# Patient Record
Sex: Female | Born: 1965
Health system: Southern US, Community
[De-identification: ages and names within clinical notes are randomized; demographics above are authoritative.]

## PROBLEM LIST (undated history)

## (undated) DIAGNOSIS — E7800 Pure hypercholesterolemia, unspecified: Secondary | ICD-10-CM

## (undated) DIAGNOSIS — R079 Chest pain, unspecified: Secondary | ICD-10-CM

## (undated) DIAGNOSIS — Z8739 Personal history of other diseases of the musculoskeletal system and connective tissue: Secondary | ICD-10-CM

## (undated) DIAGNOSIS — F419 Anxiety disorder, unspecified: Secondary | ICD-10-CM

## (undated) DIAGNOSIS — M542 Cervicalgia: Secondary | ICD-10-CM

## (undated) DIAGNOSIS — Z720 Tobacco use: Secondary | ICD-10-CM

## (undated) DIAGNOSIS — G8929 Other chronic pain: Secondary | ICD-10-CM

## (undated) DIAGNOSIS — F329 Major depressive disorder, single episode, unspecified: Secondary | ICD-10-CM

## (undated) DIAGNOSIS — F32A Depression, unspecified: Secondary | ICD-10-CM

## (undated) DIAGNOSIS — M5416 Radiculopathy, lumbar region: Secondary | ICD-10-CM

## (undated) DIAGNOSIS — M549 Dorsalgia, unspecified: Secondary | ICD-10-CM

## (undated) HISTORY — PX: BACK SURGERY: SHX140

## (undated) HISTORY — PX: TUBAL LIGATION: SHX77

## (undated) HISTORY — DX: Chest pain, unspecified: R07.9

---

## 1998-05-10 ENCOUNTER — Ambulatory Visit (HOSPITAL_COMMUNITY): Admission: RE | Admit: 1998-05-10 | Discharge: 1998-05-10 | Payer: Self-pay | Admitting: Gynecology

## 1999-08-05 ENCOUNTER — Other Ambulatory Visit: Admission: RE | Admit: 1999-08-05 | Discharge: 1999-08-05 | Payer: Self-pay | Admitting: Gynecology

## 2000-12-05 ENCOUNTER — Ambulatory Visit (HOSPITAL_COMMUNITY): Admission: RE | Admit: 2000-12-05 | Discharge: 2000-12-05 | Payer: Self-pay | Admitting: Neurosurgery

## 2002-06-10 ENCOUNTER — Encounter: Payer: Self-pay | Admitting: Emergency Medicine

## 2002-06-10 ENCOUNTER — Emergency Department (HOSPITAL_COMMUNITY): Admission: EM | Admit: 2002-06-10 | Discharge: 2002-06-10 | Payer: Self-pay | Admitting: Emergency Medicine

## 2002-10-05 ENCOUNTER — Encounter: Payer: Self-pay | Admitting: Family Medicine

## 2002-10-05 ENCOUNTER — Ambulatory Visit (HOSPITAL_COMMUNITY): Admission: RE | Admit: 2002-10-05 | Discharge: 2002-10-05 | Payer: Self-pay | Admitting: Family Medicine

## 2003-06-15 ENCOUNTER — Encounter: Payer: Self-pay | Admitting: Family Medicine

## 2003-06-15 ENCOUNTER — Ambulatory Visit (HOSPITAL_COMMUNITY): Admission: RE | Admit: 2003-06-15 | Discharge: 2003-06-15 | Payer: Self-pay | Admitting: Family Medicine

## 2003-06-19 ENCOUNTER — Other Ambulatory Visit: Admission: RE | Admit: 2003-06-19 | Discharge: 2003-06-19 | Payer: Self-pay | Admitting: Gynecology

## 2003-10-18 ENCOUNTER — Emergency Department (HOSPITAL_COMMUNITY): Admission: EM | Admit: 2003-10-18 | Discharge: 2003-10-18 | Payer: Self-pay | Admitting: Emergency Medicine

## 2004-02-04 ENCOUNTER — Emergency Department (HOSPITAL_COMMUNITY): Admission: EM | Admit: 2004-02-04 | Discharge: 2004-02-04 | Payer: Self-pay | Admitting: Emergency Medicine

## 2005-12-28 ENCOUNTER — Emergency Department (HOSPITAL_COMMUNITY): Admission: EM | Admit: 2005-12-28 | Discharge: 2005-12-28 | Payer: Self-pay | Admitting: Emergency Medicine

## 2006-12-28 ENCOUNTER — Ambulatory Visit (HOSPITAL_COMMUNITY): Admission: RE | Admit: 2006-12-28 | Discharge: 2006-12-28 | Payer: Self-pay | Admitting: Family Medicine

## 2007-11-11 ENCOUNTER — Ambulatory Visit (HOSPITAL_COMMUNITY): Admission: RE | Admit: 2007-11-11 | Discharge: 2007-11-11 | Payer: Self-pay | Admitting: Internal Medicine

## 2008-06-17 ENCOUNTER — Emergency Department (HOSPITAL_COMMUNITY): Admission: EM | Admit: 2008-06-17 | Discharge: 2008-06-17 | Payer: Self-pay | Admitting: Emergency Medicine

## 2008-06-25 ENCOUNTER — Emergency Department (HOSPITAL_COMMUNITY): Admission: EM | Admit: 2008-06-25 | Discharge: 2008-06-25 | Payer: Self-pay | Admitting: Emergency Medicine

## 2008-07-17 ENCOUNTER — Emergency Department (HOSPITAL_COMMUNITY): Admission: EM | Admit: 2008-07-17 | Discharge: 2008-07-17 | Payer: Self-pay | Admitting: Emergency Medicine

## 2008-07-25 ENCOUNTER — Emergency Department (HOSPITAL_COMMUNITY): Admission: EM | Admit: 2008-07-25 | Discharge: 2008-07-25 | Payer: Self-pay | Admitting: Emergency Medicine

## 2008-07-27 ENCOUNTER — Emergency Department (HOSPITAL_COMMUNITY): Admission: EM | Admit: 2008-07-27 | Discharge: 2008-07-27 | Payer: Self-pay | Admitting: Emergency Medicine

## 2008-07-31 ENCOUNTER — Ambulatory Visit (HOSPITAL_COMMUNITY): Admission: RE | Admit: 2008-07-31 | Discharge: 2008-07-31 | Payer: Self-pay | Admitting: Internal Medicine

## 2008-08-07 ENCOUNTER — Ambulatory Visit (HOSPITAL_COMMUNITY): Admission: RE | Admit: 2008-08-07 | Discharge: 2008-08-08 | Payer: Self-pay | Admitting: Neurological Surgery

## 2009-03-08 ENCOUNTER — Ambulatory Visit (HOSPITAL_COMMUNITY): Admission: RE | Admit: 2009-03-08 | Discharge: 2009-03-08 | Payer: Self-pay | Admitting: Obstetrics & Gynecology

## 2009-03-20 ENCOUNTER — Other Ambulatory Visit: Admission: RE | Admit: 2009-03-20 | Discharge: 2009-03-20 | Payer: Self-pay | Admitting: Obstetrics & Gynecology

## 2009-12-22 ENCOUNTER — Emergency Department (HOSPITAL_COMMUNITY): Admission: EM | Admit: 2009-12-22 | Discharge: 2009-12-22 | Payer: Self-pay | Admitting: Emergency Medicine

## 2010-03-24 ENCOUNTER — Emergency Department (HOSPITAL_COMMUNITY): Admission: EM | Admit: 2010-03-24 | Discharge: 2010-03-24 | Payer: Self-pay | Admitting: Emergency Medicine

## 2010-07-20 ENCOUNTER — Emergency Department (HOSPITAL_COMMUNITY): Admission: EM | Admit: 2010-07-20 | Discharge: 2010-07-20 | Payer: Self-pay | Admitting: Emergency Medicine

## 2010-11-15 LAB — URINE MICROSCOPIC-ADD ON

## 2010-11-15 LAB — URINALYSIS, ROUTINE W REFLEX MICROSCOPIC
Ketones, ur: NEGATIVE mg/dL
Nitrite: NEGATIVE
Protein, ur: NEGATIVE mg/dL
pH: 6 (ref 5.0–8.0)

## 2010-11-15 LAB — WET PREP, GENITAL: Trich, Wet Prep: NONE SEEN

## 2010-11-18 LAB — URINALYSIS, ROUTINE W REFLEX MICROSCOPIC
Protein, ur: >300 mg/dL — AB
Urobilinogen, UA: 1 mg/dL (ref 0.0–1.0)

## 2010-11-18 LAB — URINE CULTURE

## 2010-11-18 LAB — URINE MICROSCOPIC-ADD ON

## 2011-01-13 NOTE — Op Note (Signed)
NAMEMARGHERITA, [name] NO.:  [id number]   MEDICAL RECORD NO.:  [id number]          PATIENT TYPE:  OIB   LOCATION:  3538                         FACILITY:  [facility]   PHYSICIAN:  Stefani Dama, M.D.  DATE OF BIRTH:  Jan 16, 1966   DATE OF PROCEDURE:  08/07/2008  DATE OF DISCHARGE:                               OPERATIVE REPORT   PREOPERATIVE DIAGNOSIS:  Herniated nucleus pulposus L5-S1 right with  right lumbar radiculopathy.   POSTOPERATIVE DIAGNOSIS:  Herniated nucleus pulposus L5-S1 right with  right lumbar radiculopathy.   PROCEDURE:  Laminotomy L5, microdiskectomy L5-S1 with operating  microscope and microdissection technique.   SURGEON:  Stefani Dama, MD   ANESTHESIA:  General endotracheal.   INDICATIONS:  Katelyn Smith is a 45 year old individual who has had  significant back and right lower extremity pain.  She has a large  extruded fragment of disk at L5-S1 on the right side.  She has failed  efforts at conservative management and has been advised regarding the  diskectomy.   PROCEDURE:  The patient was brought to the operating room supine on the  stretcher.  After smooth induction of general endotracheal anesthesia,  she was turned prone.  Back was prepped with alcohol and DuraPrep and  draped in a sterile fashion.  Midline incision was created and carried  down to the lumbodorsal fascia which was opened on the right side.  A  needle localization of L5-S1 was obtained radiographically and after  this the lumbodorsal fascia was opened and the interlaminar space was  easily palpated at L5-S1.  Self-retaining retractor was then placed in  the wound and a laminotomy was created removing the inferior margin  lamina of L5 out to the medial wall of facet.  The L ligament was taken  up and common dural tube was exposed.  The takeoff of the S1 nerve root  was noted to be bowed dorsally and laterally and underneath this was a  significant mass.  Lateral  aspect of the nerve root was cleared and then  the nerve root could be mobilized.  The fascia that had formed from  dense adhesions was opened and a fragment of disk was extruded from this  area.  Several other fragments were then dissected free and removed and  this allowed good relaxation of the S1 nerve root.  Further dissection  yielded several other fragments of disk material and gradually the  length of the S1 nerve root could be palpated and was decompressed.  The  area of the disk space itself was then palpated and there was noted to  be a fascial closure over this area and the disk space itself could not  be entered.  Further exploration yielded no other fragments of disk and  the nerve root itself was explored both cephalad and caudad and no other  adhesions to the nerve root were identified.  With hemostasis being  achieved, the area was irrigated copiously with antibiotic irrigating  solution.  Microscope was removed from the  field and then the lumbodorsal fascia was closed with #1 Vicryl in  interrupted fashion, 2-0 Vicryl was used in the subcutaneous tissues, 3-  0 Vicryl subcuticularly and Dermabond was placed on the skin.  Blood  loss was nil for this procedure.  The patient tolerated the procedure  well was returned to recovery room in stable condition.      Stefani Dama, M.D.  Electronically Signed     HJE/MEDQ  D:  08/07/2008  T:  08/08/2008  Job:  [id number]

## 2011-06-05 LAB — CBC
MCHC: 35 g/dL (ref 30.0–36.0)
Platelets: 256 10*3/uL (ref 150–400)
RBC: 4.33 MIL/uL (ref 3.87–5.11)
WBC: 10.5 10*3/uL (ref 4.0–10.5)

## 2011-06-07 ENCOUNTER — Emergency Department (HOSPITAL_COMMUNITY): Payer: Self-pay

## 2011-06-07 ENCOUNTER — Encounter: Payer: Self-pay | Admitting: Emergency Medicine

## 2011-06-07 ENCOUNTER — Other Ambulatory Visit: Payer: Self-pay

## 2011-06-07 ENCOUNTER — Emergency Department (HOSPITAL_COMMUNITY)
Admission: EM | Admit: 2011-06-07 | Discharge: 2011-06-07 | Disposition: A | Discharge status: Home / Self Care | Payer: Self-pay | Attending: Emergency Medicine | Admitting: Emergency Medicine

## 2011-06-07 DIAGNOSIS — R079 Chest pain, unspecified: Secondary | ICD-10-CM | POA: Insufficient documentation

## 2011-06-07 DIAGNOSIS — M549 Dorsalgia, unspecified: Secondary | ICD-10-CM | POA: Insufficient documentation

## 2011-06-07 DIAGNOSIS — F17200 Nicotine dependence, unspecified, uncomplicated: Secondary | ICD-10-CM | POA: Insufficient documentation

## 2011-06-07 DIAGNOSIS — J189 Pneumonia, unspecified organism: Secondary | ICD-10-CM | POA: Insufficient documentation

## 2011-06-07 HISTORY — DX: Pure hypercholesterolemia, unspecified: E78.00

## 2011-06-07 HISTORY — DX: Depression, unspecified: F32.A

## 2011-06-07 HISTORY — DX: Major depressive disorder, single episode, unspecified: F32.9

## 2011-06-07 HISTORY — DX: Anxiety disorder, unspecified: F41.9

## 2011-06-07 LAB — CBC
HCT: 39.1 % (ref 36.0–46.0)
MCV: 97 fL (ref 78.0–100.0)
Platelets: 253 10*3/uL (ref 150–400)
RBC: 4.03 MIL/uL (ref 3.87–5.11)
WBC: 9.5 10*3/uL (ref 4.0–10.5)

## 2011-06-07 LAB — CARDIAC PANEL(CRET KIN+CKTOT+MB+TROPI)
CK, MB: 3.4 ng/mL (ref 0.3–4.0)
Relative Index: INVALID (ref 0.0–2.5)
Troponin I: <0.3 ng/mL (ref <0.30)

## 2011-06-07 LAB — COMPREHENSIVE METABOLIC PANEL
AST: 11 U/L (ref 0–37)
Albumin: 3.9 g/dL (ref 3.5–5.2)
Chloride: 102 mEq/L (ref 96–112)
Creatinine, Ser: 0.55 mg/dL (ref 0.50–1.10)
Total Bilirubin: 0.4 mg/dL (ref 0.3–1.2)
Total Protein: 6.6 g/dL (ref 6.0–8.3)

## 2011-06-07 LAB — D-DIMER, QUANTITATIVE: D-Dimer, Quant: 0.27 ug/mL-FEU (ref 0.00–0.48)

## 2011-06-07 MED ORDER — GI COCKTAIL ~~LOC~~
30.0000 mL | Freq: Once | ORAL | Status: AC
  Administered 2011-06-07: 30 mL via ORAL
  Filled 2011-06-07: qty 30

## 2011-06-07 MED ORDER — MORPHINE SULFATE 4 MG/ML IJ SOLN
4.0000 mg | Freq: Once | INTRAMUSCULAR | Status: AC
Start: 2011-06-07 — End: 2011-06-07
  Administered 2011-06-07: 4 mg via INTRAVENOUS
  Filled 2011-06-07: qty 1

## 2011-06-07 MED ORDER — MOXIFLOXACIN HCL 400 MG PO TABS: 400.0000 mg | ORAL_TABLET | Freq: Every day | ORAL | Status: DC

## 2011-06-07 MED ORDER — MORPHINE SULFATE 4 MG/ML IJ SOLN
4.0000 mg | Freq: Once | INTRAMUSCULAR | Status: AC
  Administered 2011-06-07: 4 mg via INTRAVENOUS
  Filled 2011-06-07: qty 1

## 2011-06-07 MED ORDER — MOXIFLOXACIN HCL 400 MG PO TABS: 400.0000 mg | ORAL_TABLET | Freq: Every day | ORAL | Status: AC

## 2011-06-07 NOTE — ED Notes (Signed)
Patient c/o chest pain. Per patient started with back pain x1 hour ago, pain moved into mid chest and radiates into left arm. Patient does report shortness of breath.

## 2011-06-07 NOTE — ED Provider Notes (Signed)
History     CSN: [id number] Arrival date & time: 06/07/2011  3:17 PM  Chief Complaint  Patient presents with  . Chest Pain    (Consider location/radiation/quality/duration/timing/severity/associated sxs/prior treatment) HPI The patient presents 2 hours after the onset of chest pain. The onset was insidious, and she initially felt sharp discomfort radiating from her sternum to her back. Since that time she's had pressure in this area.  She also complains of radiating discomfort to her left shoulder. The symptoms eased somewhat spontaneously, but she still has mild pain in the after mentioned areas. She describes some pain with deep inspiration, no pain with exertion, no lightheadedness, no vomiting, though she is nauseous. The patient is a smoker, and denies any recent travel or OTC use. Past Medical History  Diagnosis Date  . High cholesterol   . Anxiety   . Depression     Past Surgical History  Procedure Date  . Back surgery   . Tubal ligation     Family History  Problem Relation Age of Onset  . Heart failure Mother   . Cancer Father   . Cancer Sister     History  Substance Use Topics  . Smoking status: Current Everyday Smoker -- 0.5 packs/day for 30 years    Types: Cigarettes  . Smokeless tobacco: Never Used  . Alcohol Use: No    OB History    Grav Para Term Preterm Abortions TAB SAB Ect Mult Living   3 3 3       3       Review of Systems Gen: Per HPI HEENT: No HA CV:hpi Resp: No dyspnea Abd: Per HPI, otherwise negative Musk: Per HPI, otherwise negative Neuro: No dysesthesia, or focal changes GU: Per HPI, otherwise negative Skin: Neg Psych: Neg  Allergies  Review of patient's allergies indicates no known allergies.  Home Medications   Current Outpatient Rx  Name Route Sig Dispense Refill  . ALPRAZOLAM 1 MG PO TABS Oral Take 1 mg by mouth 2 (two) times daily.      Marland Kitchen CLONAZEPAM 1 MG PO TABS Oral Take 1 mg by mouth at bedtime.      Marland Kitchen ROPINIROLE HCL  0.25 MG PO TABS Oral Take 0.25 mg by mouth at bedtime.        BP 123/68  Pulse 55  Temp(Src) 98 F (36.7 C) (Oral)  Resp 17  Ht 5\' 3"  (1.6 m)  Wt 145 lb (65.772 kg)  BMI 25.69 kg/m2  SpO2 95%  Physical Exam  Constitutional: She is oriented to person, place, and time. She appears well-developed and well-nourished.  HENT:  Head: Normocephalic and atraumatic.  Eyes: EOM are normal.  Cardiovascular: Normal rate and regular rhythm.   Pulmonary/Chest: Effort normal and breath sounds normal.  Abdominal: She exhibits no distension.  Musculoskeletal: She exhibits no edema and no tenderness.  Neurological: She is alert and oriented to person, place, and time.  Skin: Skin is warm and dry.    ED Course  Procedures (including critical care time)   Labs Reviewed  CBC  CARDIAC PANEL(CRET KIN+CKTOT+MB+TROPI)  COMPREHENSIVE METABOLIC PANEL  MAGNESIUM  D-DIMER, QUANTITATIVE   No results found.   No diagnosis found.   Date: 06/07/2011  Rate: 65   Rhythm: normal sinus rhythm  QRS Axis: normal  Intervals: normal  ST/T Wave abnormalities: normal  Conduction Disutrbances:none  Narrative Interpretation:   Old EKG Reviewed: none available  CXR: RLL opacification c/w PNA    MDM  45yo F p/w  CP and back pain. Concern for ACS, though this would have been an atypical presentation.  ED eval demonstrates RLL PNA.  Patient d/c home w Moxifloxacin x10d        Gerhard Munch, MD 06/07/11 2102

## 2011-06-07 NOTE — ED Notes (Signed)
Pt a/ox4. Resp even and unlabored. NAD at this time. D/C instructions and Rx x1 reviewed with pt. Pt verbalized understanding. Pt ambulated with steady gate to lobby with husband to transport home.

## 2012-08-27 ENCOUNTER — Encounter (HOSPITAL_COMMUNITY): Payer: Self-pay

## 2012-08-27 ENCOUNTER — Emergency Department (HOSPITAL_COMMUNITY)
Admission: EM | Admit: 2012-08-27 | Discharge: 2012-08-27 | Disposition: A | Discharge status: Home / Self Care | Payer: Self-pay | Attending: Emergency Medicine | Admitting: Emergency Medicine

## 2012-08-27 DIAGNOSIS — Z8659 Personal history of other mental and behavioral disorders: Secondary | ICD-10-CM | POA: Insufficient documentation

## 2012-08-27 DIAGNOSIS — Z760 Encounter for issue of repeat prescription: Secondary | ICD-10-CM | POA: Insufficient documentation

## 2012-08-27 DIAGNOSIS — H9209 Otalgia, unspecified ear: Secondary | ICD-10-CM | POA: Insufficient documentation

## 2012-08-27 DIAGNOSIS — E7800 Pure hypercholesterolemia, unspecified: Secondary | ICD-10-CM | POA: Insufficient documentation

## 2012-08-27 DIAGNOSIS — F17200 Nicotine dependence, unspecified, uncomplicated: Secondary | ICD-10-CM | POA: Insufficient documentation

## 2012-08-27 DIAGNOSIS — G542 Cervical root disorders, not elsewhere classified: Secondary | ICD-10-CM | POA: Insufficient documentation

## 2012-08-27 DIAGNOSIS — G8929 Other chronic pain: Secondary | ICD-10-CM | POA: Insufficient documentation

## 2012-08-27 DIAGNOSIS — H9201 Otalgia, right ear: Secondary | ICD-10-CM

## 2012-08-27 DIAGNOSIS — M542 Cervicalgia: Secondary | ICD-10-CM | POA: Insufficient documentation

## 2012-08-27 HISTORY — DX: Other chronic pain: G89.29

## 2012-08-27 HISTORY — DX: Cervicalgia: M54.2

## 2012-08-27 MED ORDER — OXYCODONE-ACETAMINOPHEN 5-325 MG PO TABS: 1.0000 | ORAL_TABLET | ORAL | Status: AC | PRN

## 2012-08-27 MED ORDER — ANTIPYRINE-BENZOCAINE 5.4-1.4 % OT SOLN
3.0000 [drp] | Freq: Once | OTIC | Status: AC
  Administered 2012-08-27: 4 [drp] via OTIC
  Filled 2012-08-27: qty 10

## 2012-08-27 NOTE — ED Notes (Signed)
Right earache that started last night Also out of her meds for chronic neck pain. (usually takes prednisone and percocet)

## 2012-08-28 NOTE — ED Provider Notes (Signed)
History     CSN: [id number]  Arrival date & time 08/27/12  4098   First MD Initiated Contact with Patient 08/27/12 [phone]      Chief Complaint  Patient presents with  . Otalgia  . Medication Refill    (Consider location/radiation/quality/duration/timing/severity/associated sxs/prior treatment) HPI Comments: Tesia L Graffius presents with right earache which started last night, has been constant, aching and without radiation. She denies fevers, chills, decreased hearing acuity and dizziness.  Additionally there has been no drainage from her ear and she denies nasal congestion , drainage and sore throat pain.  She does have a history of chronic neck pain with known cervical disk disease which has flared up over the past several days.  She denies any new injury and has had no  Weakness but reports tingling in her left forearm. The tingling is not a new finding.  She is followed by Dr. Danielle Dess for this condition.  The history is provided by the patient.    Past Medical History  Diagnosis Date  . High cholesterol   . Anxiety   . Depression   . Chronic neck pain     Past Surgical History  Procedure Date  . Back surgery   . Tubal ligation     Family History  Problem Relation Age of Onset  . Heart failure Mother   . Cancer Father   . Cancer Sister     History  Substance Use Topics  . Smoking status: Current Every Day Smoker -- 0.5 packs/day for 30 years    Types: Cigarettes  . Smokeless tobacco: Never Used  . Alcohol Use: No    OB History    Grav Para Term Preterm Abortions TAB SAB Ect Mult Living   3 3 3       3       Review of Systems  Constitutional: Negative for fever.  HENT: Positive for ear pain and neck pain. Negative for hearing loss, congestion, sore throat, neck stiffness, sinus pressure and tinnitus.   Eyes: Negative.   Respiratory: Negative for chest tightness and shortness of breath.   Cardiovascular: Negative for chest pain.  Gastrointestinal: Negative  for nausea and abdominal pain.  Genitourinary: Negative.   Musculoskeletal: Negative for joint swelling and arthralgias.  Skin: Negative.  Negative for rash and wound.  Neurological: Negative for dizziness, weakness, light-headedness, numbness and headaches.  Hematological: Negative.   Psychiatric/Behavioral: Negative.     Allergies  Review of patient's allergies indicates no known allergies.  Home Medications   Current Outpatient Rx  Name  Route  Sig  Dispense  Refill  . IBUPROFEN 200 MG PO TABS   Oral   Take 800 mg by mouth every 4 (four) hours as needed.         . OXYCODONE-ACETAMINOPHEN 5-325 MG PO TABS   Oral   Take 1 tablet by mouth every 4 (four) hours as needed for pain.   20 tablet   0     BP 146/80  Pulse 76  Temp 97.9 F (36.6 C) (Oral)  Resp 20  SpO2 100%  Physical Exam  Nursing note and vitals reviewed. Constitutional: She appears well-developed and well-nourished.  HENT:  Head: Normocephalic and atraumatic.  Right Ear: Tympanic membrane and ear canal normal. No mastoid tenderness.  Left Ear: Tympanic membrane and ear canal normal. No mastoid tenderness.  Mouth/Throat: Oropharynx is clear and moist and mucous membranes are normal.  Eyes: Conjunctivae normal are normal.  Neck: Normal range of motion.  Muscular tenderness present. No spinous process tenderness present. No rigidity. No edema and no erythema present.  Cardiovascular: Normal rate, regular rhythm, normal heart sounds and intact distal pulses.   Pulmonary/Chest: Effort normal and breath sounds normal. She has no wheezes.  Abdominal: Soft. Bowel sounds are normal. There is no tenderness.  Musculoskeletal: Normal range of motion.  Neurological: She is alert.  Skin: Skin is warm and dry.  Psychiatric: She has a normal mood and affect.    ED Course  Procedures (including critical care time)  Labs Reviewed - No data to display No results found.   1. Earache on right   2. Medication  refill   3. Cervical neuropathy       MDM  No neuro deficit on exam or by history to suggest emergent or surgical presentation.  Also discussed worsened sx that should prompt immediate re-evaluation including distal weakness, bowel/bladder retention/incontinence.  Prescribed oxycodone.  Encouraged recheck with Dr Danielle Dess prn  Ear exam unremarkable.  Pt was given auralgan in right ear with improvement in pain.  Normal exam,  F/u with pcp if not improved.              Burgess Amor, [place] 08/30/12 [phone]

## 2012-08-30 NOTE — ED Provider Notes (Signed)
Medical screening examination/treatment/procedure(s) were performed by non-physician practitioner and as supervising physician I was immediately available for consultation/collaboration.  Aaleigha Bozza, MD 08/30/12 1358 

## 2012-11-14 ENCOUNTER — Other Ambulatory Visit: Payer: Self-pay | Admitting: Obstetrics & Gynecology

## 2012-11-14 DIAGNOSIS — Z139 Encounter for screening, unspecified: Secondary | ICD-10-CM

## 2012-11-15 ENCOUNTER — Ambulatory Visit (HOSPITAL_COMMUNITY)
Admission: RE | Admit: 2012-11-15 | Discharge: 2012-11-15 | Disposition: A | Discharge status: Home / Self Care | Payer: BC Managed Care – PPO | Source: Ambulatory Visit | Attending: Obstetrics & Gynecology | Admitting: Obstetrics & Gynecology

## 2012-11-15 DIAGNOSIS — Z139 Encounter for screening, unspecified: Secondary | ICD-10-CM

## 2012-11-15 DIAGNOSIS — Z1231 Encounter for screening mammogram for malignant neoplasm of breast: Secondary | ICD-10-CM | POA: Insufficient documentation

## 2012-12-14 ENCOUNTER — Encounter: Payer: Self-pay | Admitting: *Deleted

## 2012-12-15 ENCOUNTER — Telehealth: Payer: Self-pay | Admitting: *Deleted

## 2012-12-15 ENCOUNTER — Encounter: Payer: Self-pay | Admitting: Obstetrics & Gynecology

## 2012-12-15 ENCOUNTER — Ambulatory Visit (INDEPENDENT_AMBULATORY_CARE_PROVIDER_SITE_OTHER): Payer: BC Managed Care – PPO | Admitting: Obstetrics & Gynecology

## 2012-12-15 VITALS — BP 110/80 | Wt 145.0 lb

## 2012-12-15 DIAGNOSIS — N3941 Urge incontinence: Secondary | ICD-10-CM | POA: Insufficient documentation

## 2012-12-15 DIAGNOSIS — Z1389 Encounter for screening for other disorder: Secondary | ICD-10-CM

## 2012-12-15 DIAGNOSIS — M545 Other chronic pain: Secondary | ICD-10-CM | POA: Insufficient documentation

## 2012-12-15 DIAGNOSIS — G8929 Other chronic pain: Secondary | ICD-10-CM | POA: Insufficient documentation

## 2012-12-15 LAB — POCT URINALYSIS DIPSTICK
Glucose, UA: NEGATIVE
Ketones, UA: NEGATIVE

## 2012-12-15 MED ORDER — CYCLOBENZAPRINE HCL 10 MG PO TABS: 10.0000 mg | ORAL_TABLET | Freq: Three times a day (TID) | ORAL | Status: DC | PRN

## 2012-12-15 MED ORDER — MIRABEGRON ER 25 MG PO TB24: 25.0000 mg | ORAL_TABLET | Freq: Every day | ORAL | Status: DC

## 2012-12-15 NOTE — Telephone Encounter (Signed)
Pt states Myrbetriq is $233.00.  Can you prescribe something else?

## 2012-12-15 NOTE — Progress Notes (Signed)
Patient ID: Katelyn Smith, female   DOB: July 12, 1966, 47 y.o.   MRN: [id number] Follow up: Patient doing very well on Myrbetriq  "100% better" Keep on 25 mg perday [place] up in 6 months  Also with low back pain  Exam +triggers in area of posterior supreior ischial spine bilaterally Rx  Flexeril prn

## 2012-12-15 NOTE — Patient Instructions (Addendum)
Urinary Incontinence Your doctor wants you to have this information about urinary incontinence. This is the inability to keep urine in your body until you decide to release it. CAUSES  Prostate gland enlargement is a common cause of urinary incontinence. But there are many different causes for losing urinary control. They include:  Medicines.  Infections.  Prostate problems.  Surgery.  Neurological diseases.  Emotional factors. DIAGNOSIS  Evaluating the cause of incontinence is important in choosing the best treatment. This may require:  An ultrasound exam.  Kidney and bladder X-rays.  Cystoscopy. This is an exam of the bladder using a narrow scope. TREATMENT  For incontinent patients, normal daily hygiene and using changing pads or adult diapers regularly will prevent offensive odors and skin damage from the moisture. Changing your medicines may help control incontinence. Your caregiver may prescribe some medicines to help you regain control. Avoid caffeine. It can over-stimulate the bladder. Use the bathroom regularly. Try about every 2 to 3 hours even if you do not feel the need. Take time to empty your bladder completely. After urinating, wait a minute. Then try to urinate again. External devices used to catch urine or an indwelling urine catheter (Foley catheter) may be needed as well. Some prostate gland problems require surgery to correct. Call your caregiver for more information. Document Released: 09/24/2004 Document Revised: 11/09/2011 Document Reviewed: 09/19/2008 Exit[employer] Patient Information 2013 [employer].  

## 2012-12-19 ENCOUNTER — Other Ambulatory Visit: Payer: Self-pay | Admitting: *Deleted

## 2012-12-19 ENCOUNTER — Encounter: Payer: Self-pay | Admitting: *Deleted

## 2012-12-19 NOTE — Telephone Encounter (Signed)
Number of samples 4  Lot number [id number]    Exp date 01/2014 myrbetriq samples given because could not afford

## 2012-12-20 MED ORDER — MIRABEGRON ER 25 MG PO TB24: 25.0000 mg | ORAL_TABLET | Freq: Every day | ORAL | Status: DC

## 2012-12-20 MED ORDER — SOLIFENACIN SUCCINATE 10 MG PO TABS: 10.0000 mg | ORAL_TABLET | Freq: Every day | ORAL | Status: DC

## 2012-12-20 NOTE — Telephone Encounter (Signed)
Pt informed Vesicare 10 mg e-scribed.

## 2013-04-10 ENCOUNTER — Telehealth: Payer: Self-pay | Admitting: Obstetrics & Gynecology

## 2013-04-11 MED ORDER — OXYBUTYNIN CHLORIDE ER 10 MG PO TB24: 10.0000 mg | ORAL_TABLET | Freq: Every day | ORAL | Status: DC

## 2013-04-11 NOTE — Addendum Note (Signed)
Addended by: Lazaro Arms on: 04/11/2013 02:47 PM   Modules accepted: Orders

## 2013-04-11 NOTE — Telephone Encounter (Signed)
Spoke with pt. She never got Myrbetriq filled because it was so expensive, over $200.00 for a 30 day supply. The Vesicare is $58.00 and that is to expensive also. Is there something else that's not so expensive? Uses [facility]. Thanks!!!

## 2013-04-12 NOTE — Telephone Encounter (Signed)
Spoke with pt. Advised to try Ditropan XL per Dr. Despina Hidden. That is his last option, and it has been sent to the pharmacy. JSY

## 2013-04-17 ENCOUNTER — Telehealth: Payer: Self-pay | Admitting: Obstetrics & Gynecology

## 2013-04-19 NOTE — Telephone Encounter (Signed)
Pt states pharmacy never received order for Ditropan XL 10 mg 24 hr tab # 30 ok X 11 refills per Dr. Despina Hidden. I called med into [place]. Pt aware. JSY

## 2013-04-24 ENCOUNTER — Emergency Department (HOSPITAL_COMMUNITY)
Admission: EM | Admit: 2013-04-24 | Discharge: 2013-04-24 | Disposition: A | Discharge status: Home / Self Care | Payer: BC Managed Care – PPO | Attending: Emergency Medicine | Admitting: Emergency Medicine

## 2013-04-24 ENCOUNTER — Encounter (HOSPITAL_COMMUNITY): Payer: Self-pay | Admitting: *Deleted

## 2013-04-24 DIAGNOSIS — Z8659 Personal history of other mental and behavioral disorders: Secondary | ICD-10-CM | POA: Insufficient documentation

## 2013-04-24 DIAGNOSIS — K0889 Other specified disorders of teeth and supporting structures: Secondary | ICD-10-CM

## 2013-04-24 DIAGNOSIS — Z8639 Personal history of other endocrine, nutritional and metabolic disease: Secondary | ICD-10-CM | POA: Insufficient documentation

## 2013-04-24 DIAGNOSIS — Z862 Personal history of diseases of the blood and blood-forming organs and certain disorders involving the immune mechanism: Secondary | ICD-10-CM | POA: Insufficient documentation

## 2013-04-24 DIAGNOSIS — K089 Disorder of teeth and supporting structures, unspecified: Secondary | ICD-10-CM | POA: Insufficient documentation

## 2013-04-24 DIAGNOSIS — K029 Dental caries, unspecified: Secondary | ICD-10-CM | POA: Insufficient documentation

## 2013-04-24 DIAGNOSIS — M542 Cervicalgia: Secondary | ICD-10-CM | POA: Insufficient documentation

## 2013-04-24 DIAGNOSIS — Z79899 Other long term (current) drug therapy: Secondary | ICD-10-CM | POA: Insufficient documentation

## 2013-04-24 DIAGNOSIS — F17200 Nicotine dependence, unspecified, uncomplicated: Secondary | ICD-10-CM | POA: Insufficient documentation

## 2013-04-24 DIAGNOSIS — G8929 Other chronic pain: Secondary | ICD-10-CM | POA: Insufficient documentation

## 2013-04-24 DIAGNOSIS — F411 Generalized anxiety disorder: Secondary | ICD-10-CM | POA: Insufficient documentation

## 2013-04-24 MED ORDER — ONDANSETRON HCL 4 MG PO TABS
4.0000 mg | ORAL_TABLET | Freq: Once | ORAL | Status: AC
  Administered 2013-04-24: 4 mg via ORAL
  Filled 2013-04-24: qty 1

## 2013-04-24 MED ORDER — TRAMADOL HCL 50 MG PO TABS
50.0000 mg | ORAL_TABLET | Freq: Once | ORAL | Status: AC
  Administered 2013-04-24: 50 mg via ORAL
  Filled 2013-04-24: qty 1

## 2013-04-24 MED ORDER — AMOXICILLIN 500 MG PO CAPS: 500.0000 mg | ORAL_CAPSULE | Freq: Three times a day (TID) | ORAL | Status: DC

## 2013-04-24 MED ORDER — KETOROLAC TROMETHAMINE 10 MG PO TABS
10.0000 mg | ORAL_TABLET | Freq: Once | ORAL | Status: AC
  Administered 2013-04-24: 10 mg via ORAL
  Filled 2013-04-24: qty 1

## 2013-04-24 MED ORDER — MELOXICAM 7.5 MG PO TABS: ORAL_TABLET | ORAL | Status: DC

## 2013-04-24 MED ORDER — AMOXICILLIN 250 MG PO CAPS
500.0000 mg | ORAL_CAPSULE | Freq: Once | ORAL | Status: AC
  Administered 2013-04-24: 500 mg via ORAL
  Filled 2013-04-24: qty 2

## 2013-04-24 MED ORDER — TRAMADOL HCL 50 MG PO TABS: 50.0000 mg | ORAL_TABLET | Freq: Four times a day (QID) | ORAL | Status: DC | PRN

## 2013-04-24 NOTE — ED Provider Notes (Signed)
CSN: [id number]     Arrival date & time 04/24/13  2236 History   First MD Initiated Contact with Patient 04/24/13 2245     Chief Complaint  Patient presents with  . Dental Pain   (Consider location/radiation/quality/duration/timing/severity/associated sxs/prior Treatment) Patient is a 47 y.o. female presenting with tooth pain. The history is provided by the patient.  Dental Pain Severity:  Moderate Onset quality:  Gradual Duration:  2 weeks Timing:  Intermittent Progression:  Worsening Chronicity:  Chronic Context: dental caries   Context: not abscess   Relieved by:  Nothing Ineffective treatments:  Acetaminophen Associated symptoms: neck pain   Associated symptoms: no difficulty swallowing and no fever   Risk factors: smoking     Past Medical History  Diagnosis Date  . High cholesterol   . Anxiety   . Depression   . Chronic neck pain    Past Surgical History  Procedure Laterality Date  . Back surgery    . Tubal ligation     Family History  Problem Relation Age of Onset  . Heart failure Mother   . Cancer Father   . Cancer Sister   . Stroke Other   . Heart disease Other    History  Substance Use Topics  . Smoking status: Current Every Day Smoker -- 0.50 packs/day for 30 years    Types: Cigarettes  . Smokeless tobacco: Never Used  . Alcohol Use: No   OB History   Grav Para Term Preterm Abortions TAB SAB Ect Mult Living   3 3 3       3      Review of Systems  Constitutional: Negative for fever and activity change.       All ROS Neg except as noted in HPI  HENT: Positive for neck pain. Negative for nosebleeds.   Eyes: Negative for photophobia and discharge.  Respiratory: Negative for cough, shortness of breath and wheezing.   Cardiovascular: Negative for chest pain and palpitations.  Gastrointestinal: Negative for abdominal pain and blood in stool.  Genitourinary: Negative for dysuria, frequency and hematuria.  Musculoskeletal: Negative for back pain and  arthralgias.  Skin: Negative.   Neurological: Negative for dizziness, seizures and speech difficulty.  Psychiatric/Behavioral: Negative for hallucinations and confusion. The patient is nervous/anxious.     Allergies  Review of patient's allergies indicates no known allergies.  Home Medications   Current Outpatient Rx  Name  Route  Sig  Dispense  Refill  . acetaminophen (TYLENOL) 500 MG tablet   Oral   Take 500-1,000 mg by mouth daily as needed for pain.         Marland Kitchen oxybutynin (DITROPAN XL) 10 MG 24 hr tablet   Oral   Take 1 tablet (10 mg total) by mouth daily.   30 tablet   11   . Probiotic Product (PROBIOTIC DAILY PO)   Oral   Take 1 tablet by mouth every evening.         . zolpidem (AMBIEN) 10 MG tablet   Oral   Take 10 mg by mouth at bedtime.          BP 152/78  Pulse 68  Temp(Src) 98.4 F (36.9 C) (Oral)  Resp 20  Ht 5\' 3"  (1.6 m)  Wt 145 lb (65.772 kg)  BMI 25.69 kg/m2  SpO2 97% Physical Exam  Nursing note and vitals reviewed. Constitutional: She is oriented to person, place, and time. She appears well-developed and well-nourished.  Non-toxic appearance.  HENT:  Head: Normocephalic.  Right Ear: Tympanic membrane and external ear normal.  Left Ear: Tympanic membrane and external ear normal.  Multiple dental caries, right more than left. Mild to mod swelling of upper gum. Airway patent. No swelling under the tongue.  Eyes: EOM and lids are normal. Pupils are equal, round, and reactive to light.  Neck: Normal range of motion. Neck supple. Carotid bruit is not present.  Cardiovascular: Normal rate, regular rhythm, normal heart sounds, intact distal pulses and normal pulses.   Pulmonary/Chest: Breath sounds normal. No respiratory distress.  Abdominal: Soft. Bowel sounds are normal. There is no tenderness. There is no guarding.  Musculoskeletal: Normal range of motion.  Lymphadenopathy:       Head (right side): No submandibular adenopathy present.        Head (left side): No submandibular adenopathy present.    She has no cervical adenopathy.  Neurological: She is alert and oriented to person, place, and time. She has normal strength. No cranial nerve deficit or sensory deficit.  Skin: Skin is warm and dry.  Psychiatric: She has a normal mood and affect. Her speech is normal.    ED Course  Procedures (including critical care time) Labs Review Labs Reviewed - No data to display Imaging Review No results found.  MDM  No diagnosis found. **I have reviewed nursing notes, vital signs, and all appropriate lab and imaging results for this patient.*  Pt advised to see a dentist as soon as possible. Rx for amoxil, tramadol, and mobic given to the patient. Dental resource packet given to the patient.  Kathie Dike, PA-C 04/24/13 2335

## 2013-04-24 NOTE — ED Provider Notes (Signed)
Medical screening examination/treatment/procedure(s) were performed by non-physician practitioner and as supervising physician I was immediately available for consultation/collaboration.  Geoffery Lyons, MD 04/24/13 [phone]

## 2013-04-24 NOTE — ED Notes (Signed)
rt jaw swelling and pain, thinks she may have a dental problem

## 2013-04-24 NOTE — ED Notes (Signed)
Pt alert & oriented x4, stable gait. Patient given discharge instructions, paperwork & prescription(s). Patient  instructed to stop at the registration desk to finish any additional paperwork. Patient verbalized understanding. Pt left department w/ no further questions. 

## 2013-04-24 NOTE — ED Notes (Signed)
PA at bedside.

## 2014-06-26 ENCOUNTER — Encounter (HOSPITAL_COMMUNITY): Payer: Self-pay | Admitting: Emergency Medicine

## 2014-06-26 ENCOUNTER — Emergency Department (HOSPITAL_COMMUNITY)
Admission: EM | Admit: 2014-06-26 | Discharge: 2014-06-27 | Disposition: A | Discharge status: Home / Self Care | Payer: BC Managed Care – PPO | Attending: Emergency Medicine | Admitting: Emergency Medicine

## 2014-06-26 DIAGNOSIS — G8929 Other chronic pain: Secondary | ICD-10-CM | POA: Insufficient documentation

## 2014-06-26 DIAGNOSIS — Z79899 Other long term (current) drug therapy: Secondary | ICD-10-CM | POA: Diagnosis not present

## 2014-06-26 DIAGNOSIS — Z791 Long term (current) use of non-steroidal anti-inflammatories (NSAID): Secondary | ICD-10-CM | POA: Insufficient documentation

## 2014-06-26 DIAGNOSIS — R091 Pleurisy: Secondary | ICD-10-CM | POA: Diagnosis present

## 2014-06-26 DIAGNOSIS — E780 Pure hypercholesterolemia: Secondary | ICD-10-CM | POA: Insufficient documentation

## 2014-06-26 DIAGNOSIS — F329 Major depressive disorder, single episode, unspecified: Secondary | ICD-10-CM | POA: Insufficient documentation

## 2014-06-26 DIAGNOSIS — F419 Anxiety disorder, unspecified: Secondary | ICD-10-CM | POA: Insufficient documentation

## 2014-06-26 DIAGNOSIS — R05 Cough: Secondary | ICD-10-CM | POA: Diagnosis not present

## 2014-06-26 DIAGNOSIS — R0781 Pleurodynia: Secondary | ICD-10-CM

## 2014-06-26 DIAGNOSIS — R0789 Other chest pain: Secondary | ICD-10-CM | POA: Diagnosis not present

## 2014-06-26 DIAGNOSIS — R062 Wheezing: Secondary | ICD-10-CM | POA: Diagnosis not present

## 2014-06-26 DIAGNOSIS — R079 Chest pain, unspecified: Secondary | ICD-10-CM

## 2014-06-26 DIAGNOSIS — Z792 Long term (current) use of antibiotics: Secondary | ICD-10-CM | POA: Diagnosis not present

## 2014-06-26 DIAGNOSIS — J189 Pneumonia, unspecified organism: Secondary | ICD-10-CM

## 2014-06-26 NOTE — ED Notes (Signed)
Having chest pain that started yesterday and got worse. States that it hurts to take a deep breath and that she had to sleep in the chair because it hurt to lay down.

## 2014-06-27 ENCOUNTER — Emergency Department (HOSPITAL_COMMUNITY): Payer: BC Managed Care – PPO

## 2014-06-27 LAB — D-DIMER, QUANTITATIVE (NOT AT ARMC): D DIMER QUANT: 0.73 ug{FEU}/mL — AB (ref 0.00–0.48)

## 2014-06-27 LAB — CBC WITH DIFFERENTIAL/PLATELET
BASOS ABS: 0 10*3/uL (ref 0.0–0.1)
BASOS PCT: 0 % (ref 0–1)
EOS ABS: 0.1 10*3/uL (ref 0.0–0.7)
Eosinophils Relative: 2 % (ref 0–5)
HCT: 33.5 % — ABNORMAL LOW (ref 36.0–46.0)
Hemoglobin: 11.6 g/dL — ABNORMAL LOW (ref 12.0–15.0)
Lymphocytes Relative: 39 % (ref 12–46)
Lymphs Abs: 2.8 10*3/uL (ref 0.7–4.0)
MCH: 33.4 pg (ref 26.0–34.0)
MCHC: 34.6 g/dL (ref 30.0–36.0)
MCV: 96.5 fL (ref 78.0–100.0)
MONOS PCT: 6 % (ref 3–12)
Monocytes Absolute: 0.4 10*3/uL (ref 0.1–1.0)
NEUTROS ABS: 3.9 10*3/uL (ref 1.7–7.7)
NEUTROS PCT: 53 % (ref 43–77)
PLATELETS: 237 10*3/uL (ref 150–400)
RBC: 3.47 MIL/uL — ABNORMAL LOW (ref 3.87–5.11)
RDW: 13.3 % (ref 11.5–15.5)
WBC: 7.3 10*3/uL (ref 4.0–10.5)

## 2014-06-27 LAB — BASIC METABOLIC PANEL
ANION GAP: 11 (ref 5–15)
BUN: 10 mg/dL (ref 6–23)
CHLORIDE: 103 meq/L (ref 96–112)
CO2: 26 mEq/L (ref 19–32)
Calcium: 9.4 mg/dL (ref 8.4–10.5)
Creatinine, Ser: 0.59 mg/dL (ref 0.50–1.10)
Glucose, Bld: 106 mg/dL — ABNORMAL HIGH (ref 70–99)
POTASSIUM: 3.6 meq/L — AB (ref 3.7–5.3)
Sodium: 140 mEq/L (ref 137–147)

## 2014-06-27 LAB — TROPONIN I: Troponin I: <0.3 ng/mL (ref <0.30)

## 2014-06-27 MED ORDER — HYDROCODONE-ACETAMINOPHEN 5-325 MG PO TABS
1.0000 | ORAL_TABLET | Freq: Once | ORAL | Status: DC
  Filled 2014-06-27: qty 1

## 2014-06-27 MED ORDER — KETOROLAC TROMETHAMINE 30 MG/ML IJ SOLN
30.0000 mg | Freq: Once | INTRAMUSCULAR | Status: AC
  Administered 2014-06-27: 30 mg via INTRAVENOUS
  Filled 2014-06-27: qty 1

## 2014-06-27 MED ORDER — ACETAMINOPHEN 325 MG PO TABS
650.0000 mg | ORAL_TABLET | Freq: Once | ORAL | Status: AC
  Administered 2014-06-27: 650 mg via ORAL
  Filled 2014-06-27: qty 2

## 2014-06-27 MED ORDER — LEVOFLOXACIN 500 MG PO TABS: 500.0000 mg | ORAL_TABLET | Freq: Every day | ORAL | Status: DC

## 2014-06-27 MED ORDER — ALBUTEROL SULFATE HFA 108 (90 BASE) MCG/ACT IN AERS: 2.0000 | INHALATION_SPRAY | RESPIRATORY_TRACT | Status: DC | PRN

## 2014-06-27 MED ORDER — IOHEXOL 350 MG/ML SOLN
100.0000 mL | Freq: Once | INTRAVENOUS | Status: AC | PRN
  Administered 2014-06-27: 100 mL via INTRAVENOUS

## 2014-06-27 MED ORDER — HYDROCODONE-ACETAMINOPHEN 5-325 MG PO TABS: 1.0000 | ORAL_TABLET | Freq: Four times a day (QID) | ORAL | Status: DC | PRN

## 2014-06-27 NOTE — ED Provider Notes (Signed)
CSN: [id number]     Arrival date & time 06/26/14  2336 History   First MD Initiated Contact with Patient 06/27/14 0001     Chief Complaint  Patient presents with  . Pleurisy     (Consider location/radiation/quality/duration/timing/severity/associated sxs/prior Treatment) HPI  This is a 48 year old female with a history of hyperlipidemia and current smoking who presents with chest pain. Patient reports onset of chest pain yesterday. She states that it is constant. She describes it as achy and sharp. The intensity waxes and wanes is worse with breathing and coughing. Patient reports a nonproductive cough. Currently her pain is 8 out of 10. Patient denies any recent travel, history of blood clots, leg swelling, or recent hospitalization/surgery. She denies any fevers.  Past Medical History  Diagnosis Date  . High cholesterol   . Anxiety   . Depression   . Chronic neck pain    Past Surgical History  Procedure Laterality Date  . Back surgery    . Tubal ligation     Family History  Problem Relation Age of Onset  . Heart failure Mother   . Cancer Father   . Cancer Sister   . Stroke Other   . Heart disease Other    History  Substance Use Topics  . Smoking status: Current Every Day Smoker -- 0.50 packs/day for 30 years    Types: Cigarettes  . Smokeless tobacco: Never Used  . Alcohol Use: No   OB History   Grav Para Term Preterm Abortions TAB SAB Ect Mult Living   3 3 3       3      Review of Systems  Constitutional: Negative for fever.  Respiratory: Positive for cough and chest tightness. Negative for shortness of breath.   Cardiovascular: Positive for chest pain. Negative for leg swelling.  Gastrointestinal: Negative for nausea, vomiting and abdominal pain.  Genitourinary: Negative for dysuria.  Musculoskeletal: Negative for back pain.  Skin: Negative for wound.  Neurological: Negative for headaches.  Psychiatric/Behavioral: Negative for confusion.  All other systems  reviewed and are negative.     Allergies  Review of patient's allergies indicates no known allergies.  Home Medications   Prior to Admission medications   Medication Sig Start Date End Date Taking? Authorizing Provider  acetaminophen (TYLENOL) 500 MG tablet Take 500-1,000 mg by mouth daily as needed for pain.   Yes Historical Provider, MD  mirtazapine (REMERON) 30 MG tablet Take 30 mg by mouth at bedtime.   Yes Historical Provider, MD  pravastatin (PRAVACHOL) 10 MG tablet Take 10 mg by mouth daily.   Yes Historical Provider, MD  zolpidem (AMBIEN) 10 MG tablet Take 10 mg by mouth at bedtime.   Yes Historical Provider, MD  albuterol (PROVENTIL HFA;VENTOLIN HFA) 108 (90 BASE) MCG/ACT inhaler Inhale 2 puffs into the lungs every 4 (four) hours as needed for wheezing or shortness of breath. 06/27/14   Shon Baton, MD  amoxicillin (AMOXIL) 500 MG capsule Take 1 capsule (500 mg total) by mouth 3 (three) times daily. 04/24/13   Kathie Dike, PA-C  HYDROcodone-acetaminophen (NORCO/VICODIN) 5-325 MG per tablet Take 1 tablet by mouth every 6 (six) hours as needed for moderate pain or severe pain. 06/27/14   Shon Baton, MD  levofloxacin (LEVAQUIN) 500 MG tablet Take 1 tablet (500 mg total) by mouth daily. 06/27/14   Shon Baton, MD  meloxicam (MOBIC) 7.5 MG tablet 1 po bid with food 04/24/13   Kathie Dike, PA-C  oxybutynin (DITROPAN XL) 10 MG 24 hr tablet Take 1 tablet (10 mg total) by mouth daily. 04/11/13   Lazaro ArmsLuther H Eure, MD  Probiotic Product (PROBIOTIC DAILY PO) Take 1 tablet by mouth every evening.    Historical Provider, MD  traMADol (ULTRAM) 50 MG tablet Take 1 tablet (50 mg total) by mouth every 6 (six) hours as needed for pain. 04/24/13   Kathie DikeHobson M Bryant, PA-C   BP 132/78  Pulse 65  Temp(Src) 99.1 F (37.3 C) (Oral)  Resp 14  Ht 5' 3.5" (1.613 m)  Wt 140 lb (63.504 kg)  BMI 24.41 kg/m2  SpO2 94% Physical Exam  Nursing note and vitals reviewed. Constitutional: She  is oriented to person, place, and time. She appears well-developed and well-nourished.  HENT:  Head: Normocephalic and atraumatic.  Eyes: Pupils are equal, round, and reactive to light.  Cardiovascular: Normal rate, regular rhythm and normal heart sounds.   No murmur heard. Pulmonary/Chest: Effort normal. No respiratory distress. She has wheezes. She exhibits no tenderness.  Scant wheeze  Abdominal: Soft. Bowel sounds are normal. There is no tenderness. There is no rebound.  Neurological: She is alert and oriented to person, place, and time.  Skin: Skin is warm and dry.  Psychiatric: She has a normal mood and affect.    ED Course  Procedures (including critical care time) Labs Review Labs Reviewed  CBC WITH DIFFERENTIAL - Abnormal; Notable for the following:    RBC 3.47 (*)    Hemoglobin 11.6 (*)    HCT 33.5 (*)    All other components within normal limits  BASIC METABOLIC PANEL - Abnormal; Notable for the following:    Potassium 3.6 (*)    Glucose, Bld 106 (*)    All other components within normal limits  D-DIMER, QUANTITATIVE - Abnormal; Notable for the following:    D-Dimer, Quant 0.73 (*)    All other components within normal limits  TROPONIN I    Imaging Review Dg Chest 2 View  06/27/2014   CLINICAL DATA:  Chest pain, dry cough and shortness of breath.  EXAM: CHEST  2 VIEW  COMPARISON:  06/07/2011  FINDINGS: There are streaky lower lung opacities and bronchial wall thickening. No edema, effusion, or pneumothorax. Normal heart size and mediastinal contours.  IMPRESSION: Bilateral atelectasis. If infectious symptoms this pattern can be seen with mycoplasma or virus.   Electronically Signed   By: Tiburcio PeaJonathan  Watts M.D.   On: 06/27/2014 01:21   Ct Angio Chest W/cm &/or Wo Cm  06/27/2014   CLINICAL DATA:  Sharp LEFT-sided chest pain and shortness of breath for 5 days. Dry cough. Pain with inspiration.  EXAM: CT ANGIOGRAPHY CHEST WITH CONTRAST  TECHNIQUE: Multidetector CT imaging  of the chest was performed using the standard protocol during bolus administration of intravenous contrast. Multiplanar CT image reconstructions and MIPs were obtained to evaluate the vascular anatomy.  CONTRAST:  100mL OMNIPAQUE IOHEXOL 350 MG/ML SOLN  COMPARISON:  Chest radiograph June 27, 2014  FINDINGS: Adequate contrast opacification of the pulmonary artery's. Main pulmonary artery is not enlarged. No pulmonary arterial filling defects to the level of the subsegmental branches.  Heart and pericardium are unremarkable, no right heart strain. Thoracic aorta is normal course and caliber, unremarkable. No lymphadenopathy by CT size criteria. Tracheobronchial tree is patent, no pneumothorax. Patchy hypoenhancing consolidation RIGHT lower lobe with patchy areas of lung base atelectasis. Diffuse heterogeneous lung attenuation with bronchial wall thickening. Mild centrilobular emphysema.  Included view of the abdomen  is unremarkable. Visualized soft tissues and included osseous structures appear normal.  Review of the MIP images confirms the above findings.  IMPRESSION: No acute pulmonary embolism.  RIGHT lower lobe bronchopneumonia. Bilateral lower lobe atelectasis. Heterogeneous lung attenuation suggests small airway disease. Mild centrilobular emphysema.   Electronically Signed   By: Awilda Metroourtnay  Bloomer   On: 06/27/2014 02:55     EKG Interpretation   Date/Time:  Wednesday June 27 2014 00:02:06 EDT Ventricular Rate:  66 PR Interval:  145 QRS Duration: 138 QT Interval:  437 QTC Calculation: 458 R Axis:   106 Text Interpretation:  Sinus rhythm RBBB and LPFB RBBB and LPFB are new  when compared to prior Confirmed by HORTON  MD, COURTNEY ([id number]) on  06/27/2014 2:49:57 AM      MDM   Final diagnoses:  Chest pain  Pleuritic chest pain  Community acquired pneumonia   Patient presents with chest pain. It has been constant since yesterday. It is pleuritic in nature. It is not reproducible on exam.  Heart score is 3. EKG shows no evidence of acute ischemia but does show a new right bundle branch block with left posterior fascicular block which is changed from EKG in 2012. Troponin is negative. The pleuritic nature of pain, d-dimer was sent and is positive. CTA obtained. Chest x-ray concerning for possible viral process. CTA negative for blood clot but does show evidence of a right lower lobe pneumonia. Patient reports cough but no fevers. Given pleuritic nature pain and slight wheezing on exam, suspect this may be the cause of the patient's chest pain. Will treat with Levaquin and an inhaler.  At this time I do not feel her chest pain is related to ACS and troponin is reassuring; however, given EKG changes will have patient follow up with cardiology for stress testing.   After history, exam, and medical workup I feel the patient has been appropriately medically screened and is safe for discharge home. Pertinent diagnoses were discussed with the patient. Patient was given return precautions.   Shon Batonourtney F Horton, MD 06/27/14 [phone]

## 2014-06-27 NOTE — Discharge Instructions (Signed)
You were seen today for chest pain. Found to have pneumonia on her CT scan. The nature of your pain may be secondary to long inflammation. You will be treated. Your screening EKG has changed since her last EKG 3 years ago. At this time I do not failure chest pain is related to your heart; however, given change in EKG and risk factors, would have you follow-up with cardiology for stress testing.  Chest Pain (Nonspecific) It is often hard to give a specific diagnosis for the cause of chest pain. There is always a chance that your pain could be related to something serious, such as a heart attack or a blood clot in the lungs. You need to follow up with your health care provider for further evaluation. CAUSES   Heartburn.  Pneumonia or bronchitis.  Anxiety or stress.  Inflammation around your heart (pericarditis) or lung (pleuritis or pleurisy).  A blood clot in the lung.  A collapsed lung (pneumothorax). It can develop suddenly on its own (spontaneous pneumothorax) or from trauma to the chest.  Shingles infection (herpes zoster virus). The chest wall is composed of bones, muscles, and cartilage. Any of these can be the source of the pain.  The bones can be bruised by injury.  The muscles or cartilage can be strained by coughing or overwork.  The cartilage can be affected by inflammation and become sore (costochondritis). DIAGNOSIS  Lab tests or other studies may be needed to find the cause of your pain. Your health care provider may have you take a test called an ambulatory electrocardiogram (ECG). An ECG records your heartbeat patterns over a 24-hour period. You may also have other tests, such as:  Transthoracic echocardiogram (TTE). During echocardiography, sound waves are used to evaluate how blood flows through your heart.  Transesophageal echocardiogram (TEE).  Cardiac monitoring. This allows your health care provider to monitor your heart rate and rhythm in real time.  Holter  monitor. This is a portable device that records your heartbeat and can help diagnose heart arrhythmias. It allows your health care provider to track your heart activity for several days, if needed.  Stress tests by exercise or by giving medicine that makes the heart beat faster. TREATMENT   Treatment depends on what may be causing your chest pain. Treatment may include:  Acid blockers for heartburn.  Anti-inflammatory medicine.  Pain medicine for inflammatory conditions.  Antibiotics if an infection is present.  You may be advised to change lifestyle habits. This includes stopping smoking and avoiding alcohol, caffeine, and chocolate.  You may be advised to keep your head raised (elevated) when sleeping. This reduces the chance of acid going backward from your stomach into your esophagus. Most of the time, nonspecific chest pain will improve within 2-3 days with rest and mild pain medicine.  HOME CARE INSTRUCTIONS   If antibiotics were prescribed, take them as directed. Finish them even if you start to feel better.  For the next few days, avoid physical activities that bring on chest pain. Continue physical activities as directed.  Do not use any tobacco products, including cigarettes, chewing tobacco, or electronic cigarettes.  Avoid drinking alcohol.  Only take medicine as directed by your health care provider.  Follow your health care provider's suggestions for further testing if your chest pain does not go away.  Keep any follow-up appointments you made. If you do not go to an appointment, you could develop lasting (chronic) problems with pain. If there is any problem keeping an  appointment, call to reschedule. SEEK MEDICAL CARE IF:   Your chest pain does not go away, even after treatment.  You have a rash with blisters on your chest.  You have a fever. SEEK IMMEDIATE MEDICAL CARE IF:   You have increased chest pain or pain that spreads to your arm, neck, jaw, back, or  abdomen.  You have shortness of breath.  You have an increasing cough, or you cough up blood.  You have severe back or abdominal pain.  You feel nauseous or vomit.  You have severe weakness.  You faint.  You have chills. This is an emergency. Do not wait to see if the pain will go away. Get medical help at once. Call your local emergency services (911 in [place] Do not drive yourself to the hospital. MAKE SURE YOU:   Understand these instructions.  Will watch your condition.  Will get help right away if you are not doing well or get worse. Document Released: 05/27/2005 Document Revised: 08/22/2013 Document Reviewed: 03/22/2008 [facility] Patient Information 2015 [place], [place]. This information is not intended to replace advice given to you by your health care provider. Make sure you discuss any questions you have with your health care provider.   Pneumonia Pneumonia is an infection of the lungs.  CAUSES Pneumonia may be caused by bacteria or a virus. Usually, these infections are caused by breathing infectious particles into the lungs (respiratory tract). SIGNS AND SYMPTOMS   Cough.  Fever.  Chest pain.  Increased rate of breathing.  Wheezing.  Mucus production. DIAGNOSIS  If you have the common symptoms of pneumonia, your health care provider will typically confirm the diagnosis with a chest X-ray. The X-ray will show an abnormality in the lung (pulmonary infiltrate) if you have pneumonia. Other tests of your blood, urine, or sputum may be done to find the specific cause of your pneumonia. Your health care provider may also do tests (blood gases or pulse oximetry) to see how well your lungs are working. TREATMENT  Some forms of pneumonia may be spread to other people when you cough or sneeze. You may be asked to wear a mask before and during your exam. Pneumonia that is caused by bacteria is treated with antibiotic medicine. Pneumonia that is caused by the influenza  virus may be treated with an antiviral medicine. Most other viral infections must run their course. These infections will not respond to antibiotics.  HOME CARE INSTRUCTIONS   Cough suppressants may be used if you are losing too much rest. However, coughing protects you by clearing your lungs. You should avoid using cough suppressants if you can.  Your health care provider may have prescribed medicine if he or she thinks your pneumonia is caused by bacteria or influenza. Finish your medicine even if you start to feel better.  Your health care provider may also prescribe an expectorant. This loosens the mucus to be coughed up.  Take medicines only as directed by your health care provider.  Do not smoke. Smoking is a common cause of bronchitis and can contribute to pneumonia. If you are a smoker and continue to smoke, your cough may last several weeks after your pneumonia has cleared.  A cold steam vaporizer or humidifier in your room or home may help loosen mucus.  Coughing is often worse at night. Sleeping in a semi-upright position in a recliner or using a couple pillows under your head will help with this.  Get rest as you feel it is needed.  Your body will usually let you know when you need to rest. PREVENTION A pneumococcal shot (vaccine) is available to prevent a common bacterial cause of pneumonia. This is usually suggested for:  People over 49 years old.  Patients on chemotherapy.  People with chronic lung problems, such as bronchitis or emphysema.  People with immune system problems. If you are over 65 or have a high risk condition, you may receive the pneumococcal vaccine if you have not received it before. In some countries, a routine influenza vaccine is also recommended. This vaccine can help prevent some cases of pneumonia.You may be offered the influenza vaccine as part of your care. If you smoke, it is time to quit. You may receive instructions on how to stop smoking. Your  health care provider can provide medicines and counseling to help you quit. SEEK MEDICAL CARE IF: You have a fever. SEEK IMMEDIATE MEDICAL CARE IF:   Your illness becomes worse. This is especially true if you are elderly or weakened from any other disease.  You cannot control your cough with suppressants and are losing sleep.  You begin coughing up blood.  You develop pain which is getting worse or is uncontrolled with medicines.  Any of the symptoms which initially brought you in for treatment are getting worse rather than better.  You develop shortness of breath or chest pain. MAKE SURE YOU:   Understand these instructions.  Will watch your condition.  Will get help right away if you are not doing well or get worse. Document Released: 08/17/2005 Document Revised: 01/01/2014 Document Reviewed: 11/06/2010 [facility] Patient Information 2015 [place]. This information is not intended to replace advice given to you by your health care provider. Make sure you discuss any questions you have with your health care provider.

## 2014-07-02 ENCOUNTER — Encounter (HOSPITAL_COMMUNITY): Payer: Self-pay | Admitting: Emergency Medicine

## 2014-08-06 DIAGNOSIS — R252 Cramp and spasm: Secondary | ICD-10-CM | POA: Insufficient documentation

## 2014-11-06 ENCOUNTER — Ambulatory Visit (INDEPENDENT_AMBULATORY_CARE_PROVIDER_SITE_OTHER): Payer: 59 | Admitting: Urology

## 2014-11-06 ENCOUNTER — Other Ambulatory Visit: Payer: Self-pay | Admitting: Urology

## 2014-11-06 DIAGNOSIS — R300 Dysuria: Secondary | ICD-10-CM

## 2014-11-06 DIAGNOSIS — N390 Urinary tract infection, site not specified: Secondary | ICD-10-CM | POA: Diagnosis not present

## 2014-11-06 DIAGNOSIS — T83511D Infection and inflammatory reaction due to indwelling urethral catheter, subsequent encounter: Principal | ICD-10-CM

## 2014-11-09 ENCOUNTER — Ambulatory Visit (HOSPITAL_COMMUNITY)
Admission: RE | Admit: 2014-11-09 | Discharge: 2014-11-09 | Disposition: A | Discharge status: Home / Self Care | Payer: 59 | Source: Ambulatory Visit | Attending: Urology | Admitting: Urology

## 2014-11-09 DIAGNOSIS — T83511D Infection and inflammatory reaction due to indwelling urethral catheter, subsequent encounter: Secondary | ICD-10-CM

## 2014-11-09 DIAGNOSIS — N390 Urinary tract infection, site not specified: Secondary | ICD-10-CM

## 2014-11-09 DIAGNOSIS — T8351XD Infection and inflammatory reaction due to indwelling urinary catheter, subsequent encounter: Secondary | ICD-10-CM | POA: Diagnosis present

## 2015-01-22 ENCOUNTER — Ambulatory Visit: Payer: 59 | Admitting: Urology

## 2015-02-11 ENCOUNTER — Other Ambulatory Visit (HOSPITAL_COMMUNITY): Payer: Self-pay | Admitting: Internal Medicine

## 2015-02-11 DIAGNOSIS — Z1231 Encounter for screening mammogram for malignant neoplasm of breast: Secondary | ICD-10-CM

## 2015-02-18 ENCOUNTER — Ambulatory Visit (HOSPITAL_COMMUNITY)
Admission: RE | Admit: 2015-02-18 | Discharge: 2015-02-18 | Disposition: A | Discharge status: Home / Self Care | Payer: 59 | Source: Ambulatory Visit | Attending: Internal Medicine | Admitting: Internal Medicine

## 2015-02-18 DIAGNOSIS — Z1231 Encounter for screening mammogram for malignant neoplasm of breast: Secondary | ICD-10-CM | POA: Diagnosis present

## 2015-09-12 ENCOUNTER — Encounter (INDEPENDENT_AMBULATORY_CARE_PROVIDER_SITE_OTHER): Payer: Self-pay | Admitting: *Deleted

## 2015-12-04 DIAGNOSIS — J020 Streptococcal pharyngitis: Secondary | ICD-10-CM | POA: Diagnosis not present

## 2015-12-04 DIAGNOSIS — Z1389 Encounter for screening for other disorder: Secondary | ICD-10-CM | POA: Diagnosis not present

## 2015-12-04 DIAGNOSIS — Z6821 Body mass index (BMI) 21.0-21.9, adult: Secondary | ICD-10-CM | POA: Diagnosis not present

## 2015-12-04 DIAGNOSIS — J029 Acute pharyngitis, unspecified: Secondary | ICD-10-CM | POA: Diagnosis not present

## 2016-01-20 DIAGNOSIS — F419 Anxiety disorder, unspecified: Secondary | ICD-10-CM | POA: Diagnosis not present

## 2016-01-20 DIAGNOSIS — Z1389 Encounter for screening for other disorder: Secondary | ICD-10-CM | POA: Diagnosis not present

## 2016-01-20 DIAGNOSIS — G894 Chronic pain syndrome: Secondary | ICD-10-CM | POA: Diagnosis not present

## 2016-01-20 DIAGNOSIS — Z6821 Body mass index (BMI) 21.0-21.9, adult: Secondary | ICD-10-CM | POA: Diagnosis not present

## 2016-08-09 ENCOUNTER — Encounter (HOSPITAL_COMMUNITY): Payer: Self-pay

## 2016-08-09 ENCOUNTER — Observation Stay (HOSPITAL_COMMUNITY)
Admission: EM | Admit: 2016-08-09 | Discharge: 2016-08-10 | Disposition: A | Discharge status: Home / Self Care | Payer: BLUE CROSS/BLUE SHIELD | Attending: Internal Medicine | Admitting: Internal Medicine

## 2016-08-09 ENCOUNTER — Emergency Department (HOSPITAL_COMMUNITY): Payer: BLUE CROSS/BLUE SHIELD

## 2016-08-09 DIAGNOSIS — Z79899 Other long term (current) drug therapy: Secondary | ICD-10-CM | POA: Insufficient documentation

## 2016-08-09 DIAGNOSIS — Z8249 Family history of ischemic heart disease and other diseases of the circulatory system: Secondary | ICD-10-CM | POA: Diagnosis not present

## 2016-08-09 DIAGNOSIS — R079 Chest pain, unspecified: Principal | ICD-10-CM | POA: Diagnosis present

## 2016-08-09 DIAGNOSIS — I4510 Unspecified right bundle-branch block: Secondary | ICD-10-CM | POA: Diagnosis not present

## 2016-08-09 DIAGNOSIS — E876 Hypokalemia: Secondary | ICD-10-CM | POA: Diagnosis not present

## 2016-08-09 DIAGNOSIS — F329 Major depressive disorder, single episode, unspecified: Secondary | ICD-10-CM | POA: Insufficient documentation

## 2016-08-09 DIAGNOSIS — E7800 Pure hypercholesterolemia, unspecified: Secondary | ICD-10-CM | POA: Diagnosis not present

## 2016-08-09 DIAGNOSIS — F17210 Nicotine dependence, cigarettes, uncomplicated: Secondary | ICD-10-CM | POA: Diagnosis not present

## 2016-08-09 DIAGNOSIS — F419 Anxiety disorder, unspecified: Secondary | ICD-10-CM | POA: Diagnosis not present

## 2016-08-09 DIAGNOSIS — G894 Chronic pain syndrome: Secondary | ICD-10-CM | POA: Diagnosis present

## 2016-08-09 DIAGNOSIS — E785 Hyperlipidemia, unspecified: Secondary | ICD-10-CM | POA: Diagnosis present

## 2016-08-09 DIAGNOSIS — Z720 Tobacco use: Secondary | ICD-10-CM | POA: Diagnosis present

## 2016-08-09 DIAGNOSIS — R110 Nausea: Secondary | ICD-10-CM | POA: Diagnosis not present

## 2016-08-09 DIAGNOSIS — R0789 Other chest pain: Secondary | ICD-10-CM | POA: Diagnosis not present

## 2016-08-09 HISTORY — DX: Personal history of other diseases of the musculoskeletal system and connective tissue: Z87.39

## 2016-08-09 HISTORY — DX: Tobacco use: Z72.0

## 2016-08-09 LAB — CBC WITH DIFFERENTIAL/PLATELET
BASOS ABS: 0 10*3/uL (ref 0.0–0.1)
Basophils Relative: 0 %
EOS ABS: 0.1 10*3/uL (ref 0.0–0.7)
EOS PCT: 1 %
HCT: 39.9 % (ref 36.0–46.0)
Hemoglobin: 13.5 g/dL (ref 12.0–15.0)
Lymphocytes Relative: 35 %
Lymphs Abs: 3 10*3/uL (ref 0.7–4.0)
MCH: 32.2 pg (ref 26.0–34.0)
MCHC: 33.8 g/dL (ref 30.0–36.0)
MCV: 95.2 fL (ref 78.0–100.0)
MONO ABS: 0.4 10*3/uL (ref 0.1–1.0)
Monocytes Relative: 5 %
Neutro Abs: 5.1 10*3/uL (ref 1.7–7.7)
Neutrophils Relative %: 59 %
PLATELETS: 231 10*3/uL (ref 150–400)
RBC: 4.19 MIL/uL (ref 3.87–5.11)
RDW: 13.3 % (ref 11.5–15.5)
WBC: 8.7 10*3/uL (ref 4.0–10.5)

## 2016-08-09 LAB — BASIC METABOLIC PANEL
Anion gap: 9 (ref 5–15)
BUN: 6 mg/dL (ref 6–20)
CALCIUM: 9 mg/dL (ref 8.9–10.3)
CO2: 23 mmol/L (ref 22–32)
Chloride: 107 mmol/L (ref 101–111)
Creatinine, Ser: 0.73 mg/dL (ref 0.44–1.00)
GFR calc Af Amer: >60 mL/min (ref >60)
GLUCOSE: 93 mg/dL (ref 65–99)
POTASSIUM: 3.3 mmol/L — AB (ref 3.5–5.1)
SODIUM: 139 mmol/L (ref 135–145)

## 2016-08-09 LAB — I-STAT TROPONIN, ED: TROPONIN I, POC: 0 ng/mL (ref 0.00–0.08)

## 2016-08-09 MED ORDER — SODIUM CHLORIDE 0.9 % IV SOLN: 10.0000 mL/h | INTRAVENOUS | Status: DC

## 2016-08-09 MED ORDER — NITROGLYCERIN 0.4 MG SL SUBL
0.4000 mg | SUBLINGUAL_TABLET | SUBLINGUAL | Status: DC | PRN
  Administered 2016-08-10: 0.4 mg via SUBLINGUAL
  Filled 2016-08-09: qty 1

## 2016-08-09 NOTE — ED Triage Notes (Signed)
Per EMS: Pt complaining of generalized weakness and nausea. Pt complaining of chest pain, EMS gave 1 nitro. Pt states took 325 ASA prior to EMS arrival. Pt states no hx of same. Pt states 0/10 pain at this time.

## 2016-08-09 NOTE — ED Notes (Signed)
Patient transported to X-ray 

## 2016-08-09 NOTE — ED Provider Notes (Signed)
[facility]-EMERGENCY DEPT Provider Note   CSN: [id number] Arrival date & time: 08/09/16  2249     History   Chief Complaint Chief Complaint  Patient presents with  . Chest Pain  . Dizziness    HPI Katelyn Smith is a 50 y.o. female.  HPI   Pt is a 50 yo female with PMH of HLD, GERD, anxiety and depression who presents to the ED via EMS with complaint of CP, onset 10:30pm. Pt reports as she was walking into the bedroom to try to put her grandkids to sleep she had sudden onset nausea. She reports as she began to sit and lay down in bed she began to have lightheadedness/dizziness which she describes as "I feel like my head is swimming". Endorses associated numbness to her lips and tip of her tongue. She also reports having onset of constant, aching, right sided chest pain. Denies radiation. Pt denies having similar episodes of CP in the past; reports this feels different than her CP related to reflux. Reports CP was 7/10. She reports taking 325mg  ASA at home prior to EMS arrival. Denies taking any other medications. Pt reports after administration of nitro given by EMS, her CP resolved. She notes since arrival to the ED her CP has slowly started to return. Reports currently have mild discomfort she rates 3/10. Denies fever, chills, HA, neck pain, cough, SOB, wheezing, palpitations, diaphoresis, abdominal pain, vomiting, diarrhea, weakness, leg swelling. Denies personal hx of CAD, endorses positive family hx of cardiac disease (mother). Endorses smoking 1/2 PPD.   Past Medical History:  Diagnosis Date  . Anxiety   . Chronic neck pain   . Depression   . High cholesterol     Patient Active Problem List   Diagnosis Date Noted  . Chest pain 08/10/2016  . Urge incontinence 12/15/2012  . Chronic low back pain 12/15/2012    Past Surgical History:  Procedure Laterality Date  . BACK SURGERY    . TUBAL LIGATION      OB History    Gravida Para Term Preterm AB Living   3 3 3     3    SAB TAB  Ectopic Multiple Live Births                   Home Medications    Prior to Admission medications   Medication Sig Start Date End Date Taking? Authorizing Provider  ALPRAZolam Prudy Feeler(XANAX) 1 MG tablet Take 1 mg by mouth 3 (three) times daily.   Yes Historical Provider, MD  mirtazapine (REMERON) 30 MG tablet Take 30 mg by mouth at bedtime.   Yes Historical Provider, MD  Oxycodone HCl 20 MG TABS Take 20 mg by mouth every 4 (four) hours as needed (for pain).   Yes Historical Provider, MD  Probiotic Product (PROBIOTIC DAILY PO) Take 1 tablet by mouth every evening.   Yes Historical Provider, MD  rOPINIRole (REQUIP) 1 MG tablet Take 1 mg by mouth at bedtime.   Yes Historical Provider, MD  albuterol (PROVENTIL HFA;VENTOLIN HFA) 108 (90 BASE) MCG/ACT inhaler Inhale 2 puffs into the lungs every 4 (four) hours as needed for wheezing or shortness of breath. Patient not taking: Reported on 08/10/2016 06/27/14   Shon Batonourtney F Horton, MD  amoxicillin (AMOXIL) 500 MG capsule Take 1 capsule (500 mg total) by mouth 3 (three) times daily. Patient not taking: Reported on 08/10/2016 04/24/13   Ivery QualeHobson Bryant, PA-C  HYDROcodone-acetaminophen (NORCO/VICODIN) 5-325 MG per tablet Take 1 tablet by mouth every  6 (six) hours as needed for moderate pain or severe pain. Patient not taking: Reported on 08/10/2016 06/27/14   Shon Baton, MD  levofloxacin (LEVAQUIN) 500 MG tablet Take 1 tablet (500 mg total) by mouth daily. Patient not taking: Reported on 08/10/2016 06/27/14   Shon Baton, MD  meloxicam (MOBIC) 7.5 MG tablet 1 po bid with food Patient not taking: Reported on 08/10/2016 04/24/13   Ivery Quale, PA-C  oxybutynin (DITROPAN XL) 10 MG 24 hr tablet Take 1 tablet (10 mg total) by mouth daily. Patient not taking: Reported on 08/10/2016 04/11/13   Lazaro Arms, MD  traMADol (ULTRAM) 50 MG tablet Take 1 tablet (50 mg total) by mouth every 6 (six) hours as needed for pain. Patient not taking: Reported on  08/10/2016 04/24/13   Ivery Quale, PA-C    Family History Family History  Problem Relation Age of Onset  . Heart failure Mother   . Cancer Father   . Cancer Sister   . Stroke Other   . Heart disease Other     Social History Social History  Substance Use Topics  . Smoking status: Current Every Day Smoker    Packs/day: 0.50    Years: 30.00    Types: Cigarettes  . Smokeless tobacco: Never Used  . Alcohol use No     Allergies   Mirabegron   Review of Systems Review of Systems  Cardiovascular: Positive for chest pain.  Gastrointestinal: Positive for nausea.  Neurological: Positive for dizziness, light-headedness and numbness.  All other systems reviewed and are negative.    Physical Exam Updated Vital Signs BP 129/82   Pulse 69   Temp 97.8 F (36.6 C) (Oral)   Resp 11   SpO2 97%   Physical Exam  Constitutional: She is oriented to person, place, and time. She appears well-developed and well-nourished. No distress.  HENT:  Head: Normocephalic and atraumatic.  Eyes: Conjunctivae and EOM are normal. Right eye exhibits no discharge. Left eye exhibits no discharge. No scleral icterus.  Neck: Normal range of motion. Neck supple.  Cardiovascular: Normal rate, regular rhythm, normal heart sounds and intact distal pulses.   Pulmonary/Chest: Effort normal and breath sounds normal. No respiratory distress. She has no wheezes. She has no rales. She exhibits no tenderness.  Abdominal: Soft. Bowel sounds are normal. She exhibits no distension and no mass. There is no tenderness. There is no rebound and no guarding. No hernia.  Musculoskeletal: She exhibits no edema.  Neurological: She is alert and oriented to person, place, and time.  Skin: Skin is warm and dry. She is not diaphoretic.  Nursing note and vitals reviewed.    ED Treatments / Results  Labs (all labs ordered are listed, but only abnormal results are displayed) Labs Reviewed  BASIC METABOLIC PANEL -  Abnormal; Notable for the following:       Result Value   Potassium 3.3 (*)    All other components within normal limits  CBC WITH DIFFERENTIAL/PLATELET  D-DIMER, QUANTITATIVE (NOT AT [facility])  I-STAT TROPOININ, ED  I-STAT TROPOININ, ED    EKG  EKG Interpretation  Date/Time:  Sunday August 09 2016 23:00:18 EST Ventricular Rate:  76 PR Interval:  136 QRS Duration: 130 QT Interval:  436 QTC Calculation: 490 R Axis:   81 Text Interpretation:  Normal sinus rhythm Right bundle branch block Abnormal ECG When compared with ECG of 06/27/2014, No significant change was found Confirmed by [facility]  MD, DAVID ([id number]) on 08/09/2016 11:12:48 PM  Radiology Dg Chest 2 View  Result Date: 08/10/2016 CLINICAL DATA:  50 year old female with left-sided chest pain, and generalized weakness and dizziness. EXAM: CHEST  2 VIEW COMPARISON:  Chest CT dated 06/27/2014 FINDINGS: The lungs are clear. There is no pleural effusion or pneumothorax. The cardiac silhouette is within normal limits. There is osteopenia with mild degenerative changes of the spine. No acute osseous pathology. IMPRESSION: No active cardiopulmonary disease. Electronically Signed   By: Elgie CollardArash  Radparvar M.D.   On: 08/10/2016 00:53    Procedures Procedures (including critical care time)  Medications Ordered in ED Medications  0.9 %  sodium chloride infusion (not administered)  nitroGLYCERIN (NITROSTAT) SL tablet 0.4 mg (0.4 mg Sublingual Given 08/10/16 0004)  HYDROcodone-acetaminophen (NORCO/VICODIN) 5-325 MG per tablet 1 tablet (not administered)  ondansetron (ZOFRAN) injection 4 mg (4 mg Intravenous Given 08/10/16 0035)     Initial Impression / Assessment and Plan / ED Course  I have reviewed the triage vital signs and the nursing notes.  Pertinent labs & imaging results that were available during my care of the patient were reviewed by me and considered in my medical decision making (see chart for details).  Clinical  Course     Patient presented with episode of chest pain, shortness of breath, lightheadedness and nausea that occurred while she was putting her grandkids to sleep earlier this evening. Reports chest pain was relieved after dose of nitroglycerin administered by EMS prior to arrival. Reports family history of cardiac disease, endorses smoking daily and hx of HLD. Denies personal hx of cardiac disease. VSS. Exam unremarkable. Patient reports her chest pain has gradually returned since arrival to the ED. Patient given sublingual nitroglycerin with improvement of chest pain.  EKG showed sinus rhythm with right bundle branch block, no significant changes from prior. Troponin negative. D-dimer negative. Chest x-ray negative. On reevaluation patient denies having any chest pain. HEART score 5. Plan to admit pt for chest pain rule out. Consulted hospitalist, Dr. Maryfrances Bunnellanford reports he will come evaluate the pt in the ED. Discussed results and plan for admission with patient and family.  Final Clinical Impressions(s) / ED Diagnoses   Final diagnoses:  Chest pain, unspecified type    New Prescriptions New Prescriptions   No medications on file     Barrett Henleicole Elizabeth Nadeau, PA-C 08/10/16 [id number]    Dione Boozeavid Glick, MD 08/10/16 [id number]

## 2016-08-10 ENCOUNTER — Other Ambulatory Visit: Payer: Self-pay | Admitting: Physician Assistant

## 2016-08-10 ENCOUNTER — Encounter (HOSPITAL_COMMUNITY): Payer: Self-pay | Admitting: Family Medicine

## 2016-08-10 DIAGNOSIS — E785 Hyperlipidemia, unspecified: Secondary | ICD-10-CM | POA: Diagnosis not present

## 2016-08-10 DIAGNOSIS — R072 Precordial pain: Secondary | ICD-10-CM

## 2016-08-10 DIAGNOSIS — Z720 Tobacco use: Secondary | ICD-10-CM | POA: Diagnosis present

## 2016-08-10 DIAGNOSIS — G894 Chronic pain syndrome: Secondary | ICD-10-CM

## 2016-08-10 DIAGNOSIS — E876 Hypokalemia: Secondary | ICD-10-CM

## 2016-08-10 DIAGNOSIS — R079 Chest pain, unspecified: Secondary | ICD-10-CM

## 2016-08-10 LAB — I-STAT TROPONIN, ED: Troponin i, poc: 0 ng/mL (ref 0.00–0.08)

## 2016-08-10 LAB — LIPID PANEL
CHOLESTEROL: 186 mg/dL (ref 0–200)
HDL: 32 mg/dL — ABNORMAL LOW (ref >40)
LDL CALC: 138 mg/dL — AB (ref 0–99)
Total CHOL/HDL Ratio: 5.8 RATIO
Triglycerides: 80 mg/dL (ref <150)
VLDL: 16 mg/dL (ref 0–40)

## 2016-08-10 LAB — D-DIMER, QUANTITATIVE: D-Dimer, Quant: 0.41 ug/mL-FEU (ref 0.00–0.50)

## 2016-08-10 LAB — TROPONIN I: Troponin I: <0.03 ng/mL (ref <0.03)

## 2016-08-10 MED ORDER — POTASSIUM CHLORIDE CRYS ER 20 MEQ PO TBCR
40.0000 meq | EXTENDED_RELEASE_TABLET | Freq: Once | ORAL | Status: AC
  Administered 2016-08-10: 40 meq via ORAL
  Filled 2016-08-10: qty 2

## 2016-08-10 MED ORDER — GI COCKTAIL ~~LOC~~: 30.0000 mL | Freq: Four times a day (QID) | ORAL | Status: DC | PRN

## 2016-08-10 MED ORDER — ROPINIROLE HCL 1 MG PO TABS: 1.0000 mg | ORAL_TABLET | Freq: Every day | ORAL | Status: DC

## 2016-08-10 MED ORDER — HYDROCODONE-ACETAMINOPHEN 5-325 MG PO TABS
1.0000 | ORAL_TABLET | Freq: Once | ORAL | Status: DC
  Filled 2016-08-10: qty 1

## 2016-08-10 MED ORDER — MIRTAZAPINE 15 MG PO TABS: 30.0000 mg | ORAL_TABLET | Freq: Every day | ORAL | Status: DC

## 2016-08-10 MED ORDER — ONDANSETRON HCL 4 MG/2ML IJ SOLN: 4.0000 mg | Freq: Four times a day (QID) | INTRAMUSCULAR | Status: DC | PRN

## 2016-08-10 MED ORDER — OXYCODONE HCL 5 MG PO TABS
20.0000 mg | ORAL_TABLET | ORAL | Status: DC | PRN
  Administered 2016-08-10: 20 mg via ORAL
  Filled 2016-08-10: qty 4

## 2016-08-10 MED ORDER — ASPIRIN EC 81 MG PO TBEC
81.0000 mg | DELAYED_RELEASE_TABLET | Freq: Every day | ORAL | Status: DC
  Administered 2016-08-10: 81 mg via ORAL
  Filled 2016-08-10: qty 1

## 2016-08-10 MED ORDER — ASPIRIN 81 MG PO TBEC: 81.0000 mg | DELAYED_RELEASE_TABLET | Freq: Every day | ORAL | Status: DC

## 2016-08-10 MED ORDER — ACETAMINOPHEN 325 MG PO TABS: 650.0000 mg | ORAL_TABLET | ORAL | Status: DC | PRN

## 2016-08-10 MED ORDER — ONDANSETRON HCL 4 MG/2ML IJ SOLN
4.0000 mg | Freq: Once | INTRAMUSCULAR | Status: AC
Start: 2016-08-10 — End: 2016-08-10
  Administered 2016-08-10: 4 mg via INTRAVENOUS
  Filled 2016-08-10: qty 2

## 2016-08-10 MED ORDER — ENOXAPARIN SODIUM 40 MG/0.4ML ~~LOC~~ SOLN
40.0000 mg | SUBCUTANEOUS | Status: DC
  Administered 2016-08-10: 40 mg via SUBCUTANEOUS
  Filled 2016-08-10: qty 0.4

## 2016-08-10 NOTE — Progress Notes (Signed)
Patient ambulated without cardiac complaint. Stress test scheduled tomorrow at [facility], appt information and patient instructions placed in patient's discharge instructions in Epic. I told her this test usually takes several hours to complete with the imaging component. Pt told me she feels like she can attempt the treadmill. If needed we can change to [place]. OP f/u also arranged with [place]. Paged Dr. Benjamine MolaVann to make her aware.  Layney Gillson PA-C

## 2016-08-10 NOTE — H&P (Signed)
History and Physical  Patient Name: Katelyn Smith     ZOX:[id number]    DOB: 02-12-1966    DOA: 08/09/2016 PCP: Colette RibasGOLDING, JOHN CABOT, MD   Patient coming from: Home     Chief Complaint: Chest pain  HPI: Katelyn Collaammy L Bracy is a 50 y.o. female with a past medical history significant for hyperlipidemia, smoking and chronic pain on oxycodone who presents with chest pain.  The patient was in her usual state of health without recent chest pain until tonight around 10:30 PM she was standing by her grandson's bed when she had sudden onset of queasiness/nausea. It was intense, so she went and laid down on her bed, noticed her lips felt tingly or numb and her tongue felt tingly and numb and she felt dizzy, and then had a sensation of dull chest pressure on the left side of her chest, "like indigestion".  This persisted, so she had her husband call 9-1-1 who administered aspirin 325 mg and nitroglycerin, which eased her pain.  ED course: -Afebrile, heart rate 76, respirations pulse ox normal, blood pressure 144/77 -Initial ECG showed old RBBB and troponin was negative. -Na 139, K 3.3, Cr 0.73, WBC 8.7, Hgb 13.5 -D-dimer negative -CXR clear -TRH was asked to admit for observation, serial troponins and risk stratification.  She is an active smoker, smoked half pack per cigarettes per day for 30 years. She is postmenopausal. Her mother had a heart attack at age 51. She has hyperlipidemia, no longer takes a statin. She has never had a heart attack, stroke, or vascular disease. She was discharged from the ER for chest pain 2 years ago, had cardiology follow-up, who ordered ABIs and a stress echo.  She thinks both of those were normal but she doesn't remember.      Review of Systems:  Review of Systems  Constitutional: Negative for chills and fever.  Respiratory: Positive for shortness of breath.   Cardiovascular: Positive for chest pain.  Gastrointestinal: Positive for nausea.  Neurological: Positive  for dizziness, tingling and sensory change.  Psychiatric/Behavioral: The patient is nervous/anxious.   All other systems reviewed and are negative.    Past Medical History:  Diagnosis Date  . Anxiety   . Chronic neck pain   . Depression   . High cholesterol     Past Surgical History:  Procedure Laterality Date  . BACK SURGERY    . TUBAL LIGATION      Social History: Patient lives with her husband and family.  Patient walks unassisted.  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  She smokes.    Allergies  Allergen Reactions  . Mirabegron Hypertension    Family history: family history includes Cancer in her father and sister; Heart disease in her other; Heart disease (age of onset: 4453) in her mother; Heart failure in her mother; Stroke in her other.  Prior to Admission medications   Medication Sig Start Date End Date Taking? Authorizing Provider  ALPRAZolam Prudy Feeler(XANAX) 1 MG tablet Take 1 mg by mouth 3 (three) times daily.   Yes Historical Provider, MD  mirtazapine (REMERON) 30 MG tablet Take 30 mg by mouth at bedtime.   Yes Historical Provider, MD  Oxycodone HCl 20 MG TABS Take 20 mg by mouth every 4 (four) hours as needed (for pain).   Yes Historical Provider, MD  Probiotic Product (PROBIOTIC DAILY PO) Take 1 tablet by mouth every evening.   Yes Historical Provider, MD  rOPINIRole (REQUIP) 1 MG tablet Take 1 mg by mouth  at bedtime.   Yes Historical Provider, MD       Physical Exam: BP 129/82   Pulse 69   Temp 97.8 F (36.6 C) (Oral)   Resp 11   SpO2 97%  General appearance: Well-developed, adult female, alert and in no acute distress.   Eyes: Anicteric, conjunctiva pink, lids and lashes normal.     ENT: No nasal deformity, discharge, or epistaxis.  OP moist without lesions.   Skin: Warm and dry.   Cardiac: RRR, nl S1-S2, no murmurs appreciated.  Capillary refill is brisk.  JVP normal.  No LE edema.  Radial and DP pulses 2+ and symmetric.  No carotid bruits. Respiratory: Normal respiratory rate  and rhythm.  CTAB without rales or wheezes. GI: Abdomen soft without rigidity.  No TTP. No ascites, distension.   MSK: No deformities or effusions.   Pain not reproduced with palpation of precordium.  No pain with arm movement. Neuro: Sensorium intact and responding to questions, attention normal.  Speech is fluent.  Moves all extremities equally and with normal coordination.    Psych: Behavior appropriate.  Affect normal.  No evidence of aural or visual hallucinations or delusions.       Labs on Admission:  The metabolic panel shows normal electrolytes and renal function. The complete blood count shows no leukocytosis, anemia, thrombocytopenia. The initial troponin is negative. D-dimer negative.  Radiological Exams on Admission: Personally reviewed CXR shows no focal opacity: Dg Chest 2 View  Result Date: 08/10/2016 CLINICAL DATA:  50 year old female with left-sided chest pain, and generalized weakness and dizziness. EXAM: CHEST  2 VIEW COMPARISON:  Chest CT dated 06/27/2014 FINDINGS: The lungs are clear. There is no pleural effusion or pneumothorax. The cardiac silhouette is within normal limits. There is osteopenia with mild degenerative changes of the spine. No acute osseous pathology. IMPRESSION: No active cardiopulmonary disease. Electronically Signed   By: Elgie CollardArash  Radparvar M.D.   On: 08/10/2016 00:53    EKG: Independently reviewed. Rate 76, QTc 490, RBBB, old.  Stress echo 2015, results unavailable.      Assessment/Plan  1. Chest pain: This is new.  The patient has HEART score of 4. Angina is atypical in that it was not exertional, but was left sided, dull and relieved with nitro.  Other potential causes of chest pain (PE, dissection, pancreatitis, pneumonia/effusion, pericarditis) are doubted or have been ruled out.  We have been asked to admit the patient for observation and etiology consultation with Cardiology tomorrow.  -Serial troponins are  ordered -Telemetry -Consult to cardiology, appreciate recommendations -Smoking cessation was recommended, specific modalities discussed, patient contemplative   2. Chronic pain:  -Continue home oxycodone  3. Anxiety:  -Continue mirtazapine  -Hold alprazolam        DVT prophylaxis: Lovenox Diet: NPO after 4am for anticipated stress testing Code Status: FULL  Family Communication: Husband at bedside  Disposition Plan: Anticipate overnight observation for arrhythmia on telemetry, serial troponins and subsequent risk stratification by Cardiology.  If testing negative, home after. Consults called: Cardiology, via Inbasket Admission status: Telemetry, OBS   Medical decision making: Patient seen at 2:45 AM on 08/10/2016.  The patient was discussed with Melburn HakeNicole Nadeau, PA-C. What exists of the patient's chart was reviewed in depth.  Clinical condition: stable.      Alberteen SamChristopher P Xandra Laramee [facility] Pager [phone]

## 2016-08-10 NOTE — Discharge Instructions (Signed)
° °  You have a Stress Test scheduled at [facility]. Your doctor has ordered this test to get a better idea of how your heart works.  Please arrive 15 minutes early for paperwork.   Location: [address] [place], [place] [address]  Instructions:  No food/drink after midnight the night before.   No caffeine/decaf products 24 hours before, including medicines such as Excedrin or Goody Powders. Call if there are any questions.   Wear comfortable clothes and shoes.   It is OK to take your morning meds with a sip of water EXCEPT for those types of medicines listed below or otherwise instructed.  Special Medication Instructions:  Beta blockers such as metoprolol (Lopressor/Toprol XL), atenolol (Tenormin), carvedilol (Coreg), nebivolol (Bystolic), propranolol (Inderal) should not be taken for 24 hours before the test.  Calcium channel blockers such as diltiazem (Cardizem) or verapmil (Calan) should not be taken for 24 hours before the test.  Remove nitroglycerin patches and do not take nitrate preparations such as Imdur/isosorbide the day of your test.  No Persantine/Theophylline or Aggrenox medicines should be used within 24 hours of the test.   What To Expect: The whole test will take several hours. When you arrive in the lab, the technician will inject a small amount of radioactive tracer into your arm through an IV while you are resting quietly. This helps us to form pictures of your heart. You will likely only feel a sting from the IV. After a waiting period, resting pictures will be obtained under a big camera. These are the "before" pictures.  Next, you will be prepped for the stress portion of the test. This may include either walking on a treadmill or receiving a medicine that helps to dilate blood vessels in your heart to simulate the effect of exercise on your heart. If you are walking on a treadmill, you will walk at different paces to try to get  your heart rate to a goal number that is based on your age. If your doctor has chosen the pharmacologic test, then you will receive a medicine through your IV that may cause temporary nausea, flushing, shortness of breath and sometimes chest discomfort or vomiting. This is typically short-lived and usually resolves quickly. Your blood pressure and heart rate will be monitored, and we will be watching your EKG on a computer screen for any changes. During this portion of the test, the radiologist will inject another small amount of radioactive tracer into your IV. After a waiting period, you will undergo a second set of pictures. These are the "after" pictures.  The doctor reading the test will compare the before-and-after images to look for evidence of heart blockages or heart weakness. In certain instances, this test is done over 2 days but usually only takes 1 day to complete.

## 2016-08-10 NOTE — Discharge Summary (Signed)
Physician Discharge Summary  Katelyn Smith ZOX:[id number] DOB: [birth year] DOA: 08/09/2016  PCP: Colette RibasGOLDING, JOHN CABOT, MD  Admit date: 08/09/2016 Discharge date: 08/10/2016   Recommendations for Outpatient Follow-Up:   1. Outpatient stress test in AM   Discharge Diagnosis:   Principal Problem:   Chest pain Active Problems:   Hyperlipidemia   Chronic pain syndrome   Tobacco abuse   Hypokalemia   Discharge disposition:  Home.   Discharge Condition: Improved.  Diet recommendation: Low sodium, heart healthy.  Wound care: None.   History of Present Illness:   Katelyn Smith is a 50 y.o. female with a past medical history significant for hyperlipidemia, smoking and chronic pain on oxycodone who presents with chest pain.  The patient was in her usual state of health without recent chest pain until tonight around 10:30 PM she was standing by her grandson's bed when she had sudden onset of queasiness/nausea. It was intense, so she went and laid down on her bed, noticed her lips felt tingly or numb and her tongue felt tingly and numb and she felt dizzy, and then had a sensation of dull chest pressure on the left side of her chest, "like indigestion".  This persisted, so she had her husband call 9-1-1 who administered aspirin 325 mg and nitroglycerin, which eased her pain.    Hospital Course by Problem:   Patient ambulated without cardiac complaint. [facility], appt information and patient instructions placed in patient's discharge instructions in Epic    Medical Consultants:    cards   Discharge Exam:   Vitals:   08/10/16 0345 08/10/16 0418  BP: 112/70 (!) 106/56  Pulse: 76 70  Resp: 15 18  Temp:  97.7 F (36.5 C)   Vitals:   08/10/16 0315 08/10/16 0330 08/10/16 0345 08/10/16 0418  BP: 123/77 120/73 112/70 (!) 106/56  Pulse: 72 66 76 70  Resp: 15 16 15 18   Temp:    97.7 F (36.5 C)  TempSrc:    Oral    SpO2: 94% 94% 96% 98%  Weight:    54.6 kg (120 lb 6.4 oz)  Height:    5\' 3"  (1.6 m)      The results of significant diagnostics from this hospitalization (including imaging, microbiology, ancillary and laboratory) are listed below for reference.     Procedures and Diagnostic Studies:   Dg Chest 2 View  Result Date: 08/10/2016 CLINICAL DATA:  50 year old female with left-sided chest pain, and generalized weakness and dizziness. EXAM: CHEST  2 VIEW COMPARISON:  Chest CT dated 06/27/2014 FINDINGS: The lungs are clear. There is no pleural effusion or pneumothorax. The cardiac silhouette is within normal limits. There is osteopenia with mild degenerative changes of the spine. No acute osseous pathology. IMPRESSION: No active cardiopulmonary disease. Electronically Signed   By: Elgie CollardArash  Radparvar M.D.   On: 08/10/2016 00:53     Labs:   Basic Metabolic Panel:  Recent Labs Lab 08/09/16 2319  NA 139  K 3.3*  CL 107  CO2 23  GLUCOSE 93  BUN 6  CREATININE 0.73  CALCIUM 9.0   GFR Estimated Creatinine Clearance: 68.8 mL/min (by C-G formula based on SCr of 0.73 mg/dL). Liver Function Tests: No results for input(s): AST, ALT, ALKPHOS, BILITOT, PROT, ALBUMIN in the last 168 hours. No results for input(s): LIPASE, AMYLASE in the last 168 hours. No results for input(s): AMMONIA in the last 168 hours. Coagulation profile No results for input(s):  INR, PROTIME in the last 168 hours.  CBC:  Recent Labs Lab 08/09/16 2319  WBC 8.7  NEUTROABS 5.1  HGB 13.5  HCT 39.9  MCV 95.2  PLT 231   Cardiac Enzymes:  Recent Labs Lab 08/10/16 0807  TROPONINI <0.03   BNP: Invalid input(s): POCBNP CBG: No results for input(s): GLUCAP in the last 168 hours. D-Dimer  Recent Labs  08/09/16 2319  DDIMER 0.41   Hgb A1c No results for input(s): HGBA1C in the last 72 hours. Lipid Profile  Recent Labs  08/10/16 0807  CHOL 186  HDL 32*  LDLCALC 138*  TRIG 80  CHOLHDL 5.8    Thyroid function studies No results for input(s): TSH, T4TOTAL, T3FREE, THYROIDAB in the last 72 hours.  Invalid input(s): FREET3 Anemia work up No results for input(s): VITAMINB12, FOLATE, FERRITIN, TIBC, IRON, RETICCTPCT in the last 72 hours. Microbiology No results found for this or any previous visit (from the past 240 hour(s)).   Discharge Instructions:   Discharge Instructions    Diet - low sodium heart healthy    Complete by:  As directed    Discharge instructions    Complete by:  As directed    [facility]   Increase activity slowly    Complete by:  As directed        Medication List    TAKE these medications   ALPRAZolam 1 MG tablet Commonly known as:  XANAX Take 1 mg by mouth 3 (three) times daily.   aspirin 81 MG EC tablet Take 1 tablet (81 mg total) by mouth daily. Start taking on:  08/11/2016   mirtazapine 30 MG tablet Commonly known as:  REMERON Take 30 mg by mouth at bedtime.   Oxycodone HCl 20 MG Tabs Take 20 mg by mouth every 4 (four) hours as needed (for pain).   PROBIOTIC DAILY PO Take 1 tablet by mouth every evening.   rOPINIRole 1 MG tablet Commonly known as:  REQUIP Take 1 mg by mouth at bedtime.      Follow-up Information    [facility] Follow up.   Specialty:  Cardiology Why:  Your stress test is scheduled for tomorrow 08/11/16 at [facility] [address] (same building as [facility] Please arrive at 7am to check in. See attached sheet for instructions. Contact information: [address] [place] [id number] [phone]       Ellsworth LennoxBrittany M Strader, [place] Follow up.   Specialties:  Physician Assistant, Cardiology Why:  You will follow up with Randall AnBrittany Strader PA-C at [facility] [address] on 08/26/16 at 10:15am. Please arrive at 10am to check in. Grenada[place]ittany is one of the PAs that works with our group. Contact  information: [address] [place] [address] [phone]        Colette RibasGOLDING, JOHN CABOT, MD Follow up.   Specialty:  Family Medicine Contact information: [address] [place] [address] [phone]            Time coordinating discharge: 15  Signed:  Kelcy Baeten Juanetta GoslingU Elizabethanne Lusher   [facility] 08/10/2016, 11:35 AM

## 2016-08-10 NOTE — Consult Note (Signed)
Cardiology Consultation Note    Patient ID: Katelyn Smith, MRN: [id number], DOB/AGE: Dec 22, 1965 50 y.o. Admit date: 08/09/2016   Date of Consult: 08/10/2016 Primary Physician: Colette Ribas, MD Primary Cardiologist: New  Chief Complaint: chest pain Reason for Consultation: chest pain Requesting MD: Dr. Maryfrances Bunnell  HPI: Katelyn Smith is a 50 y.o. female with history of anxiety/depression,chronic pain, degenerative risk disease, ongoing tobacco abuse and possible hyperlipidemia (not on any medication) who presented to [facility] with chest pain and facial parasthesias. She was getting ready for bed last night around 10:30pm when she began to feel sensation of left sided chest discomfort, like pressure, associated with feeling suddenly sick on her stomach. She laid down for a moment and began to feel her lips and tongue tingling. She also felt dizzy. She had not eaten anything unusual or done anything different that evening. Symptoms persisted so she had her husband call 911. She was given a whole aspirin and a SL NTG which did improve her pain, but it did come back in the ED at a much lower level prompting repeat Zofran and NTG which helped. Total duration of pain was 30-45 minutes. 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 No palpitations, pre-syncope or syncope. Labs unremarkable except potassium 3.3 (pending repletion), CXR nonacute, VSS, trop neg x 2. EKG shows old RBBB. She remains pain free this AM. Mother had several MIs starting around age 78, now has a valve that is unable to be operated on due to structure. Has smoked 1/2 ppd since around age 78. 1 sister in good health. Admit H/P indicates possible stress echo after ED visit several years back but the patient presently denies h/o of prior cardiac workup.  Past Medical History:  Diagnosis Date  . Anxiety   . Chronic neck pain   .  Depression   . H/O degenerative disc disease   . High cholesterol   . Tobacco abuse       Surgical History:  Past Surgical History:  Procedure Laterality Date  . BACK SURGERY    . TUBAL LIGATION       Home Meds: Prior to Admission medications   Medication Sig Start Date End Date Taking? Authorizing Provider  ALPRAZolam Prudy Feeler) 1 MG tablet Take 1 mg by mouth 3 (three) times daily.   Yes Historical Provider, MD  mirtazapine (REMERON) 30 MG tablet Take 30 mg by mouth at bedtime.   Yes Historical Provider, MD  Oxycodone HCl 20 MG TABS Take 20 mg by mouth every 4 (four) hours as needed (for pain).   Yes Historical Provider, MD  Probiotic Product (PROBIOTIC DAILY PO) Take 1 tablet by mouth every evening.   Yes Historical Provider, MD  rOPINIRole (REQUIP) 1 MG tablet Take 1 mg by mouth at bedtime.   Yes Historical Provider, MD    Inpatient Medications:  . enoxaparin (LOVENOX) injection  40 mg Subcutaneous Q24H  . mirtazapine  30 mg Oral QHS  . potassium chloride  40 mEq Oral Once  . rOPINIRole  1 mg Oral QHS     Allergies:  Allergies  Allergen Reactions  . Mirabegron Hypertension    Social History   Social History  . Marital status: Married    Spouse name: N/A  . Number of children: N/A  . Years of education: N/A   Occupational History  . [employer]  Social History Main Topics  . Smoking status: Current Every Day Smoker    Packs/day: 0.50    Years: 30.00    Types: Cigarettes  . Smokeless tobacco: Never Used     Comment: Since age 50  . Alcohol use No  . Drug use: No  . Sexual activity: Yes    Birth control/ protection: Surgical   Other Topics Concern  . Not on file   Social History Narrative  . No narrative on file     Family History  Problem Relation Age of Onset  . Heart failure Mother   . Heart disease Mother 5553    first MI early 550s  . Cancer Father     Died at 7772  . Cancer Sister   . Stroke Other   . Heart disease Other      Review of  Systems:no LEE.  All other systems reviewed and are otherwise negative except as noted above.  Labs:  Lab Results  Component Value Date   WBC 8.7 08/09/2016   HGB 13.5 08/09/2016   HCT 39.9 08/09/2016   MCV 95.2 08/09/2016   PLT 231 08/09/2016    Recent Labs Lab 08/09/16 2319  NA 139  K 3.3*  CL 107  CO2 23  BUN 6  CREATININE 0.73  CALCIUM 9.0  GLUCOSE 93    Lab Results  Component Value Date   DDIMER 0.41 08/09/2016    Radiology/Studies:  Dg Chest 2 View  Result Date: 08/10/2016 CLINICAL DATA:  50 year old female with left-sided chest pain, and generalized weakness and dizziness. EXAM: CHEST  2 VIEW COMPARISON:  Chest CT dated 06/27/2014 FINDINGS: The lungs are clear. There is no pleural effusion or pneumothorax. The cardiac silhouette is within normal limits. There is osteopenia with mild degenerative changes of the spine. No acute osseous pathology. IMPRESSION: No active cardiopulmonary disease. Electronically Signed   By: Elgie CollardArash  Radparvar M.D.   On: 08/10/2016 00:53    Wt Readings from Last 3 Encounters:  08/10/16 120 lb 6.4 oz (54.6 kg)  06/26/14 140 lb (63.5 kg)  04/24/13 145 lb (65.8 kg)    EKG: NSR RBBB, nonspecific ST-T changes similar to 2015.  Physical Exam: Blood pressure (!) 106/56, pulse 70, temperature 97.7 F (36.5 C), temperature source Oral, resp. rate 18, height 5\' 3"  (1.6 m), weight 120 lb 6.4 oz (54.6 kg), SpO2 98 %. Body mass index is 21.33 kg/m. General: Well developed, well nourished WF, in no acute distress. Head: Normocephalic, atraumatic, sclera non-icteric, no xanthomas, nares are without discharge.  Neck: Negative for carotid bruits. JVD not elevated. Lungs: Clear bilaterally to auscultation without wheezes, rales, or rhonchi. Breathing is unlabored. Heart: RRR with S1 S2. No murmurs, rubs, or gallops appreciated. Abdomen: Soft, non-tender, non-distended with normoactive bowel sounds. No hepatomegaly. No rebound/guarding. No obvious  abdominal masses. Msk:  Strength and tone appear normal for age. Extremities: No clubbing or cyanosis. No edema.  Distal pedal pulses are 2+ and equal bilaterally. Neuro: Alert and oriented X 3. No facial asymmetry. No focal deficit. Moves all extremities spontaneously. Psych:  Responds to questions appropriately with a normal affect.     Assessment and Plan  67F with anxiety/depression, degenerative risk disease, ongoing tobacco abuse and possible hyperlipidemia (not on any medication) who [facility] and facial parasthesias.   1. Chest pain - mixed atypical/typical features. EKG unchanged from 2015 and troponin neg x 2. Exam is benign and vitals are normal. Reviewed in depth  with Dr. Myrtis SerKatz. Will ask nurse to ambulate patient this AM. If 3rd troponin remains negative and she has no further chest pain on ambulation, she may be discharged home with plan for outpatient stress test and close follow-up. Continue ASA 81mg  daily.   2. Ongoing tobacco abuse - cessation advised.  3. Reported HLD - lipid profile pending.  4. Hypokalemia - etiology unclear, borderline K in past as well. Will give 40meq po x 1 now. Consider low dose daily supp @ DC.  Signed, Laurann Montanaayna N Dunn PA-C 08/10/2016, 8:14 AM Pager: [phone] Patient seen and examined. I agree with the assessment and plan as detailed above. See also my additional thoughts below.   I reviewed the patient's symptoms carefully with her. So far there is no definite proof of ischemia. If her third troponin is normal, I am comfortable with the plan for her to be allowed to go home with follow-up outpatient stress testing.  Willa RoughJeffrey Whittley Carandang, MD, [facility] 08/10/2016 8:37 AM

## 2016-08-10 NOTE — Progress Notes (Signed)
Patient admitted after midnight, please see H&P.  Await cardiology recommendations- troponins negative  Marlin CanaryJessica Dona Walby DO

## 2016-08-11 ENCOUNTER — Ambulatory Visit (HOSPITAL_COMMUNITY)
Admission: RE | Admit: 2016-08-11 | Discharge: 2016-08-11 | Disposition: A | Discharge status: Home / Self Care | Payer: BLUE CROSS/BLUE SHIELD | Source: Ambulatory Visit | Attending: Internal Medicine | Admitting: Internal Medicine

## 2016-08-11 DIAGNOSIS — Z8249 Family history of ischemic heart disease and other diseases of the circulatory system: Secondary | ICD-10-CM | POA: Insufficient documentation

## 2016-08-11 DIAGNOSIS — R079 Chest pain, unspecified: Secondary | ICD-10-CM | POA: Diagnosis not present

## 2016-08-11 DIAGNOSIS — R110 Nausea: Secondary | ICD-10-CM | POA: Diagnosis not present

## 2016-08-11 DIAGNOSIS — R0609 Other forms of dyspnea: Secondary | ICD-10-CM | POA: Diagnosis not present

## 2016-08-11 DIAGNOSIS — R0602 Shortness of breath: Secondary | ICD-10-CM | POA: Diagnosis not present

## 2016-08-11 DIAGNOSIS — Z720 Tobacco use: Secondary | ICD-10-CM | POA: Insufficient documentation

## 2016-08-11 DIAGNOSIS — R42 Dizziness and giddiness: Secondary | ICD-10-CM | POA: Diagnosis not present

## 2016-08-11 LAB — MYOCARDIAL PERFUSION IMAGING
CHL CUP RESTING HR STRESS: 68 {beats}/min
LVDIAVOL: 79 mL (ref 46–106)
LVSYSVOL: 30 mL
Peak HR: 100 {beats}/min
SDS: 0
SRS: 0
SSS: 0
TID: 1.1

## 2016-08-11 MED ORDER — TECHNETIUM TC 99M TETROFOSMIN IV KIT
31.7000 | PACK | Freq: Once | INTRAVENOUS | Status: AC | PRN
  Administered 2016-08-11: 31.7 via INTRAVENOUS
  Filled 2016-08-11: qty 32

## 2016-08-11 MED ORDER — REGADENOSON 0.4 MG/5ML IV SOLN
0.4000 mg | Freq: Once | INTRAVENOUS | Status: AC
  Administered 2016-08-11: 0.4 mg via INTRAVENOUS

## 2016-08-11 MED ORDER — TECHNETIUM TC 99M TETROFOSMIN IV KIT
10.6000 | PACK | Freq: Once | INTRAVENOUS | Status: AC | PRN
  Administered 2016-08-11: 10.6 via INTRAVENOUS
  Filled 2016-08-11: qty 11

## 2016-08-11 MED ORDER — AMINOPHYLLINE 25 MG/ML IV SOLN
75.0000 mg | Freq: Once | INTRAVENOUS | Status: AC
  Administered 2016-08-11: 75 mg via INTRAVENOUS

## 2016-08-26 ENCOUNTER — Ambulatory Visit (INDEPENDENT_AMBULATORY_CARE_PROVIDER_SITE_OTHER): Payer: BLUE CROSS/BLUE SHIELD | Admitting: Student

## 2016-08-26 ENCOUNTER — Encounter: Payer: Self-pay | Admitting: Student

## 2016-08-26 ENCOUNTER — Encounter: Payer: Self-pay | Admitting: Cardiology

## 2016-08-26 VITALS — BP 122/76 | HR 65 | Ht 63.0 in | Wt 122.6 lb

## 2016-08-26 DIAGNOSIS — Z01818 Encounter for other preprocedural examination: Secondary | ICD-10-CM | POA: Diagnosis not present

## 2016-08-26 DIAGNOSIS — Z720 Tobacco use: Secondary | ICD-10-CM

## 2016-08-26 DIAGNOSIS — Z79899 Other long term (current) drug therapy: Secondary | ICD-10-CM

## 2016-08-26 DIAGNOSIS — I200 Unstable angina: Secondary | ICD-10-CM

## 2016-08-26 DIAGNOSIS — D689 Coagulation defect, unspecified: Secondary | ICD-10-CM | POA: Diagnosis not present

## 2016-08-26 DIAGNOSIS — E782 Mixed hyperlipidemia: Secondary | ICD-10-CM

## 2016-08-26 DIAGNOSIS — I4510 Unspecified right bundle-branch block: Secondary | ICD-10-CM

## 2016-08-26 LAB — PROTIME-INR
INR: 1
PROTHROMBIN TIME: 10.4 s (ref 9.0–11.5)

## 2016-08-26 LAB — CBC
HCT: 44.1 % (ref 35.0–45.0)
HEMOGLOBIN: 14.8 g/dL (ref 11.7–15.5)
MCH: 32.4 pg (ref 27.0–33.0)
MCHC: 33.6 g/dL (ref 32.0–36.0)
MCV: 96.5 fL (ref 80.0–100.0)
MPV: 11.3 fL (ref 7.5–12.5)
PLATELETS: 256 10*3/uL (ref 140–400)
RBC: 4.57 MIL/uL (ref 3.80–5.10)
RDW: 13.2 % (ref 11.0–15.0)
WBC: 7.8 10*3/uL (ref 3.8–10.8)

## 2016-08-26 LAB — BASIC METABOLIC PANEL
BUN: 10 mg/dL (ref 7–25)
CALCIUM: 9.3 mg/dL (ref 8.6–10.4)
CO2: 26 mmol/L (ref 20–31)
CREATININE: 0.66 mg/dL (ref 0.50–1.05)
Chloride: 106 mmol/L (ref 98–110)
GLUCOSE: 76 mg/dL (ref 65–99)
Potassium: 4.7 mmol/L (ref 3.5–5.3)
Sodium: 140 mmol/L (ref 135–146)

## 2016-08-26 LAB — APTT: aPTT: 29 s (ref 22–34)

## 2016-08-26 MED ORDER — NITROGLYCERIN 0.4 MG SL SUBL: 0.4000 mg | SUBLINGUAL_TABLET | SUBLINGUAL | 3 refills | Status: DC | PRN

## 2016-08-26 NOTE — Patient Instructions (Signed)
Your physician has requested that you have a cardiac catheterization @ [facility] - within 1-2 weeks. Cardiac catheterization is used to diagnose and/or treat various heart conditions. Doctors may recommend this procedure for a number of different reasons. The most common reason is to evaluate chest pain. Chest pain can be a symptom of coronary artery disease (CAD), and cardiac catheterization can show whether plaque is narrowing or blocking your heart's arteries. This procedure is also used to evaluate the valves, as well as measure the blood flow and oxygen levels in different parts of your heart. For further information please visit [email or web address] Please follow instruction sheet, as given.  Following your catheterization, you will not be allowed to drive for 3 days.  No lifting, pushing, or pulling greater that 10 pounds is allowed for 1 week.  You will be required to have the following tests prior to the procedure:  1. Blood work - the blood work can be done no more than 14 days prior to the procedure.  It can be done at any [facility] lab. There is a lab downstairs on the first floor of this building in [address] and one at [address].    Your physician has recommended you make the following change in your medication:  -- TAKE nitroglycerin as needed for chest pain - dissolve 1 tablet under tongue every 5 minutes as needed for a max of 3 doses

## 2016-08-26 NOTE — Progress Notes (Signed)
Cardiology Office Note    Date:  08/26/2016   ID:  Katelyn Smith, DOB 1966/06/24, MRN [id number]  PCP:  Cassell SmilesFUSCO,LAWRENCE J., MD  Cardiologist: Previously seen by Dr.Katz while Inpatient  Chief Complaint  Patient presents with  . Chest Pain    with exertion  . Shortness of Breath    some     History of Present Illness:    Katelyn Smith is a 50 y.o. female with past medical history of degenerative disc disease, tobacco abuse, and anxiety/depression who presents to the office today for hospital follow-up.   She was recently admitted from 12/10 - 08/10/2016 for evaluation of chest pain. She initially presented with chest discomfort and nausea which started prior to getting in bed. There was no association with exertion and her symptoms overall lasted for 30-45 minutes. Cyclic troponin values remained negative and her EKG showed no acute ischemic changes. She ambulated without repeat symptoms and an outpatient stress test was scheduled. This was performed on 12/12 and showed no evidence of ischemia, overall being a low-risk study.   In talking with the patient today, she reports having worsening episodes of chest discomfort for the past two weeks. Says she develops a pressure along her sternum and left-pectoral region which comes about with minimal activity. Has noticed this when vacuuming the floors or walking up one flight of stairs in her home. These symptoms are completely new ever since her presenting episode on 08/09/2016. Her pain is associated with dyspnea and lasts for 10-15 minute intervals. Resolves with rest.  She denies any prior history of HTN or Type 2 DM. Does have a history of HLD which has ben managed by diet, with recent Lipid Panel on 08/10/2016 showing a total cholesterol of 186, HDL 32, and LDL 138. Reports her mother had a "massive heart attack" at age 51. She also has over a 33 pack year tobacco history.    Past Medical History:  Diagnosis Date  . Anxiety   .  Chest pain    a. 07/2016: Stress test showing no evidence of ischemia.   . Chronic neck pain   . Depression   . H/O degenerative disc disease   . High cholesterol   . Tobacco abuse     Past Surgical History:  Procedure Laterality Date  . BACK SURGERY    . TUBAL LIGATION      Current Medications: Outpatient Medications Prior to Visit  Medication Sig Dispense Refill  . ALPRAZolam (XANAX) 1 MG tablet Take 1 mg by mouth 3 (three) times daily.    Marland Kitchen. aspirin EC 81 MG EC tablet Take 1 tablet (81 mg total) by mouth daily.    . mirtazapine (REMERON) 30 MG tablet Take 30 mg by mouth at bedtime.    . Oxycodone HCl 20 MG TABS Take 20 mg by mouth every 4 (four) hours as needed (for pain).    . Probiotic Product (PROBIOTIC DAILY PO) Take 1 tablet by mouth every evening.    Marland Kitchen. rOPINIRole (REQUIP) 1 MG tablet Take 1 mg by mouth at bedtime.     No facility-administered medications prior to visit.      Allergies:   Mirabegron   Social History   Social History  . Marital status: Married    Spouse name: N/A  . Number of children: N/A  . Years of education: N/A   Occupational History  . Cook    Social History Main Topics  . Smoking status: Current Every Day  Smoker    Packs/day: 0.50    Years: 30.00    Types: Cigarettes  . Smokeless tobacco: Never Used     Comment: Since age 50  . Alcohol use No  . Drug use: No  . Sexual activity: Yes    Birth control/ protection: Surgical   Other Topics Concern  . None   Social History Narrative  . None     Family History:  The patient's family history includes Cancer in her father and sister; Heart disease in her other; Heart disease (age of onset: 5553) in her mother; Heart failure in her mother; Stroke in her other.   Review of Systems:   Please see the history of present illness.     General:  No chills, fever, night sweats or weight changes.  Cardiovascular:  No edema, orthopnea, palpitations, paroxysmal nocturnal dyspnea. Positive for  chest pain and dyspnea on exertion. Dermatological: No rash, lesions/masses Respiratory: No cough, dyspnea Urologic: No hematuria, dysuria Abdominal:   No nausea, vomiting, diarrhea, bright red blood per rectum, melena, or hematemesis Neurologic:  No visual changes, wkns, changes in mental status. All other systems reviewed and are otherwise negative except as noted above.   Physical Exam:    VS:  BP 122/76   Pulse 65   Ht 5\' 3"  (1.6 m)   Wt 122 lb 9.6 oz (55.6 kg)   SpO2 97%   BMI 21.72 kg/m    General: Well developed, well nourished Caucasian female appearing in no acute distress. Head: Normocephalic, atraumatic, sclera non-icteric, no xanthomas, nares are without discharge.  Neck: No carotid bruits. JVD not elevated.  Lungs: Respirations regular and unlabored, without wheezes or rales.  Heart: Regular rate and rhythm. No S3 or S4.  No murmur, no rubs, or gallops appreciated. Abdomen: Soft, non-tender, non-distended with normoactive bowel sounds. No hepatomegaly. No rebound/guarding. No obvious abdominal masses. Msk:  Strength and tone appear normal for age. No joint deformities or effusions. Extremities: No clubbing or cyanosis. No edema.  Distal pedal pulses are 2+ bilaterally. Neuro: Alert and oriented X 3. Moves all extremities spontaneously. No focal deficits noted. Psych:  Responds to questions appropriately with a normal affect. Skin: No rashes or lesions noted  Wt Readings from Last 3 Encounters:  08/26/16 122 lb 9.6 oz (55.6 kg)  08/11/16 120 lb (54.4 kg)  08/10/16 120 lb 6.4 oz (54.6 kg)      Studies/Labs Reviewed:   EKG:  EKG is ordered today.  The ekg ordered today demonstrates NSR, HR 64, with known RBBB. No acute ST or T-wave changes as compared to prior tracings.   Recent Labs: 08/09/2016: BUN 6; Creatinine, Ser 0.73; Hemoglobin 13.5; Platelets 231; Potassium 3.3; Sodium 139   Lipid Panel    Component Value Date/Time   CHOL 186 08/10/2016 0807   TRIG  80 08/10/2016 0807   HDL 32 (L) 08/10/2016 0807   CHOLHDL 5.8 08/10/2016 0807   VLDL 16 08/10/2016 0807   LDLCALC 138 (H) 08/10/2016 0807    Additional studies/ records that were reviewed today include:   NST: 07/2016  Nuclear stress EF: 61%. The left ventricular ejection fraction is normal (55-65%).  There was no ST segment deviation noted during stress.  The study is normal.  This is a low risk study.  Assessment:    1. Unstable angina (HCC)   2. RBBB   3. Pre-procedural examination   4. Medication management   5. Tobacco abuse   6. Mixed hyperlipidemia  Plan:   In order of problems listed above:  1. Chest Pain Concerning for Unstable Angina - recently admitted for chest pain and ruled out. Outpatient NST was without evidence of ischemia, overall being a low-risk study.  - she has continued to experience worsening chest discomfort with exertion for the past 2 weeks, associated with dyspnea. She can now not even climb one set of stairs without developing chest discomfort, which resolves with rest. Cardiac risk factors include family history of CAD and a 30 pack-year history.  - with her recent negative stress test and new symptoms concerning for unstable angina, a cardiac catheterization is recommended for definitive evaluation. Discussed with Dr. Duke Salviaandolph (DOD) who was in agreement with this plan. Will schedule within the next 1-2 weeks. Will obtain pre-procedural labs. She was given an Rx for SL NTG. Is aware if her chest pain acutely worsens or she develops symptoms at rest, then she should proceed to the nearest ER for further evaluation.   2. RBBB - chronic. EKG today is without acute changes.   3. Tobacco Abuse - has a 30+ pack year history. Cessation advised.  4. HLD - Lipid Panel on 08/10/2016 showed a total cholesterol of 186, HDL 32, and LDL 138.  - previously on statin therapy but this was discontinued by PCP due to her cholesterol being well-controlled  with diet.  - if significant CAD noted on cath, would need to reinitiate statin therapy as LDL goal would be < 70.   Medication Adjustments/Labs and Tests Ordered: Current medicines are reviewed at length with the patient today.  Concerns regarding medicines are outlined above.  Medication changes, Labs and Tests ordered today are listed in the Patient Instructions below. Patient Instructions  Your physician has requested that you have a cardiac catheterization @ [facility] - within 1-2 weeks. Cardiac catheterization is used to diagnose and/or treat various heart conditions. Doctors may recommend this procedure for a number of different reasons. The most common reason is to evaluate chest pain. Chest pain can be a symptom of coronary artery disease (CAD), and cardiac catheterization can show whether plaque is narrowing or blocking your heart's arteries. This procedure is also used to evaluate the valves, as well as measure the blood flow and oxygen levels in different parts of your heart. For further information please visit [email or web address] Please follow instruction sheet, as given.  Following your catheterization, you will not be allowed to drive for 3 days.  No lifting, pushing, or pulling greater that 10 pounds is allowed for 1 week.  You will be required to have the following tests prior to the procedure:  1. Blood work - the blood work can be done no more than 14 days prior to the procedure.  It can be done at any [facility] lab. There is a lab downstairs on the first floor of this building in [address] and one at [address].  Your physician has recommended you make the following change in your medication:  -- TAKE nitroglycerin as needed for chest pain - dissolve 1 tablet under tongue every 5 minutes as needed for a max of 3 doses   Signed, Ellsworth LennoxBrittany M Dariann Huckaba, [place]  08/26/2016 2:59 PM    [facility] [address], [address]  [place], [place]  [address] Phone: [phone](336) [phone]; Fax: [phone](336) [phone]  [address], [address] [place], [place] [address] Phone: [phone]

## 2016-08-27 ENCOUNTER — Other Ambulatory Visit: Payer: Self-pay | Admitting: *Deleted

## 2016-08-27 DIAGNOSIS — I200 Unstable angina: Secondary | ICD-10-CM

## 2016-09-09 ENCOUNTER — Encounter (HOSPITAL_COMMUNITY)
Admission: RE | Disposition: A | Discharge status: Home / Self Care | Payer: Self-pay | Source: Ambulatory Visit | Attending: Cardiology

## 2016-09-09 ENCOUNTER — Ambulatory Visit (HOSPITAL_COMMUNITY)
Admission: RE | Admit: 2016-09-09 | Discharge: 2016-09-09 | Disposition: A | Discharge status: Home / Self Care | Payer: BLUE CROSS/BLUE SHIELD | Source: Ambulatory Visit | Attending: Cardiology | Admitting: Cardiology

## 2016-09-09 DIAGNOSIS — E782 Mixed hyperlipidemia: Secondary | ICD-10-CM | POA: Diagnosis not present

## 2016-09-09 DIAGNOSIS — Z8249 Family history of ischemic heart disease and other diseases of the circulatory system: Secondary | ICD-10-CM | POA: Insufficient documentation

## 2016-09-09 DIAGNOSIS — Z7982 Long term (current) use of aspirin: Secondary | ICD-10-CM | POA: Insufficient documentation

## 2016-09-09 DIAGNOSIS — F329 Major depressive disorder, single episode, unspecified: Secondary | ICD-10-CM | POA: Diagnosis not present

## 2016-09-09 DIAGNOSIS — M542 Cervicalgia: Secondary | ICD-10-CM | POA: Insufficient documentation

## 2016-09-09 DIAGNOSIS — I200 Unstable angina: Secondary | ICD-10-CM

## 2016-09-09 DIAGNOSIS — G8929 Other chronic pain: Secondary | ICD-10-CM | POA: Diagnosis not present

## 2016-09-09 DIAGNOSIS — I25110 Atherosclerotic heart disease of native coronary artery with unstable angina pectoris: Secondary | ICD-10-CM | POA: Diagnosis not present

## 2016-09-09 DIAGNOSIS — F17210 Nicotine dependence, cigarettes, uncomplicated: Secondary | ICD-10-CM | POA: Insufficient documentation

## 2016-09-09 DIAGNOSIS — Z823 Family history of stroke: Secondary | ICD-10-CM | POA: Insufficient documentation

## 2016-09-09 DIAGNOSIS — F419 Anxiety disorder, unspecified: Secondary | ICD-10-CM | POA: Insufficient documentation

## 2016-09-09 DIAGNOSIS — I4510 Unspecified right bundle-branch block: Secondary | ICD-10-CM | POA: Diagnosis not present

## 2016-09-09 HISTORY — PX: CARDIAC CATHETERIZATION: SHX172

## 2016-09-09 SURGERY — LEFT HEART CATH AND CORONARY ANGIOGRAPHY
Anesthesia: LOCAL

## 2016-09-09 MED ORDER — SODIUM CHLORIDE 0.9 % WEIGHT BASED INFUSION: 1.0000 mL/kg/h | INTRAVENOUS | Status: DC

## 2016-09-09 MED ORDER — SODIUM CHLORIDE 0.9 % WEIGHT BASED INFUSION
3.0000 mL/kg/h | INTRAVENOUS | Status: DC
  Administered 2016-09-09: 3 mL/kg/h via INTRAVENOUS

## 2016-09-09 MED ORDER — SODIUM CHLORIDE 0.9 % IV SOLN: 250.0000 mL | INTRAVENOUS | Status: DC | PRN

## 2016-09-09 MED ORDER — IOPAMIDOL (ISOVUE-370) INJECTION 76%
INTRAVENOUS | Status: AC
  Filled 2016-09-09: qty 100

## 2016-09-09 MED ORDER — SODIUM CHLORIDE 0.9% FLUSH: 3.0000 mL | INTRAVENOUS | Status: DC | PRN

## 2016-09-09 MED ORDER — IOPAMIDOL (ISOVUE-370) INJECTION 76%
INTRAVENOUS | Status: DC | PRN
  Administered 2016-09-09: 70 mL via INTRA_ARTERIAL

## 2016-09-09 MED ORDER — MIDAZOLAM HCL 2 MG/2ML IJ SOLN
INTRAMUSCULAR | Status: AC
  Filled 2016-09-09: qty 2

## 2016-09-09 MED ORDER — VERAPAMIL HCL 2.5 MG/ML IV SOLN
INTRAVENOUS | Status: AC
  Filled 2016-09-09: qty 2

## 2016-09-09 MED ORDER — LIDOCAINE HCL (PF) 1 % IJ SOLN
INTRAMUSCULAR | Status: AC
  Filled 2016-09-09: qty 30

## 2016-09-09 MED ORDER — HEPARIN SODIUM (PORCINE) 1000 UNIT/ML IJ SOLN
INTRAMUSCULAR | Status: AC
  Filled 2016-09-09: qty 1

## 2016-09-09 MED ORDER — HEPARIN (PORCINE) IN NACL 2-0.9 UNIT/ML-% IJ SOLN
INTRAMUSCULAR | Status: DC | PRN
  Administered 2016-09-09: 1000 mL

## 2016-09-09 MED ORDER — SODIUM CHLORIDE 0.9% FLUSH: 3.0000 mL | Freq: Two times a day (BID) | INTRAVENOUS | Status: DC

## 2016-09-09 MED ORDER — LIDOCAINE HCL (PF) 1 % IJ SOLN
INTRAMUSCULAR | Status: DC | PRN
  Administered 2016-09-09: 2 mL via INTRADERMAL

## 2016-09-09 MED ORDER — FENTANYL CITRATE (PF) 100 MCG/2ML IJ SOLN
INTRAMUSCULAR | Status: DC | PRN
  Administered 2016-09-09 (×2): 25 ug via INTRAVENOUS

## 2016-09-09 MED ORDER — HEPARIN (PORCINE) IN NACL 2-0.9 UNIT/ML-% IJ SOLN
INTRAMUSCULAR | Status: AC
  Filled 2016-09-09: qty 1000

## 2016-09-09 MED ORDER — HEPARIN SODIUM (PORCINE) 1000 UNIT/ML IJ SOLN
INTRAMUSCULAR | Status: DC | PRN
  Administered 2016-09-09: 3000 [IU] via INTRAVENOUS

## 2016-09-09 MED ORDER — FENTANYL CITRATE (PF) 100 MCG/2ML IJ SOLN
INTRAMUSCULAR | Status: AC
  Filled 2016-09-09: qty 2

## 2016-09-09 MED ORDER — MIDAZOLAM HCL 2 MG/2ML IJ SOLN
INTRAMUSCULAR | Status: DC | PRN
  Administered 2016-09-09: 1 mg via INTRAVENOUS
  Administered 2016-09-09: 2 mg via INTRAVENOUS

## 2016-09-09 MED ORDER — SODIUM CHLORIDE 0.9 % WEIGHT BASED INFUSION: 1.0000 mL/kg/h | INTRAVENOUS | Status: AC

## 2016-09-09 MED ORDER — ASPIRIN 81 MG PO CHEW
81.0000 mg | CHEWABLE_TABLET | ORAL | Status: AC
  Administered 2016-09-09: 81 mg via ORAL

## 2016-09-09 MED ORDER — ASPIRIN 81 MG PO CHEW
CHEWABLE_TABLET | ORAL | Status: AC
  Administered 2016-09-09: 81 mg via ORAL
  Filled 2016-09-09: qty 1

## 2016-09-09 SURGICAL SUPPLY — 10 items
CATH IMPULSE 5F ANG/FL3.5 (CATHETERS) ×2 IMPLANT
DEVICE RAD COMP TR BAND LRG (VASCULAR PRODUCTS) ×2 IMPLANT
GLIDESHEATH SLEND SS 6F .021 (SHEATH) ×2 IMPLANT
GUIDEWIRE INQWIRE 1.5J.035X260 (WIRE) ×1 IMPLANT
INQWIRE 1.5J .035X260CM (WIRE) ×2
KIT HEART LEFT (KITS) ×2 IMPLANT
PACK CARDIAC CATHETERIZATION (CUSTOM PROCEDURE TRAY) ×2 IMPLANT
SYR MEDRAD MARK V 150ML (SYRINGE) ×2 IMPLANT
TRANSDUCER W/STOPCOCK (MISCELLANEOUS) ×2 IMPLANT
TUBING CIL FLEX 10 FLL-RA (TUBING) ×2 IMPLANT

## 2016-09-09 NOTE — H&P (View-Only) (Signed)
Cardiology Office Note    Date:  08/26/2016   ID:  Katelyn Smith, DOB 1966/06/24, MRN [id number]  PCP:  Cassell SmilesFUSCO,LAWRENCE J., MD  Cardiologist: Previously seen by Dr.Katz while Inpatient  Chief Complaint  Patient presents with  . Chest Pain    with exertion  . Shortness of Breath    some     History of Present Illness:    Katelyn Smith is a 51 y.o. female with past medical history of degenerative disc disease, tobacco abuse, and anxiety/depression who presents to the office today for hospital follow-up.   She was recently admitted from 12/10 - 08/10/2016 for evaluation of chest pain. She initially presented with chest discomfort and nausea which started prior to getting in bed. There was no association with exertion and her symptoms overall lasted for 30-45 minutes. Cyclic troponin values remained negative and her EKG showed no acute ischemic changes. She ambulated without repeat symptoms and an outpatient stress test was scheduled. This was performed on 12/12 and showed no evidence of ischemia, overall being a low-risk study.   In talking with the patient today, she reports having worsening episodes of chest discomfort for the past two weeks. Says she develops a pressure along her sternum and left-pectoral region which comes about with minimal activity. Has noticed this when vacuuming the floors or walking up one flight of stairs in her home. These symptoms are completely new ever since her presenting episode on 08/09/2016. Her pain is associated with dyspnea and lasts for 10-15 minute intervals. Resolves with rest.  She denies any prior history of HTN or Type 2 DM. Does have a history of HLD which has ben managed by diet, with recent Lipid Panel on 08/10/2016 showing a total cholesterol of 186, HDL 32, and LDL 138. Reports her mother had a "massive heart attack" at age 52. She also has over a 33 pack year tobacco history.    Past Medical History:  Diagnosis Date  . Anxiety   .  Chest pain    a. 07/2016: Stress test showing no evidence of ischemia.   . Chronic neck pain   . Depression   . H/O degenerative disc disease   . High cholesterol   . Tobacco abuse     Past Surgical History:  Procedure Laterality Date  . BACK SURGERY    . TUBAL LIGATION      Current Medications: Outpatient Medications Prior to Visit  Medication Sig Dispense Refill  . ALPRAZolam (XANAX) 1 MG tablet Take 1 mg by mouth 3 (three) times daily.    Marland Kitchen. aspirin EC 81 MG EC tablet Take 1 tablet (81 mg total) by mouth daily.    . mirtazapine (REMERON) 30 MG tablet Take 30 mg by mouth at bedtime.    . Oxycodone HCl 20 MG TABS Take 20 mg by mouth every 4 (four) hours as needed (for pain).    . Probiotic Product (PROBIOTIC DAILY PO) Take 1 tablet by mouth every evening.    Marland Kitchen. rOPINIRole (REQUIP) 1 MG tablet Take 1 mg by mouth at bedtime.     No facility-administered medications prior to visit.      Allergies:   Mirabegron   Social History   Social History  . Marital status: Married    Spouse name: N/A  . Number of children: N/A  . Years of education: N/A   Occupational History  . Cook    Social History Main Topics  . Smoking status: Current Every Day  Smoker    Packs/day: 0.50    Years: 30.00    Types: Cigarettes  . Smokeless tobacco: Never Used     Comment: Since age 51  . Alcohol use No  . Drug use: No  . Sexual activity: Yes    Birth control/ protection: Surgical   Other Topics Concern  . None   Social History Narrative  . None     Family History:  The patient's family history includes Cancer in her father and sister; Heart disease in her other; Heart disease (age of onset: 5553) in her mother; Heart failure in her mother; Stroke in her other.   Review of Systems:   Please see the history of present illness.     General:  No chills, fever, night sweats or weight changes.  Cardiovascular:  No edema, orthopnea, palpitations, paroxysmal nocturnal dyspnea. Positive for  chest pain and dyspnea on exertion. Dermatological: No rash, lesions/masses Respiratory: No cough, dyspnea Urologic: No hematuria, dysuria Abdominal:   No nausea, vomiting, diarrhea, bright red blood per rectum, melena, or hematemesis Neurologic:  No visual changes, wkns, changes in mental status. All other systems reviewed and are otherwise negative except as noted above.   Physical Exam:    VS:  BP 122/76   Pulse 65   Ht 5\' 3"  (1.6 m)   Wt 122 lb 9.6 oz (55.6 kg)   SpO2 97%   BMI 21.72 kg/m    General: Well developed, well nourished Caucasian female appearing in no acute distress. Head: Normocephalic, atraumatic, sclera non-icteric, no xanthomas, nares are without discharge.  Neck: No carotid bruits. JVD not elevated.  Lungs: Respirations regular and unlabored, without wheezes or rales.  Heart: Regular rate and rhythm. No S3 or S4.  No murmur, no rubs, or gallops appreciated. Abdomen: Soft, non-tender, non-distended with normoactive bowel sounds. No hepatomegaly. No rebound/guarding. No obvious abdominal masses. Msk:  Strength and tone appear normal for age. No joint deformities or effusions. Extremities: No clubbing or cyanosis. No edema.  Distal pedal pulses are 2+ bilaterally. Neuro: Alert and oriented X 3. Moves all extremities spontaneously. No focal deficits noted. Psych:  Responds to questions appropriately with a normal affect. Skin: No rashes or lesions noted  Wt Readings from Last 3 Encounters:  08/26/16 122 lb 9.6 oz (55.6 kg)  08/11/16 120 lb (54.4 kg)  08/10/16 120 lb 6.4 oz (54.6 kg)      Studies/Labs Reviewed:   EKG:  EKG is ordered today.  The ekg ordered today demonstrates NSR, HR 64, with known RBBB. No acute ST or T-wave changes as compared to prior tracings.   Recent Labs: 08/09/2016: BUN 6; Creatinine, Ser 0.73; Hemoglobin 13.5; Platelets 231; Potassium 3.3; Sodium 139   Lipid Panel    Component Value Date/Time   CHOL 186 08/10/2016 0807   TRIG  80 08/10/2016 0807   HDL 32 (L) 08/10/2016 0807   CHOLHDL 5.8 08/10/2016 0807   VLDL 16 08/10/2016 0807   LDLCALC 138 (H) 08/10/2016 0807    Additional studies/ records that were reviewed today include:   NST: 07/2016  Nuclear stress EF: 61%. The left ventricular ejection fraction is normal (55-65%).  There was no ST segment deviation noted during stress.  The study is normal.  This is a low risk study.  Assessment:    1. Unstable angina (HCC)   2. RBBB   3. Pre-procedural examination   4. Medication management   5. Tobacco abuse   6. Mixed hyperlipidemia  Plan:   In order of problems listed above:  1. Chest Pain Concerning for Unstable Angina - recently admitted for chest pain and ruled out. Outpatient NST was without evidence of ischemia, overall being a low-risk study.  - she has continued to experience worsening chest discomfort with exertion for the past 2 weeks, associated with dyspnea. She can now not even climb one set of stairs without developing chest discomfort, which resolves with rest. Cardiac risk factors include family history of CAD and a 30 pack-year history.  - with her recent negative stress test and new symptoms concerning for unstable angina, a cardiac catheterization is recommended for definitive evaluation. Discussed with Dr. Duke Salviaandolph (DOD) who was in agreement with this plan. Will schedule within the next 1-2 weeks. Will obtain pre-procedural labs. She was given an Rx for SL NTG. Is aware if her chest pain acutely worsens or she develops symptoms at rest, then she should proceed to the nearest ER for further evaluation.   2. RBBB - chronic. EKG today is without acute changes.   3. Tobacco Abuse - has a 30+ pack year history. Cessation advised.  4. HLD - Lipid Panel on 08/10/2016 showed a total cholesterol of 186, HDL 32, and LDL 138.  - previously on statin therapy but this was discontinued by PCP due to her cholesterol being well-controlled  with diet.  - if significant CAD noted on cath, would need to reinitiate statin therapy as LDL goal would be < 70.   Medication Adjustments/Labs and Tests Ordered: Current medicines are reviewed at length with the patient today.  Concerns regarding medicines are outlined above.  Medication changes, Labs and Tests ordered today are listed in the Patient Instructions below. Patient Instructions  Your physician has requested that you have a cardiac catheterization @ [facility] - within 1-2 weeks. Cardiac catheterization is used to diagnose and/or treat various heart conditions. Doctors may recommend this procedure for a number of different reasons. The most common reason is to evaluate chest pain. Chest pain can be a symptom of coronary artery disease (CAD), and cardiac catheterization can show whether plaque is narrowing or blocking your heart's arteries. This procedure is also used to evaluate the valves, as well as measure the blood flow and oxygen levels in different parts of your heart. For further information please visit [email or web address] Please follow instruction sheet, as given.  Following your catheterization, you will not be allowed to drive for 3 days.  No lifting, pushing, or pulling greater that 10 pounds is allowed for 1 week.  You will be required to have the following tests prior to the procedure:  1. Blood work - the blood work can be done no more than 14 days prior to the procedure.  It can be done at any [facility] lab. There is a lab downstairs on the first floor of this building in [address] and one at [address].  Your physician has recommended you make the following change in your medication:  -- TAKE nitroglycerin as needed for chest pain - dissolve 1 tablet under tongue every 5 minutes as needed for a max of 3 doses   Signed, Ellsworth LennoxBrittany M Allyssa Abruzzese, [place]  08/26/2016 2:59 PM    [facility] [address]  [place], [place]  [address] Phone: [phone](336) [phone]; Fax: [phone]) [phone]  [address] [place], [place] [address] Phone: [phone]

## 2016-09-09 NOTE — Interval H&P Note (Signed)
History and Physical Interval Note:  09/09/2016 9:52 AM  Katelyn Smith  has presented today for surgery, with the diagnosis of cp  The various methods of treatment have been discussed with the patient and family. After consideration of risks, benefits and other options for treatment, the patient has consented to  Procedure(s): Left Heart Cath and Coronary Angiography (N/A) as a surgical intervention .  The patient's history has been reviewed, patient examined, no change in status, stable for surgery.  I have reviewed the patient's chart and labs.  Questions were answered to the patient's satisfaction.   Cath Lab Visit (complete for each Cath Lab visit)  Clinical Evaluation Leading to the Procedure:   ACS: Yes.    Non-ACS:    Anginal Classification: CCS IV  Anti-ischemic medical therapy: Minimal Therapy (1 class of medications)  Non-Invasive Test Results: No non-invasive testing performed  Prior CABG: No previous CABG        Theron Aristaeter [facility]JordanMD,FACC 09/09/2016 9:52 AM

## 2016-09-09 NOTE — Discharge Instructions (Signed)

## 2016-09-10 ENCOUNTER — Encounter (HOSPITAL_COMMUNITY): Payer: Self-pay | Admitting: Cardiology

## 2016-09-14 MED FILL — Verapamil HCl IV Soln 2.5 MG/ML: INTRAVENOUS | Qty: 5 | Status: AC

## 2016-09-17 ENCOUNTER — Emergency Department (HOSPITAL_COMMUNITY)
Admission: EM | Admit: 2016-09-17 | Discharge: 2016-09-17 | Disposition: A | Discharge status: Home / Self Care | Payer: BLUE CROSS/BLUE SHIELD | Attending: Emergency Medicine | Admitting: Emergency Medicine

## 2016-09-17 ENCOUNTER — Encounter (HOSPITAL_COMMUNITY): Payer: Self-pay | Admitting: Emergency Medicine

## 2016-09-17 DIAGNOSIS — R1032 Left lower quadrant pain: Secondary | ICD-10-CM | POA: Insufficient documentation

## 2016-09-17 DIAGNOSIS — R1031 Right lower quadrant pain: Secondary | ICD-10-CM | POA: Insufficient documentation

## 2016-09-17 DIAGNOSIS — Z79899 Other long term (current) drug therapy: Secondary | ICD-10-CM | POA: Insufficient documentation

## 2016-09-17 DIAGNOSIS — R197 Diarrhea, unspecified: Secondary | ICD-10-CM | POA: Diagnosis not present

## 2016-09-17 DIAGNOSIS — Z7982 Long term (current) use of aspirin: Secondary | ICD-10-CM | POA: Insufficient documentation

## 2016-09-17 DIAGNOSIS — R112 Nausea with vomiting, unspecified: Secondary | ICD-10-CM | POA: Diagnosis not present

## 2016-09-17 LAB — COMPREHENSIVE METABOLIC PANEL
ALT: 11 U/L — ABNORMAL LOW (ref 14–54)
ANION GAP: 11 (ref 5–15)
AST: 13 U/L — ABNORMAL LOW (ref 15–41)
Albumin: 4.3 g/dL (ref 3.5–5.0)
Alkaline Phosphatase: 67 U/L (ref 38–126)
BUN: 20 mg/dL (ref 6–20)
CO2: 25 mmol/L (ref 22–32)
Calcium: 9.5 mg/dL (ref 8.9–10.3)
Chloride: 105 mmol/L (ref 101–111)
Creatinine, Ser: 0.73 mg/dL (ref 0.44–1.00)
Glucose, Bld: 105 mg/dL — ABNORMAL HIGH (ref 65–99)
Potassium: 3.2 mmol/L — ABNORMAL LOW (ref 3.5–5.1)
SODIUM: 141 mmol/L (ref 135–145)
TOTAL PROTEIN: 7.7 g/dL (ref 6.5–8.1)
Total Bilirubin: 0.6 mg/dL (ref 0.3–1.2)

## 2016-09-17 LAB — URINALYSIS, ROUTINE W REFLEX MICROSCOPIC
BILIRUBIN URINE: NEGATIVE
Bacteria, UA: NONE SEEN
Glucose, UA: NEGATIVE mg/dL
KETONES UR: 80 mg/dL — AB
LEUKOCYTES UA: NEGATIVE
NITRITE: NEGATIVE
PROTEIN: 30 mg/dL — AB
Specific Gravity, Urine: 1.029 (ref 1.005–1.030)
pH: 5 (ref 5.0–8.0)

## 2016-09-17 LAB — CBC
HCT: 48.2 % — ABNORMAL HIGH (ref 36.0–46.0)
HEMOGLOBIN: 17.4 g/dL — AB (ref 12.0–15.0)
MCH: 33.9 pg (ref 26.0–34.0)
MCHC: 36.1 g/dL — ABNORMAL HIGH (ref 30.0–36.0)
MCV: 94 fL (ref 78.0–100.0)
Platelets: 312 10*3/uL (ref 150–400)
RBC: 5.13 MIL/uL — AB (ref 3.87–5.11)
RDW: 12.8 % (ref 11.5–15.5)
WBC: 12.8 10*3/uL — AB (ref 4.0–10.5)

## 2016-09-17 LAB — LIPASE, BLOOD: Lipase: 27 U/L (ref 11–51)

## 2016-09-17 MED ORDER — ONDANSETRON HCL 8 MG PO TABS: 8.0000 mg | ORAL_TABLET | Freq: Three times a day (TID) | ORAL | 0 refills | Status: DC | PRN

## 2016-09-17 MED ORDER — ONDANSETRON 4 MG PO TBDP
ORAL_TABLET | ORAL | Status: AC
  Filled 2016-09-17: qty 1

## 2016-09-17 MED ORDER — ONDANSETRON HCL 4 MG/2ML IJ SOLN
4.0000 mg | Freq: Once | INTRAMUSCULAR | Status: AC
  Administered 2016-09-17: 4 mg via INTRAVENOUS
  Filled 2016-09-17: qty 2

## 2016-09-17 MED ORDER — OXYCODONE-ACETAMINOPHEN 5-325 MG PO TABS
2.0000 | ORAL_TABLET | ORAL | 0 refills | Status: DC | PRN
Start: 2016-09-17 — End: 2016-09-24

## 2016-09-17 MED ORDER — SODIUM CHLORIDE 0.9 % IV BOLUS (SEPSIS)
1000.0000 mL | Freq: Once | INTRAVENOUS | Status: AC
  Administered 2016-09-17: 1000 mL via INTRAVENOUS

## 2016-09-17 MED ORDER — ONDANSETRON 4 MG PO TBDP
4.0000 mg | ORAL_TABLET | Freq: Once | ORAL | Status: AC | PRN
  Administered 2016-09-17: 4 mg via ORAL

## 2016-09-17 MED ORDER — ONDANSETRON 4 MG PREPACK (~~LOC~~): 1.0000 | ORAL_TABLET | Freq: Three times a day (TID) | ORAL | 0 refills | Status: DC | PRN

## 2016-09-17 MED ORDER — FENTANYL CITRATE (PF) 100 MCG/2ML IJ SOLN
100.0000 ug | INTRAMUSCULAR | Status: DC | PRN
  Administered 2016-09-17 (×2): 100 ug via INTRAVENOUS
  Filled 2016-09-17 (×2): qty 2

## 2016-09-17 NOTE — ED Notes (Signed)
Patient was given peanut butter crackers and ginger ale for consumption.

## 2016-09-17 NOTE — Discharge Instructions (Signed)
Gradually advance your diet over the next 2 or 3 days.

## 2016-09-17 NOTE — ED Triage Notes (Signed)
Pt reports abd pain X3 days. Has had 2 episodes of V/ and 12 episodes of D/ today. Intermittent chills and fever. No home medications. States generalized weakness.

## 2016-09-17 NOTE — ED Provider Notes (Signed)
[facility]-EMER[facility]ENCY DEPT Provider Note   CSN: [id number] Arrival date & time: 09/17/16  1347     History   Chief Complaint Chief Complaint  Patient presents with  . Abdominal Pain    HPI Katelyn Smith is a 51 y.o. female.  She resents for evaluation of nausea, vomiting, diarrhea, present for 3 days. She is unable to tolerate her medications, including chronic narcotics, because of the vomiting. She feels like she has had some fever and chills. She denies blood in emesis or stool. She denies weakness, dizziness, cough, chest pain or back pain. There are no other known modifying factors.     HPI  Past Medical History:  Diagnosis Date  . Anxiety   . Chest pain    a. 07/2016: Stress test showing no evidence of ischemia.   . Chronic neck pain   . Depression   . H/O degenerative disc disease   . High cholesterol   . Tobacco abuse     Patient Active Problem List   Diagnosis Date Noted  . Unstable angina (HCC)   . Chest pain 08/10/2016  . Hyperlipidemia 08/10/2016  . Chronic pain syndrome 08/10/2016  . Tobacco abuse 08/10/2016  . Hypokalemia 08/10/2016  . Urge incontinence 12/15/2012  . Chronic low back pain 12/15/2012    Past Surgical History:  Procedure Laterality Date  . BACK SURGERY    . CARDIAC CATHETERIZATION N/A 09/09/2016   Procedure: Left Heart Cath and Coronary Angiography;  Surgeon: Peter M Swaziland, MD;  Location: [facility] INVASIVE CV LAB;  Service: Cardiovascular;  Laterality: N/A;  . CARDIAC CATHETERIZATION  09/09/2016  . TUBAL LIGATION      OB History    Gravida Para Term Preterm AB Living   3 3 3     3    SAB TAB Ectopic Multiple Live Births                   Home Medications    Prior to Admission medications   Medication Sig Start Date End Date Taking? Authorizing Provider  ALPRAZolam Prudy Feeler) 1 MG tablet Take 1 mg by mouth daily as needed for anxiety or sleep.    Yes Historical Provider, MD  aspirin EC 81 MG EC tablet Take 1 tablet (81 mg total) by  mouth daily. 08/11/16  Yes Joseph Art, DO  mirtazapine (REMERON) 30 MG tablet Take 30 mg by mouth at bedtime.   Yes Historical Provider, MD  nitroGLYCERIN (NITROSTAT) 0.4 MG SL tablet Place 1 tablet (0.4 mg total) under the tongue every 5 (five) minutes as needed for chest pain. MAX 3 doses 08/26/16 11/24/16 Yes Ellsworth Lennox, PA  Oxycodone HCl 20 MG TABS Take 20 mg by mouth every 4 (four) hours as needed (for pain).   Yes Historical Provider, MD  Probiotic Product (PROBIOTIC DAILY PO) Take 1 tablet by mouth every evening.   Yes Historical Provider, MD  rOPINIRole (REQUIP) 1 MG tablet Take 1 mg by mouth at bedtime.   Yes Historical Provider, MD  ondansetron (ZOFRAN) 4 mg TABS tablet Take 4 tablets by mouth every 8 (eight) hours as needed. 09/17/16   Mancel Bale, MD  ondansetron (ZOFRAN) 8 MG tablet Take 1 tablet (8 mg total) by mouth every 8 (eight) hours as needed for nausea or vomiting. 09/17/16   Mancel Bale, MD  oxyCODONE-acetaminophen (PERCOCET/ROXICET) 5-325 MG tablet Take 2 tablets by mouth every 4 (four) hours as needed for severe pain. 09/17/16   Mancel Bale, MD  Family History Family History  Problem Relation Age of Onset  . Heart failure Mother   . Heart disease Mother 2453    first MI early 2950s  . Cancer Father     Died at 6072  . Cancer Sister   . Stroke Other   . Heart disease Other     Social History Social History  Substance Use Topics  . Smoking status: Current Every Day Smoker    Packs/day: 0.50    Years: 30.00    Types: Cigarettes  . Smokeless tobacco: Never Used     Comment: Since age 51  . Alcohol use No     Allergies   Mirabegron   Review of Systems Review of Systems  All other systems reviewed and are negative.    Physical Exam Updated Vital Signs BP 138/67 (BP Location: Left Arm)   Pulse (!) 59   Temp 98.4 F (36.9 C) (Oral)   Resp 18   Ht 5\' 3"  (1.6 m)   Wt 120 lb (54.4 kg)   SpO2 99%   BMI 21.26 kg/m   Physical Exam    Constitutional: She is oriented to person, place, and time. She appears well-developed and well-nourished. She has a sickly appearance. She appears distressed (Uncomfortable).  HENT:  Head: Normocephalic and atraumatic.  Eyes: Conjunctivae and EOM are normal. Pupils are equal, round, and reactive to light.  Neck: Normal range of motion and phonation normal. Neck supple.  Cardiovascular: Normal rate and regular rhythm.   Pulmonary/Chest: Effort normal and breath sounds normal. She exhibits no tenderness.  Abdominal: Soft. She exhibits no distension and no mass. There is tenderness (Bilateral lower quadrants, mild). There is no rebound and no guarding. No hernia.  Musculoskeletal: Normal range of motion.  Neurological: She is alert and oriented to person, place, and time. She exhibits normal muscle tone.  Skin: Skin is warm and dry.  Psychiatric: She has a normal mood and affect. Her behavior is normal. Judgment and thought content normal.  Nursing note and vitals reviewed.    ED Treatments / Results  Labs (all labs ordered are listed, but only abnormal results are displayed) Labs Reviewed  COMPREHENSIVE METABOLIC PANEL - Abnormal; Notable for the following:       Result Value   Potassium 3.2 (*)    Glucose, Bld 105 (*)    AST 13 (*)    ALT 11 (*)    All other components within normal limits  CBC - Abnormal; Notable for the following:    WBC 12.8 (*)    RBC 5.13 (*)    Hemoglobin 17.4 (*)    HCT 48.2 (*)    MCHC 36.1 (*)    All other components within normal limits  URINALYSIS, ROUTINE W REFLEX MICROSCOPIC - Abnormal; Notable for the following:    Hgb urine dipstick SMALL (*)    Ketones, ur 80 (*)    Protein, ur 30 (*)    All other components within normal limits  LIPASE, BLOOD    EKG  EKG Interpretation None       Radiology No results found.  Procedures Procedures (including critical care time)  Medications Ordered in ED Medications  fentaNYL (SUBLIMAZE)  injection 100 mcg (100 mcg Intravenous Given 09/17/16 1831)  ondansetron (ZOFRAN-ODT) disintegrating tablet 4 mg (4 mg Oral Given 09/17/16 1432)  sodium chloride 0.9 % bolus 1,000 mL (0 mLs Intravenous Stopped 09/17/16 1825)  ondansetron (ZOFRAN) injection 4 mg (4 mg Intravenous Given 09/17/16 1718)  Initial Impression / Assessment and Plan / ED Course  I have reviewed the triage vital signs and the nursing notes.  Pertinent labs & imaging results that were available during my care of the patient were reviewed by me and considered in my medical decision making (see chart for details).  Clinical Course as of Sep 17 2210  Thu Sep 17, 2016  2014 She states that she feels better. She does not have any antiemetics at home. She states that she also has only one dose of pain medicine, left, because she used them up as she was vomiting over the last few days. She can get her refill, in one week.  [EW]    Clinical Course User Index [EW] Mancel Bale, MD    Medications  fentaNYL (SUBLIMAZE) injection 100 mcg (100 mcg Intravenous Given 09/17/16 1831)  ondansetron (ZOFRAN-ODT) disintegrating tablet 4 mg (4 mg Oral Given 09/17/16 1432)  sodium chloride 0.9 % bolus 1,000 mL (0 mLs Intravenous Stopped 09/17/16 1825)  ondansetron (ZOFRAN) injection 4 mg (4 mg Intravenous Given 09/17/16 1718)    Patient Vitals for the past 24 hrs:  BP Temp Temp src Pulse Resp SpO2 Height Weight  09/17/16 1824 138/67 - Oral (!) 59 18 99 % - -  09/17/16 1427 135/89 98.4 F (36.9 C) Oral 87 18 99 % 5\' 3"  (1.6 m) 120 lb (54.4 kg)    At D/C Reevaluation with update and discussion. After initial assessment and treatment, an updated evaluation reveals She remains comfortable and is now tolerating oral fluids. Findings discussed with the patient and all questions answered. Raissa Dam L    Final Clinical Impressions(s) / ED Diagnoses   Final diagnoses:  Nausea vomiting and diarrhea    Nonspecific, nausea, vomiting,  diarrhea, improved after treatment. Patient stated that she was out of her chronic pain medication, and could not get a refill for another week. She will be given 6 to go.   Nursing Notes Reviewed/ Care Coordinated Applicable Imaging Reviewed Interpretation of Laboratory Data incorporated into ED treatment  The patient appears reasonably screened and/or stabilized for discharge and I doubt any other medical condition or other [facility] requiring further screening, evaluation, or treatment in the ED at this time prior to discharge.  Plan: Home Medications- continue; Home Treatments- rest; return here if the recommended treatment, does not improve the symptoms; Recommended follow up- PCP prn   New Prescriptions Discharge Medication List as of 09/17/2016  8:19 PM    START taking these medications   Details  !! ondansetron (ZOFRAN) 4 mg TABS tablet Take 4 tablets by mouth every 8 (eight) hours as needed., Starting Thu 09/17/2016, Print    !! ondansetron (ZOFRAN) 8 MG tablet Take 1 tablet (8 mg total) by mouth every 8 (eight) hours as needed for nausea or vomiting., Starting Thu 09/17/2016, Print    oxyCODONE-acetaminophen (PERCOCET/ROXICET) 5-325 MG tablet Take 2 tablets by mouth every 4 (four) hours as needed for severe pain., Starting Thu 09/17/2016, Print     !! - Potential duplicate medications found. Please discuss with provider.       Mancel Bale, MD 09/17/16 2213

## 2016-09-17 NOTE — ED Notes (Signed)
Patient was given a prepackage of zofran and percocet and given instructions of use.

## 2016-09-21 MED FILL — Oxycodone w/ Acetaminophen Tab 5-325 MG: ORAL | Qty: 6 | Status: AC

## 2016-09-21 MED FILL — Ondansetron HCl Tab 4 MG: ORAL | Qty: 4 | Status: AC

## 2016-09-23 NOTE — Progress Notes (Signed)
Cardiology Office Note    Date:  09/24/2016   ID:  Katelyn, Smith 1966/03/06, MRN [id number]  PCP:  Cassell Smiles., MD  Cardiologist: Previously Dr. Myrtis Ser --> Now Dr. Duke Salvia, but lives in [place], [place]   Chief Complaint  Patient presents with  . Follow-up    post cath     History of Present Illness:    Katelyn Smith is a 51 y.o. female with past medical history of degenerative disc disease, tobacco abuse, and anxiety/depression who presents to the office today for follow-up of her recent cardiac catheterization.   Was seen by myself on 12/27 for hospital follow-up of chest pain. A NST had been performed while admitted and low-risk. At the time of her office visit, she reported having worsening episodes of chest discomfort with minimal activity, associated with dyspnea, and resolving with rest. With her continued symptoms, a cardiac catheterization was recommended for definitive evaluation.   This was performed on 09/09/2016 and showed 40% Ost RCA stenosis, 70% Lateral 3rd Mrg, and 80% Acute Mrg stenosis. The Mrg branch was small, therefore medical therapy and risk factor modification were recommended.   In talking with the patient today, she reports continuing to have mild episodes of chest discomfort several times per week. She has only had to use sublingual nitroglycerin once, but reports this completely resolved her symptoms.   She suffered from the flu following her cath, with fevers, chills, body aches, nausea, vomiting, and diarrhea. This lasted for over a week and she is now gradually improving. Has been less active than normal secondary to her symptoms.  She denies any complications from her right radial catheterization site. Still smoking 0.5 ppd.    Past Medical History:  Diagnosis Date  . Anxiety   . Chest pain    a. 07/2016: Stress test showing no evidence of ischemia. b. cath 08/2016: 40% Ost RCA stenosis, 70% Lateral 3rd Mrg, and 80% Acute Mrg (too small  for intervention, therefore medical therapy recommended).   . Chronic neck pain   . Depression   . H/O degenerative disc disease   . High cholesterol   . Tobacco abuse     Past Surgical History:  Procedure Laterality Date  . BACK SURGERY    . CARDIAC CATHETERIZATION N/A 09/09/2016   Procedure: Left Heart Cath and Coronary Angiography;  Surgeon: Peter M Swaziland, MD;  Location: [facility] INVASIVE CV LAB;  Service: Cardiovascular;  Laterality: N/A;  . CARDIAC CATHETERIZATION  09/09/2016  . TUBAL LIGATION      Current Medications: Outpatient Medications Prior to Visit  Medication Sig Dispense Refill  . ALPRAZolam (XANAX) 1 MG tablet Take 1 mg by mouth daily as needed for anxiety or sleep.     Marland Kitchen aspirin EC 81 MG EC tablet Take 1 tablet (81 mg total) by mouth daily.    . mirtazapine (REMERON) 30 MG tablet Take 30 mg by mouth at bedtime.    . nitroGLYCERIN (NITROSTAT) 0.4 MG SL tablet Place 1 tablet (0.4 mg total) under the tongue every 5 (five) minutes as needed for chest pain. MAX 3 doses 25 tablet 3  . ondansetron (ZOFRAN) 4 mg TABS tablet Take 4 tablets by mouth every 8 (eight) hours as needed. 4 tablet 0  . Oxycodone HCl 20 MG TABS Take 20 mg by mouth every 4 (four) hours as needed (for pain).    . Probiotic Product (PROBIOTIC DAILY PO) Take 1 tablet by mouth every evening.    Marland Kitchen rOPINIRole (  REQUIP) 1 MG tablet Take 1 mg by mouth at bedtime.    . ondansetron (ZOFRAN) 8 MG tablet Take 1 tablet (8 mg total) by mouth every 8 (eight) hours as needed for nausea or vomiting. (Patient not taking: Reported on 09/24/2016) 20 tablet 0  . oxyCODONE-acetaminophen (PERCOCET/ROXICET) 5-325 MG tablet Take 2 tablets by mouth every 4 (four) hours as needed for severe pain. (Patient not taking: Reported on 09/24/2016) 6 tablet 0   No facility-administered medications prior to visit.      Allergies:   Mirabegron   Social History   Social History  . Marital status: Married    Spouse name: N/A  . Number of  children: N/A  . Years of education: N/A   Occupational History  . Cook    Social History Main Topics  . Smoking status: Current Every Day Smoker    Packs/day: 0.50    Years: 30.00    Types: Cigarettes  . Smokeless tobacco: Never Used     Comment: Since age 8  . Alcohol use No  . Drug use: No  . Sexual activity: Yes    Birth control/ protection: Surgical   Other Topics Concern  . None   Social History Narrative  . None     Family History:  The patient's family history includes Cancer in her father and sister; Heart disease in her other; Heart disease (age of onset: 61) in her mother; Heart failure in her mother; Stroke in her other.   Review of Systems:   Please see the history of present illness.     General:  No chills, fever, night sweats or weight changes.  Cardiovascular:  No dyspnea on exertion, edema, orthopnea, palpitations, paroxysmal nocturnal dyspnea. Positive for chest pain.  Dermatological: No rash, lesions/masses Respiratory: No cough, dyspnea Urologic: No hematuria, dysuria Abdominal:   No nausea, vomiting, diarrhea, bright red blood per rectum, melena, or hematemesis Neurologic:  No visual changes, wkns, changes in mental status. All other systems reviewed and are otherwise negative except as noted above.   Physical Exam:    VS:  BP 117/76 (BP Location: Right Arm, Patient Position: Sitting, Cuff Size: Normal)   Pulse 86   Ht 5\' 3"  (1.6 m)   Wt 119 lb (54 kg)   SpO2 95%   BMI 21.08 kg/m    General: Well developed, well nourished Caucasian female appearing in no acute distress. Head: Normocephalic, atraumatic, sclera non-icteric, no xanthomas, nares are without discharge.  Neck: No carotid bruits. JVD not elevated.  Lungs: Respirations regular and unlabored, without wheezes or rales.  Heart: Regular rate and rhythm. No S3 or S4.  No murmur, no rubs, or gallops appreciated. Abdomen: Soft, non-tender, non-distended with normoactive bowel sounds. No  hepatomegaly. No rebound/guarding. No obvious abdominal masses. Msk:  Strength and tone appear normal for age. No joint deformities or effusions. Extremities: No clubbing or cyanosis. No edema.  Distal pedal pulses are 2+ bilaterally. Right radial cath site without ecchymosis or evidence of a hematoma.  Neuro: Alert and oriented X 3. Moves all extremities spontaneously. No focal deficits noted. Psych:  Responds to questions appropriately with a normal affect. Skin: No rashes or lesions noted  Wt Readings from Last 3 Encounters:  09/24/16 119 lb (54 kg)  09/17/16 120 lb (54.4 kg)  09/09/16 122 lb (55.3 kg)    Studies/Labs Reviewed:   EKG:  EKG is not ordered today.    Recent Labs: 09/17/2016: ALT 11; BUN 20; Creatinine, Ser 0.73;  Hemoglobin 17.4; Platelets 312; Potassium 3.2; Sodium 141   Lipid Panel    Component Value Date/Time   CHOL 186 08/10/2016 0807   TRIG 80 08/10/2016 0807   HDL 32 (L) 08/10/2016 0807   CHOLHDL 5.8 08/10/2016 0807   VLDL 16 08/10/2016 0807   LDLCALC 138 (H) 08/10/2016 0807    Additional studies/ records that were reviewed today include:   Cardiac Catheterization:09/09/2016  The left ventricular systolic function is normal.  LV end diastolic pressure is normal.  The left ventricular ejection fraction is 55-65% by visual estimate.  Ost RCA to Mid RCA lesion, 40 %stenosed.  Acute Mrg lesion, 80 %stenosed.  Lat 3rd Mrg lesion, 70 %stenosed.   1. No significant obstructive CAD. There is an 80% stenosis in a small RV marginal branch. There is systolic compression of a small distal branch of the OM. 2. Normal LV function 3. Normal LVEDP  Plan: recommend medical therapy and risk factor modification.  Assessment:    1. Coronary artery disease involving native heart with angina pectoris, unspecified vessel or lesion type (HCC)   2. Mixed hyperlipidemia   3. Tobacco use   4. Medication management     Plan:   In order of problems listed  above:  1. CAD with stable angina - seen in the office in 07/2016 reporting worsening episodes of chest discomfort with exertion despite a recent normal NST. A cardiac catheterization was ordered for definitive evaluation which showed 40% Ost RCA stenosis, 70% Lateral 3rd Mrg, and 80% Acute Mrg stenosis with the Mrg branch being too small for PCI, therefore medical therapy and risk factor modification were recommended.  - she reports continuing to have mild episodes of chest discomfort several times per week with exertion, relieved with SL NTG. - will start low-dose Imdur 30mg  daily to assist with her symptoms secondary to small vessel disease. Initiate statin therapy as below. Smoking cessation advised.   2. Mixed Hyperlipidemia - Lipid Panel in 07/2016 showed total cholesterol 186, HDL 32, and LDL 138. LFT's within normal limits at that time. - with known CAD, goal LDL < 70. Will start Atorvastatin 20mg  daily. She Will obtain a repeat Lipid Panel and LFT's in 6-8 weeks at her PCP's office (lives in [place], [place]).   3. Tobacco Use - reviewed the connection between tobacco use and CAD with the patient. - cessation advised.    Medication Adjustments/Labs and Tests Ordered: Current medicines are reviewed at length with the patient today.  Concerns regarding medicines are outlined above.  Medication changes, Labs and Tests ordered today are listed in the Patient Instructions below.  Patient Instructions  Medication Instructions:  Start imdur 30mg  daily Start lipitor 20mg  at night   If you need a refill on your cardiac medications before your next appointment, please call your pharmacy.  Labwork: LIPID AND CMET IN 6 WEEKS AT PRIMARY CARE    Signed, Ellsworth LennoxBrittany M Hyla Coard, [place]  09/24/2016 2:25 PM    [facility] [address], [address] [place], [place]  [address] Phone: [phone]; Fax: [phone]  [address], [address] [place], [place] [address] Phone:  [phone]

## 2016-09-24 ENCOUNTER — Encounter: Payer: Self-pay | Admitting: Student

## 2016-09-24 ENCOUNTER — Ambulatory Visit (INDEPENDENT_AMBULATORY_CARE_PROVIDER_SITE_OTHER): Payer: BLUE CROSS/BLUE SHIELD | Admitting: Student

## 2016-09-24 VITALS — BP 117/76 | HR 86 | Ht 63.0 in | Wt 119.0 lb

## 2016-09-24 DIAGNOSIS — Z720 Tobacco use: Secondary | ICD-10-CM | POA: Diagnosis not present

## 2016-09-24 DIAGNOSIS — Z79899 Other long term (current) drug therapy: Secondary | ICD-10-CM | POA: Diagnosis not present

## 2016-09-24 DIAGNOSIS — I2510 Atherosclerotic heart disease of native coronary artery without angina pectoris: Secondary | ICD-10-CM | POA: Insufficient documentation

## 2016-09-24 DIAGNOSIS — E782 Mixed hyperlipidemia: Secondary | ICD-10-CM

## 2016-09-24 DIAGNOSIS — I25119 Atherosclerotic heart disease of native coronary artery with unspecified angina pectoris: Secondary | ICD-10-CM | POA: Diagnosis not present

## 2016-09-24 MED ORDER — ISOSORBIDE MONONITRATE ER 30 MG PO TB24: 30.0000 mg | ORAL_TABLET | Freq: Every day | ORAL | 5 refills | Status: DC

## 2016-09-24 MED ORDER — ATORVASTATIN CALCIUM 20 MG PO TABS: 20.0000 mg | ORAL_TABLET | Freq: Every day | ORAL | 5 refills | Status: DC

## 2016-09-24 NOTE — Patient Instructions (Signed)
Medication Instructions:  Start imdur 30mg  daily Start lipitor 20mg  at night   If you need a refill on your cardiac medications before your next appointment, please call your pharmacy.  Labwork: LIPID AND CMET IN 6 WEEKS AT PRIMARY CARE  Follow-Up: Your physician wants you to follow-up in: 12 MONTHS [facility] You will receive a reminder letter in the mail two months in advance. If you don't receive a letter, please call our office (IN November 2018) to schedule the follow-up appointment. :     Thank you for choosing [facility]!!    Veretta Sabourin, LPN [facility]AD[facility] PA-C

## 2016-10-01 DIAGNOSIS — E782 Mixed hyperlipidemia: Secondary | ICD-10-CM | POA: Diagnosis not present

## 2016-10-01 DIAGNOSIS — Z6820 Body mass index (BMI) 20.0-20.9, adult: Secondary | ICD-10-CM | POA: Diagnosis not present

## 2016-10-01 DIAGNOSIS — Z1389 Encounter for screening for other disorder: Secondary | ICD-10-CM | POA: Diagnosis not present

## 2016-10-01 DIAGNOSIS — M47812 Spondylosis without myelopathy or radiculopathy, cervical region: Secondary | ICD-10-CM | POA: Diagnosis not present

## 2016-10-01 DIAGNOSIS — G894 Chronic pain syndrome: Secondary | ICD-10-CM | POA: Diagnosis not present

## 2016-11-12 DIAGNOSIS — E782 Mixed hyperlipidemia: Secondary | ICD-10-CM | POA: Diagnosis not present

## 2016-11-12 DIAGNOSIS — Z681 Body mass index (BMI) 19 or less, adult: Secondary | ICD-10-CM | POA: Diagnosis not present

## 2016-11-12 DIAGNOSIS — B373 Candidiasis of vulva and vagina: Secondary | ICD-10-CM | POA: Diagnosis not present

## 2016-11-12 DIAGNOSIS — R300 Dysuria: Secondary | ICD-10-CM | POA: Diagnosis not present

## 2016-11-12 DIAGNOSIS — I2510 Atherosclerotic heart disease of native coronary artery without angina pectoris: Secondary | ICD-10-CM | POA: Diagnosis not present

## 2016-11-12 DIAGNOSIS — Z1389 Encounter for screening for other disorder: Secondary | ICD-10-CM | POA: Diagnosis not present

## 2016-12-01 DIAGNOSIS — F1120 Opioid dependence, uncomplicated: Secondary | ICD-10-CM | POA: Diagnosis not present

## 2016-12-01 DIAGNOSIS — G894 Chronic pain syndrome: Secondary | ICD-10-CM | POA: Diagnosis not present

## 2016-12-01 DIAGNOSIS — Z681 Body mass index (BMI) 19 or less, adult: Secondary | ICD-10-CM | POA: Diagnosis not present

## 2016-12-01 DIAGNOSIS — Z1389 Encounter for screening for other disorder: Secondary | ICD-10-CM | POA: Diagnosis not present

## 2016-12-01 DIAGNOSIS — K047 Periapical abscess without sinus: Secondary | ICD-10-CM | POA: Diagnosis not present

## 2017-02-04 DIAGNOSIS — Z1389 Encounter for screening for other disorder: Secondary | ICD-10-CM | POA: Diagnosis not present

## 2017-02-04 DIAGNOSIS — Z681 Body mass index (BMI) 19 or less, adult: Secondary | ICD-10-CM | POA: Diagnosis not present

## 2017-02-04 DIAGNOSIS — F419 Anxiety disorder, unspecified: Secondary | ICD-10-CM | POA: Diagnosis not present

## 2017-02-04 DIAGNOSIS — M5417 Radiculopathy, lumbosacral region: Secondary | ICD-10-CM | POA: Diagnosis not present

## 2017-02-04 DIAGNOSIS — G894 Chronic pain syndrome: Secondary | ICD-10-CM | POA: Diagnosis not present

## 2017-02-04 DIAGNOSIS — E748 Other specified disorders of carbohydrate metabolism: Secondary | ICD-10-CM | POA: Diagnosis not present

## 2017-03-19 DIAGNOSIS — Z1389 Encounter for screening for other disorder: Secondary | ICD-10-CM | POA: Diagnosis not present

## 2017-03-19 DIAGNOSIS — G894 Chronic pain syndrome: Secondary | ICD-10-CM | POA: Diagnosis not present

## 2017-03-19 DIAGNOSIS — Z681 Body mass index (BMI) 19 or less, adult: Secondary | ICD-10-CM | POA: Diagnosis not present

## 2017-03-19 DIAGNOSIS — E782 Mixed hyperlipidemia: Secondary | ICD-10-CM | POA: Diagnosis not present

## 2017-04-12 ENCOUNTER — Other Ambulatory Visit: Payer: Self-pay | Admitting: Student

## 2017-04-13 ENCOUNTER — Other Ambulatory Visit: Payer: Self-pay | Admitting: Student

## 2017-05-18 DIAGNOSIS — Z681 Body mass index (BMI) 19 or less, adult: Secondary | ICD-10-CM | POA: Diagnosis not present

## 2017-05-18 DIAGNOSIS — G894 Chronic pain syndrome: Secondary | ICD-10-CM | POA: Diagnosis not present

## 2017-05-18 DIAGNOSIS — F419 Anxiety disorder, unspecified: Secondary | ICD-10-CM | POA: Diagnosis not present

## 2017-05-18 DIAGNOSIS — E669 Obesity, unspecified: Secondary | ICD-10-CM | POA: Diagnosis not present

## 2017-05-18 DIAGNOSIS — Z1389 Encounter for screening for other disorder: Secondary | ICD-10-CM | POA: Diagnosis not present

## 2017-06-15 ENCOUNTER — Emergency Department (HOSPITAL_COMMUNITY)
Admission: EM | Admit: 2017-06-15 | Discharge: 2017-06-15 | Disposition: A | Discharge status: Home Health | Payer: BLUE CROSS/BLUE SHIELD | Attending: Emergency Medicine | Admitting: Emergency Medicine

## 2017-06-15 ENCOUNTER — Emergency Department (HOSPITAL_COMMUNITY): Payer: BLUE CROSS/BLUE SHIELD

## 2017-06-15 ENCOUNTER — Encounter (HOSPITAL_COMMUNITY): Payer: Self-pay | Admitting: Emergency Medicine

## 2017-06-15 DIAGNOSIS — I2510 Atherosclerotic heart disease of native coronary artery without angina pectoris: Secondary | ICD-10-CM | POA: Insufficient documentation

## 2017-06-15 DIAGNOSIS — M25569 Pain in unspecified knee: Secondary | ICD-10-CM

## 2017-06-15 DIAGNOSIS — S8992XA Unspecified injury of left lower leg, initial encounter: Secondary | ICD-10-CM | POA: Diagnosis not present

## 2017-06-15 DIAGNOSIS — Z79899 Other long term (current) drug therapy: Secondary | ICD-10-CM | POA: Insufficient documentation

## 2017-06-15 DIAGNOSIS — M25562 Pain in left knee: Secondary | ICD-10-CM | POA: Insufficient documentation

## 2017-06-15 DIAGNOSIS — F17210 Nicotine dependence, cigarettes, uncomplicated: Secondary | ICD-10-CM | POA: Diagnosis not present

## 2017-06-15 MED ORDER — IBUPROFEN 400 MG PO TABS: 400.0000 mg | ORAL_TABLET | Freq: Three times a day (TID) | ORAL | 0 refills | Status: AC

## 2017-06-15 MED ORDER — IBUPROFEN 400 MG PO TABS: 400.0000 mg | ORAL_TABLET | Freq: Four times a day (QID) | ORAL | 0 refills | Status: DC | PRN

## 2017-06-15 NOTE — ED Triage Notes (Signed)
Pt states that she was picking her dog up last night and came down on  the [place]spring and felt something pop.  Pt states that she took and oxycodone 30 mg and is still in pain.  Pt states that it is hurting from her "butt cheek down to her toes".

## 2017-06-15 NOTE — ED Provider Notes (Signed)
[facility] EMERGENCY DEPARTMENT Provider Note   CSN: [id number] Arrival date & time: 06/15/17  4098     History   Chief Complaint Chief Complaint  Patient presents with  . Knee Pain    left    HPI Katelyn Smith is a 51 y.o. female.  Patient had her knee flexedand her patellar tendon was resting on the corner of the box spring and she leaned over a bed to pick up her dog and when she did this she felt some type of pop and in-stent excruciating pain to the area that was touching the box spring. This made her fall to the floor and cry. She did walking since then and the pain is slightly improved since the initial pain was still severe in nature. She tried taking her home oxycodone 30 mg without relief.    Knee Pain   This is a new problem. The current episode started 6 to 12 hours ago. The problem occurs constantly. The problem has not changed since onset.The pain is present in the left knee. The quality of the pain is described as aching. The pain is mild. Pertinent negatives include no numbness. She has tried nothing for the symptoms.    Past Medical History:  Diagnosis Date  . Anxiety   . Chest pain    a. 07/2016: Stress test showing no evidence of ischemia. b. cath 08/2016: 40% Ost RCA stenosis, 70% Lateral 3rd Mrg, and 80% Acute Mrg (too small for intervention, therefore medical therapy recommended).   . Chronic neck pain   . Depression   . H/O degenerative disc disease   . High cholesterol   . Tobacco abuse     Patient Active Problem List   Diagnosis Date Noted  . CAD (coronary artery disease) 09/24/2016  . Unstable angina (HCC)   . Chest pain 08/10/2016  . Hyperlipidemia 08/10/2016  . Chronic pain syndrome 08/10/2016  . Tobacco abuse 08/10/2016  . Hypokalemia 08/10/2016  . Urge incontinence 12/15/2012  . Chronic low back pain 12/15/2012    Past Surgical History:  Procedure Laterality Date  . BACK SURGERY    . CARDIAC CATHETERIZATION N/A 09/09/2016   Procedure: Left Heart Cath and Coronary Angiography;  Surgeon: Peter M Swaziland, MD;  Location: [facility] INVASIVE CV LAB;  Service: Cardiovascular;  Laterality: N/A;  . CARDIAC CATHETERIZATION  09/09/2016  . TUBAL LIGATION      OB History    Gravida Para Term Preterm AB Living   SAB TAB Ectopic Multiple Live Births                   Home Medications    Prior to Admission medications   Medication Sig Start Date End Date Taking? Authorizing Provider  ALPRAZolam Prudy Feeler) 1 MG tablet Take 1 mg by mouth daily as needed for anxiety or sleep.     [provider]  aspirin EC 81 MG EC tablet Take 1 tablet (81 mg total) by mouth daily. 08/11/16   Joseph Art, DO  atorvastatin (LIPITOR) 20 MG tablet TAKE 1 TABLET BY MOUTH EVERY DAY 04/13/17   [place] Ouch, [place] M, PA-C  ibuprofen (ADVIL,MOTRIN) 400 MG tablet Take 1 tablet (400 mg total) by mouth 3 (three) times daily. 06/15/17 06/22/17  Adilynne Fitzwater, Barbara Cower, MD  isosorbide mononitrate (IMDUR) 30 MG 24 hr tablet TAKE 1 TABLET BY MOUTH DAILY 04/13/17   Iran Ouch, [place] M, PA-C  mirtazapine (REMERON) 30 MG tablet  Take 30 mg by mouth at bedtime.    [provider]  nitroGLYCERIN (NITROSTAT) 0.4 MG SL tablet PLACE AND DISSOLVE 1 TABLET UNDER THE TONGUE EVERY 5 MINUTES AS NEEDED FOR CHEST PAIN. MAY TAKE UP TO 3 DOSES. IF NO RELIEF CALL 911 04/13/17   Strader, [place] M, PA-C  nitroGLYCERIN (NITROSTAT) 0.4 MG SL tablet PLACE AND DISSOLVE 1 TABLET UNDER THE TONGUE EVERY 5 MINUTES AS NEEDED FOR CHEST PAIN. MAY TAKE UP TO 3 DOSES. IF NO RELIEF CALL 911 04/13/17   Iran Ouch, Lennart Pall, PA-C  ondansetron (ZOFRAN) 4 mg TABS tablet Take 4 tablets by mouth every 8 (eight) hours as needed. 09/17/16   Mancel Bale, MD  Oxycodone HCl 20 MG TABS Take 20 mg by mouth every 4 (four) hours as needed (for pain).    [provider]  Probiotic Product (PROBIOTIC DAILY PO) Take 1 tablet by mouth every evening.    [provider]  rOPINIRole  (REQUIP) 1 MG tablet Take 1 mg by mouth at bedtime.    [provider]    Family History Family History  Problem Relation Age of Onset  . Heart failure Mother   . Heart disease Mother 3       first MI early 4s  . Cancer Father        Died at 23  . Cancer Sister   . Stroke Other   . Heart disease Other     Social History Social History  Substance Use Topics  . Smoking status: Current Every Day Smoker    Packs/day: 0.50    Years: 30.00    Types: Cigarettes  . Smokeless tobacco: Never Used     Comment: Since age 4  . Alcohol use No     Allergies   Mirabegron   Review of Systems Review of Systems  Neurological: Negative for numbness.  All other systems reviewed and are negative.    Physical Exam Updated Vital Signs BP 137/86 (BP Location: Right Arm)   Pulse 75   Temp 98.2 F (36.8 C) (Oral)   Resp 18   Ht  (1.6 m)   Wt 56.7 kg (125 lb)   SpO2 97%   BMI 22.14 kg/m   Physical Exam  Constitutional: She appears well-developed and well-nourished.  HENT:  Head: Normocephalic and atraumatic.  Eyes: Conjunctivae and EOM are normal.  Neck: Normal range of motion.  Cardiovascular: Normal rate and regular rhythm.   Pulmonary/Chest: No stridor. No respiratory distress.  Abdominal: Bowel sounds are normal. She exhibits no distension.  Musculoskeletal: Normal range of motion. She exhibits edema. She exhibits no tenderness or deformity.  Mild left knee effusion. ttp over patellar tendon but normal straight leg raise. No obvious ecchymosis.  Neurological: She is alert.  Skin: Skin is warm and dry.  Nursing note and vitals reviewed.    ED Treatments / Results  Labs (all labs ordered are listed, but only abnormal results are displayed) Labs Reviewed - No data to display  EKG  EKG Interpretation None       Radiology Dg Knee 4 Views W/patella Left  Result Date: 06/15/2017 CLINICAL DATA:  Injury.  Pain. EXAM: LEFT KNEE - COMPLETE 4+ VIEW  COMPARISON:  No recent prior . FINDINGS: Diffuse osteopenia. No acute bony abnormality identified. No evidence of fracture or dislocation. No evidence of effusion. Peripheral vascular calcification. IMPRESSION: 1.  No acute bony or joint abnormality.  Diffuse osteopenia. 2. Peripheral vascular disease . Electronically Signed  ByMaisie Fus  Register   On: 06/15/2017 08:58    Procedures Procedures (including critical care time)  Medications Ordered in ED Medications - No data to display   Initial Impression / Assessment and Plan / ED Course  I have reviewed the triage vital signs and the nursing notes.  Pertinent labs & imaging results that were available during my care of the patient were reviewed by me and considered in my medical decision making (see chart for details).  Low suspicion for spontaneous reduction of a posterior knee dislocation. No evidence of persistent patellar dislocation. No evidence of severe patellar tendon injury. Possibly just contusion? Hemarthrosis? Avulsion fracture? Will xr. Patient adamant about not wanting pain meds currently.   XR ok.  Will suggest RICE with crutches PRN for pain. PCP follow up in a week if not improving.   Final Clinical Impressions(s) / ED Diagnoses   Final diagnoses:  Knee pain    New Prescriptions Current Discharge Medication List    START taking these medications   Details  ibuprofen (ADVIL,MOTRIN) 400 MG tablet Take 1 tablet (400 mg total) by mouth 3 (three) times daily. Qty: 30 tablet, Refills: 0         Hrishikesh Hoeg, Barbara Cower, MD 06/15/17 [phone]

## 2017-07-13 DIAGNOSIS — F419 Anxiety disorder, unspecified: Secondary | ICD-10-CM | POA: Diagnosis not present

## 2017-07-13 DIAGNOSIS — F1120 Opioid dependence, uncomplicated: Secondary | ICD-10-CM | POA: Diagnosis not present

## 2017-07-13 DIAGNOSIS — Z23 Encounter for immunization: Secondary | ICD-10-CM | POA: Diagnosis not present

## 2017-07-13 DIAGNOSIS — G894 Chronic pain syndrome: Secondary | ICD-10-CM | POA: Diagnosis not present

## 2017-07-13 DIAGNOSIS — Z1389 Encounter for screening for other disorder: Secondary | ICD-10-CM | POA: Diagnosis not present

## 2017-07-13 DIAGNOSIS — Z681 Body mass index (BMI) 19 or less, adult: Secondary | ICD-10-CM | POA: Diagnosis not present

## 2017-08-06 DIAGNOSIS — Z1231 Encounter for screening mammogram for malignant neoplasm of breast: Secondary | ICD-10-CM | POA: Diagnosis not present

## 2017-08-25 ENCOUNTER — Other Ambulatory Visit: Payer: Self-pay | Admitting: Student

## 2017-09-14 DIAGNOSIS — F1120 Opioid dependence, uncomplicated: Secondary | ICD-10-CM | POA: Diagnosis not present

## 2017-09-14 DIAGNOSIS — Z681 Body mass index (BMI) 19 or less, adult: Secondary | ICD-10-CM | POA: Diagnosis not present

## 2017-09-14 DIAGNOSIS — G894 Chronic pain syndrome: Secondary | ICD-10-CM | POA: Diagnosis not present

## 2017-09-14 DIAGNOSIS — F330 Major depressive disorder, recurrent, mild: Secondary | ICD-10-CM | POA: Diagnosis not present

## 2017-09-14 DIAGNOSIS — J329 Chronic sinusitis, unspecified: Secondary | ICD-10-CM | POA: Diagnosis not present

## 2017-09-14 DIAGNOSIS — I10 Essential (primary) hypertension: Secondary | ICD-10-CM | POA: Diagnosis not present

## 2017-09-14 DIAGNOSIS — F329 Major depressive disorder, single episode, unspecified: Secondary | ICD-10-CM | POA: Diagnosis not present

## 2017-09-14 DIAGNOSIS — Z1389 Encounter for screening for other disorder: Secondary | ICD-10-CM | POA: Diagnosis not present

## 2017-09-18 ENCOUNTER — Other Ambulatory Visit: Payer: Self-pay | Admitting: Student

## 2017-09-21 DIAGNOSIS — Z1211 Encounter for screening for malignant neoplasm of colon: Secondary | ICD-10-CM | POA: Diagnosis not present

## 2017-09-22 DIAGNOSIS — R3129 Other microscopic hematuria: Secondary | ICD-10-CM | POA: Diagnosis not present

## 2017-09-22 DIAGNOSIS — Z681 Body mass index (BMI) 19 or less, adult: Secondary | ICD-10-CM | POA: Diagnosis not present

## 2017-09-22 DIAGNOSIS — N3010 Interstitial cystitis (chronic) without hematuria: Secondary | ICD-10-CM | POA: Diagnosis not present

## 2017-09-22 DIAGNOSIS — G894 Chronic pain syndrome: Secondary | ICD-10-CM | POA: Diagnosis not present

## 2017-09-22 DIAGNOSIS — R300 Dysuria: Secondary | ICD-10-CM | POA: Diagnosis not present

## 2017-09-22 DIAGNOSIS — Z1389 Encounter for screening for other disorder: Secondary | ICD-10-CM | POA: Diagnosis not present

## 2017-11-21 ENCOUNTER — Other Ambulatory Visit: Payer: Self-pay | Admitting: Student

## 2017-11-23 ENCOUNTER — Other Ambulatory Visit: Payer: Self-pay | Admitting: Student

## 2017-12-03 ENCOUNTER — Encounter (HOSPITAL_COMMUNITY): Payer: Self-pay | Admitting: Emergency Medicine

## 2017-12-03 ENCOUNTER — Other Ambulatory Visit: Payer: Self-pay

## 2017-12-03 ENCOUNTER — Emergency Department (HOSPITAL_COMMUNITY)
Admission: EM | Admit: 2017-12-03 | Discharge: 2017-12-03 | Disposition: A | Discharge status: Home / Self Care | Payer: BLUE CROSS/BLUE SHIELD | Attending: Emergency Medicine | Admitting: Emergency Medicine

## 2017-12-03 DIAGNOSIS — G5711 Meralgia paresthetica, right lower limb: Secondary | ICD-10-CM | POA: Insufficient documentation

## 2017-12-03 DIAGNOSIS — F17210 Nicotine dependence, cigarettes, uncomplicated: Secondary | ICD-10-CM | POA: Diagnosis not present

## 2017-12-03 DIAGNOSIS — Z79899 Other long term (current) drug therapy: Secondary | ICD-10-CM | POA: Diagnosis not present

## 2017-12-03 DIAGNOSIS — Z7982 Long term (current) use of aspirin: Secondary | ICD-10-CM | POA: Diagnosis not present

## 2017-12-03 DIAGNOSIS — I259 Chronic ischemic heart disease, unspecified: Secondary | ICD-10-CM | POA: Insufficient documentation

## 2017-12-03 DIAGNOSIS — R200 Anesthesia of skin: Secondary | ICD-10-CM | POA: Diagnosis not present

## 2017-12-03 HISTORY — DX: Radiculopathy, lumbar region: M54.16

## 2017-12-03 HISTORY — DX: Other chronic pain: G89.29

## 2017-12-03 HISTORY — DX: Dorsalgia, unspecified: M54.9

## 2017-12-03 NOTE — Discharge Instructions (Signed)
I suspect you have a condition called meralgia paresthetica.  This is caused by compression of a nerve in your thigh called the lateral femoral cutaneous nerve.  The symptoms you are describing and the location is consistent with this.  This is not a serious condition but the symptoms can be aggravating.  Generally we recommend wearing loosefitting clothing and taking nonsteroidal anti-inflammatory medicines like ibuprofen as needed.  Be aware that symptoms can often last from weeks to months though.  I do not think that this is an acute issue with your back.  Her symptoms are consistent with a stroke.  This condition should not give you any muscle weakness.  If you experience numbness outside of the area you are describing currently, have muscle weakness or difficulty controlling your bladder or bowels then you need reevaluated.

## 2017-12-03 NOTE — ED Triage Notes (Signed)
Pt c/o RT upper leg numbness x 4 days. Pt ambulatory. Also reports chronic back pain.

## 2017-12-07 NOTE — ED Provider Notes (Signed)
[facility] EMERGENCY DEPARTMENT Provider Note   CSN: [id number] Arrival date & time: 12/03/17  1021     History   Chief Complaint Chief Complaint  Patient presents with  . Numbness    HPI Katelyn Smith is a 52 y.o. female.  HPI   52 year old female with right lower extremity numbness.  She has noticed numbness in her right proximal anterior thigh.  First noticed about 4 days ago.  Is been persistent since then.  Seems worse at times but has not noticed anything that seems to necessarily exacerbated.  She has no other acute neurologic complaints.  She denies any incontinence or weakness.  She has some lower back pain which is chronic in nature.  She denies any acute change in this.  Past Medical History:  Diagnosis Date  . Anxiety   . Chest pain    a. 07/2016: Stress test showing no evidence of ischemia. b. cath 08/2016: 40% Ost RCA stenosis, 70% Lateral 3rd Mrg, and 80% Acute Mrg (too small for intervention, therefore medical therapy recommended).   . Chronic back pain   . Chronic neck pain   . Depression   . H/O degenerative disc disease   . High cholesterol   . Lumbar radiculopathy, right   . Tobacco abuse     Patient Active Problem List   Diagnosis Date Noted  . CAD (coronary artery disease) 09/24/2016  . Unstable angina (HCC)   . Chest pain 08/10/2016  . Hyperlipidemia 08/10/2016  . Chronic pain syndrome 08/10/2016  . Tobacco abuse 08/10/2016  . Hypokalemia 08/10/2016  . Urge incontinence 12/15/2012  . Chronic low back pain 12/15/2012    Past Surgical History:  Procedure Laterality Date  . BACK SURGERY    . CARDIAC CATHETERIZATION N/A 09/09/2016   Procedure: Left Heart Cath and Coronary Angiography;  Surgeon: Peter M Swaziland, MD;  Location: [facility] INVASIVE CV LAB;  Service: Cardiovascular;  Laterality: N/A;  . CARDIAC CATHETERIZATION  09/09/2016  . TUBAL LIGATION       OB History    Gravida  3   Para  3   Term  3   Preterm      AB      Living  3       SAB      TAB      Ectopic      Multiple      Live Births               Home Medications    Prior to Admission medications   Medication Sig Start Date End Date Taking? Authorizing Provider  ALPRAZolam Prudy Feeler) 1 MG tablet Take 1 mg by mouth daily as needed for anxiety or sleep.    Yes [provider]  aspirin EC 81 MG EC tablet Take 1 tablet (81 mg total) by mouth daily. 08/11/16  Yes Marlin Canary U, DO  isosorbide mononitrate (IMDUR) 30 MG 24 hr tablet TAKE 1 TABLET BY MOUTH DAILY 11/22/17  Yes Strader, [place] M, PA-C  mirtazapine (REMERON) 30 MG tablet Take 30 mg by mouth at bedtime.   Yes [provider]  nitroGLYCERIN (NITROSTAT) 0.4 MG SL tablet PLACE AND DISSOLVE 1 TABLET UNDER THE TONGUE EVERY 5 MINUTES AS NEEDED FOR CHEST PAIN. MAY TAKE UP TO 3 DOSES. IF NO RELIEF CALL 911 04/13/17  Yes Strader, [place] M, PA-C  oxycodone (ROXICODONE) 30 MG immediate release tablet Take 20 mg by mouth every 4 (four) hours as needed (for pain).  Yes [provider]  Probiotic Product (PROBIOTIC DAILY PO) Take 1 tablet by mouth every evening.   Yes [provider]  rOPINIRole (REQUIP) 1 MG tablet Take 1 mg by mouth at bedtime.   Yes [provider]  atorvastatin (LIPITOR) 20 MG tablet TAKE 1 TABLET BY MOUTH EVERY DAY Patient not taking: Reported on 12/03/2017 04/13/17   Iran Ouch, Lennart Pall, PA-C  nitroGLYCERIN (NITROSTAT) 0.4 MG SL tablet PLACE 1 TABLET UNDER THE TONGUE EVERY 5 MINUTES AS NEEDED FOR CHEST PAIN. MAY TAKE UP TO 3 DOSES. IF NO RELIEF CALL 911 11/24/17   Iran Ouch, Lennart Pall, PA-C  ondansetron (ZOFRAN) 4 mg TABS tablet Take 4 tablets by mouth every 8 (eight) hours as needed. Patient not taking: Reported on 12/03/2017 09/17/16   Mancel Bale, MD    Family History Family History  Problem Relation Age of Onset  . Heart failure Mother   . Heart disease Mother 29       first MI early 110s  . Cancer Father        Died at 55  . Cancer  Sister   . Stroke Other   . Heart disease Other     Social History Social History   Tobacco Use  . Smoking status: Current Every Day Smoker    Packs/day: 0.50    Years: 30.00    Pack years: 15.00    Types: Cigarettes  . Smokeless tobacco: Never Used  . Tobacco comment: Since age 19  Substance Use Topics  . Alcohol use: No  . Drug use: No     Allergies   Mirabegron   Review of Systems Review of Systems  All systems reviewed and negative, other than as noted in HPI.  Physical Exam Updated Vital Signs BP 122/67   Pulse 66   Temp 98.2 F (36.8 C) (Oral)   Resp 16   Ht 5\' 3"  (1.6 m)   Wt 54 kg (119 lb)   SpO2 94%   BMI 21.08 kg/m   Physical Exam  Constitutional: She is oriented to person, place, and time. She appears well-developed and well-nourished. No distress.  HENT:  Head: Normocephalic and atraumatic.  Eyes: Conjunctivae are normal. Right eye exhibits no discharge. Left eye exhibits no discharge.  Neck: Neck supple.  Cardiovascular: Normal rate, regular rhythm and normal heart sounds. Exam reveals no gallop and no friction rub.  No murmur heard. Pulmonary/Chest: Effort normal and breath sounds normal. No respiratory distress.  Abdominal: Soft. She exhibits no distension. There is no tenderness.  Musculoskeletal: She exhibits no edema or tenderness.  Neurological: She is alert and oriented to person, place, and time. No cranial nerve deficit. She exhibits normal muscle tone. Coordination normal.  Altered sensorium to the right anterior proximal to somewhat lateral thigh.  Extends from just below the inguinal crease to about mid thigh.  No overlying skin changes.  Full range of motion with good strength at the hip and knee.  Skin: Skin is warm and dry.  Psychiatric: She has a normal mood and affect. Her behavior is normal. Thought content normal.  Nursing note and vitals reviewed.    ED Treatments / Results  Labs (all labs ordered are listed, but only  abnormal results are displayed) Labs Reviewed - No data to display  EKG None  Radiology No results found.  Procedures Procedures (including critical care time)  Medications Ordered in ED Medications - No data to display   Initial Impression / Assessment and Plan /  ED Course  I have reviewed the triage vital signs and the nursing notes.  Pertinent labs & imaging results that were available during my care of the patient were reviewed by me and considered in my medical decision making (see chart for details).     Symptoms and distribution consistent with neuralgia paresthetica.  No acute back pain.  Numbness is isolated proximal/anterior to the lateral thigh.  No motor symptoms or findings on exam.  Discussed typical course and return precautions.  Discussed the numbness should not extend beyond this area or she should have other neurological findings with it.  Looser fitting clothing.  Can try NSAIDs.  Outpatient follow-up otherwise.  Final Clinical Impressions(s) / ED Diagnoses   Final diagnoses:  Meralgia paraesthetica, right    ED Discharge Orders    None       Raeford RazorKohut, Burrel Legrand, MD 12/07/17 [phone]

## 2017-12-31 DIAGNOSIS — F419 Anxiety disorder, unspecified: Secondary | ICD-10-CM | POA: Diagnosis not present

## 2017-12-31 DIAGNOSIS — R201 Hypoesthesia of skin: Secondary | ICD-10-CM | POA: Diagnosis not present

## 2017-12-31 DIAGNOSIS — I200 Unstable angina: Secondary | ICD-10-CM | POA: Diagnosis not present

## 2017-12-31 DIAGNOSIS — Z681 Body mass index (BMI) 19 or less, adult: Secondary | ICD-10-CM | POA: Diagnosis not present

## 2017-12-31 DIAGNOSIS — M5417 Radiculopathy, lumbosacral region: Secondary | ICD-10-CM | POA: Diagnosis not present

## 2017-12-31 DIAGNOSIS — G894 Chronic pain syndrome: Secondary | ICD-10-CM | POA: Diagnosis not present

## 2017-12-31 DIAGNOSIS — F1120 Opioid dependence, uncomplicated: Secondary | ICD-10-CM | POA: Diagnosis not present

## 2017-12-31 DIAGNOSIS — Z1389 Encounter for screening for other disorder: Secondary | ICD-10-CM | POA: Diagnosis not present

## 2018-02-01 ENCOUNTER — Other Ambulatory Visit: Payer: Self-pay | Admitting: Student

## 2018-02-01 NOTE — Telephone Encounter (Signed)
Rx sent to pharmacy   

## 2018-02-21 IMAGING — DX DG KNEE COMPLETE 4+V*L*
5 series · 5 of 5 positions shown · non-contrast
Comparison: No recent prior .

CLINICAL DATA: Injury.  Pain.

EXAM:
LEFT KNEE - COMPLETE 4+ VIEW

[knee ap (1 of 3)]
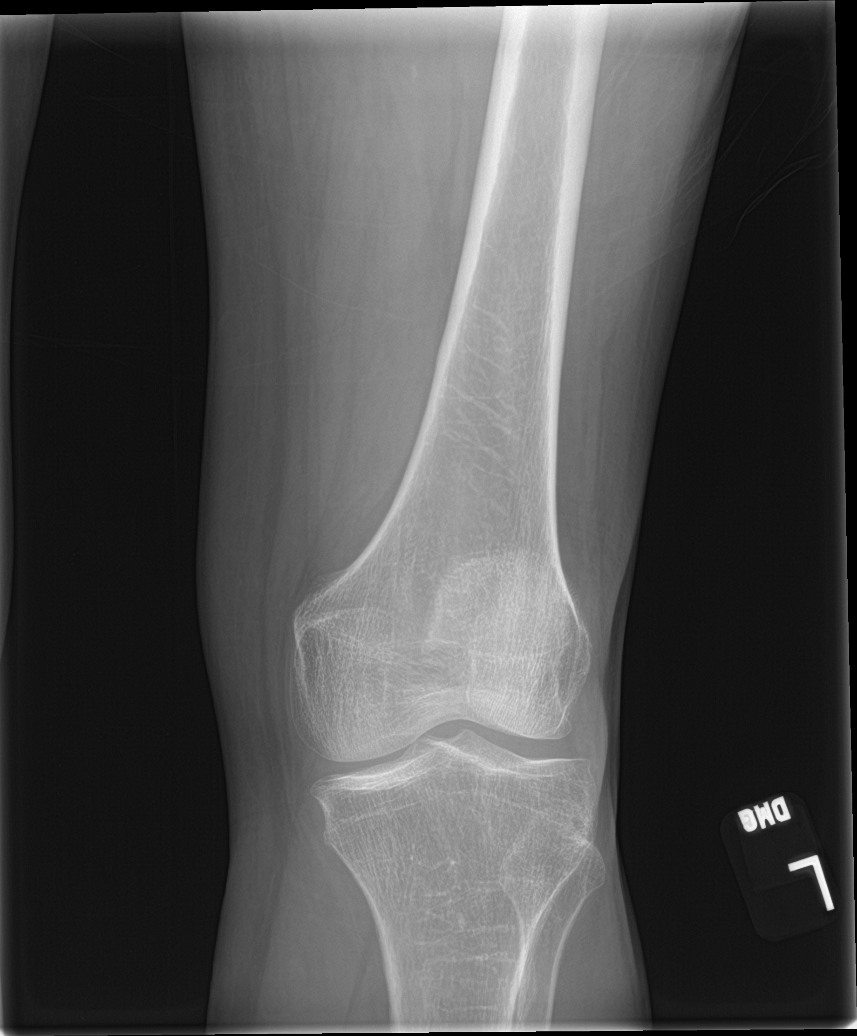

[knee ap (2 of 3)]
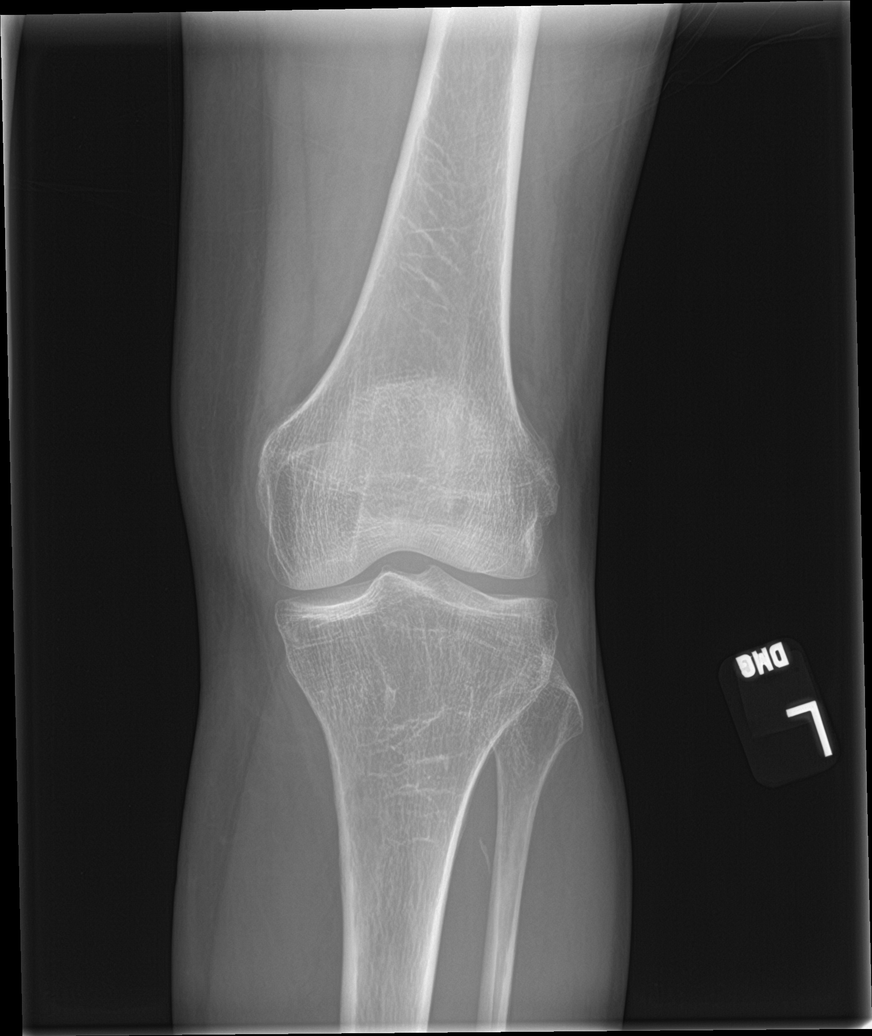

[knee ap (3 of 3)]
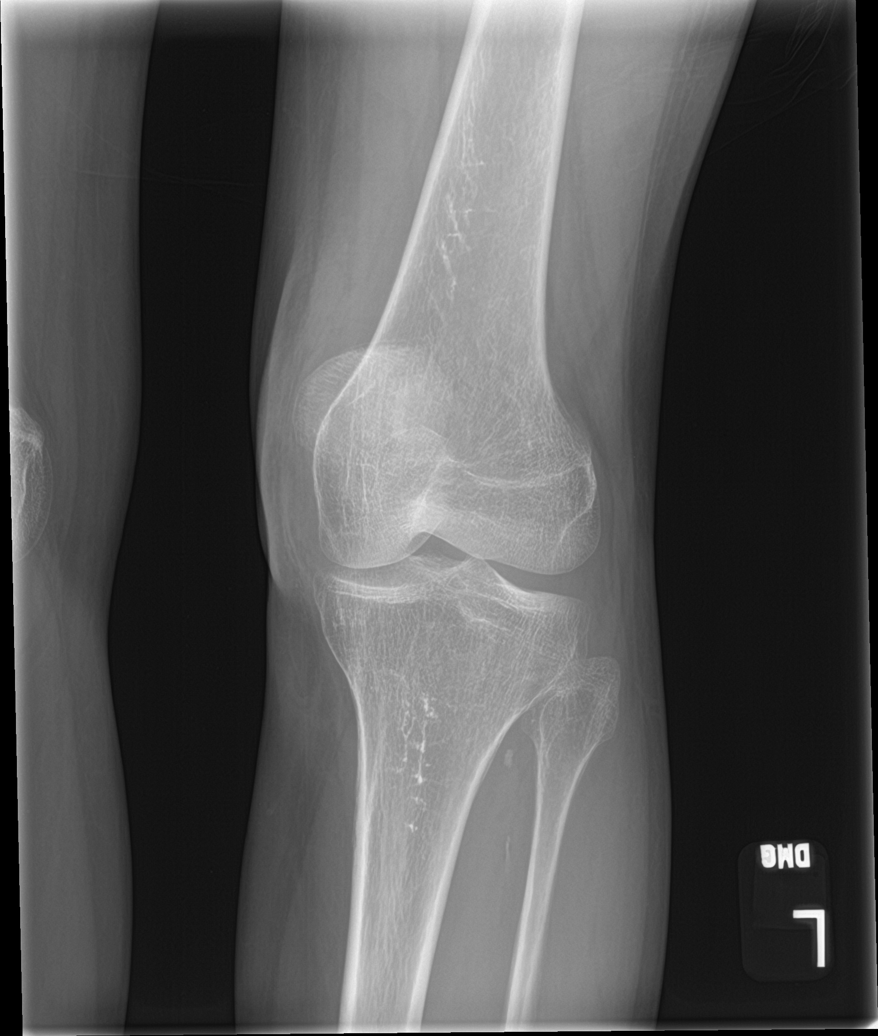

[knee lat]
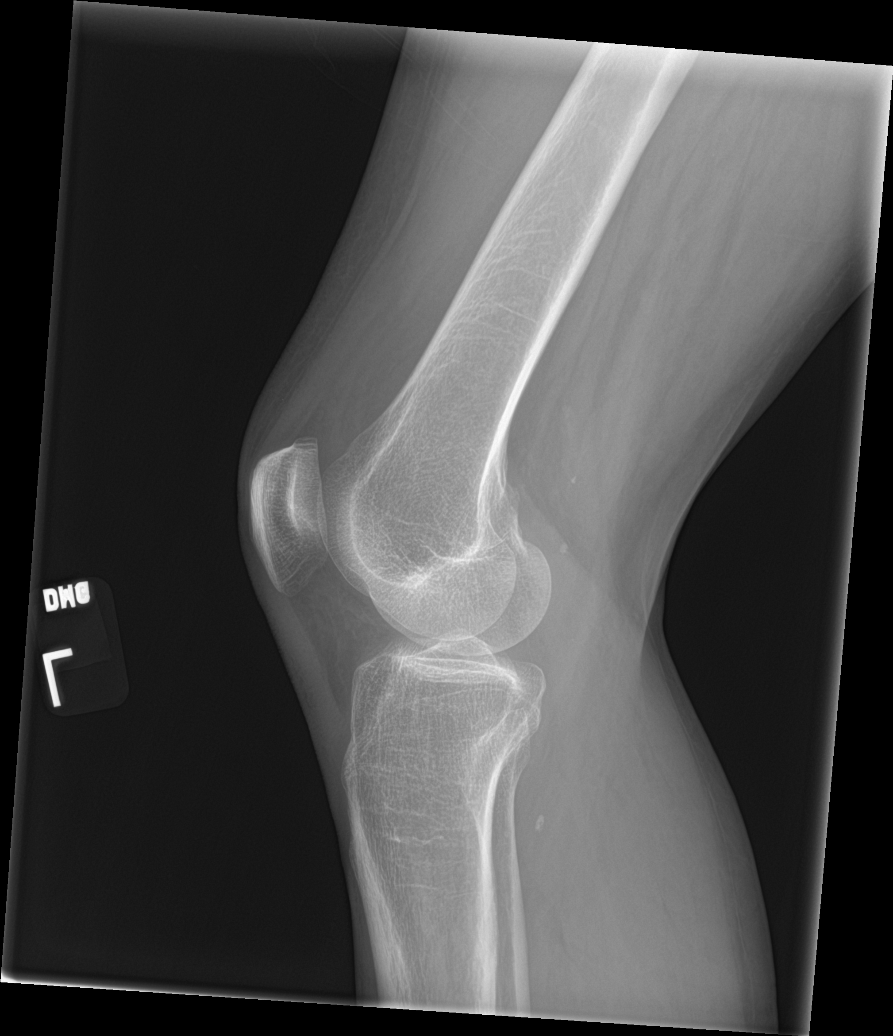

[knee sunrise]
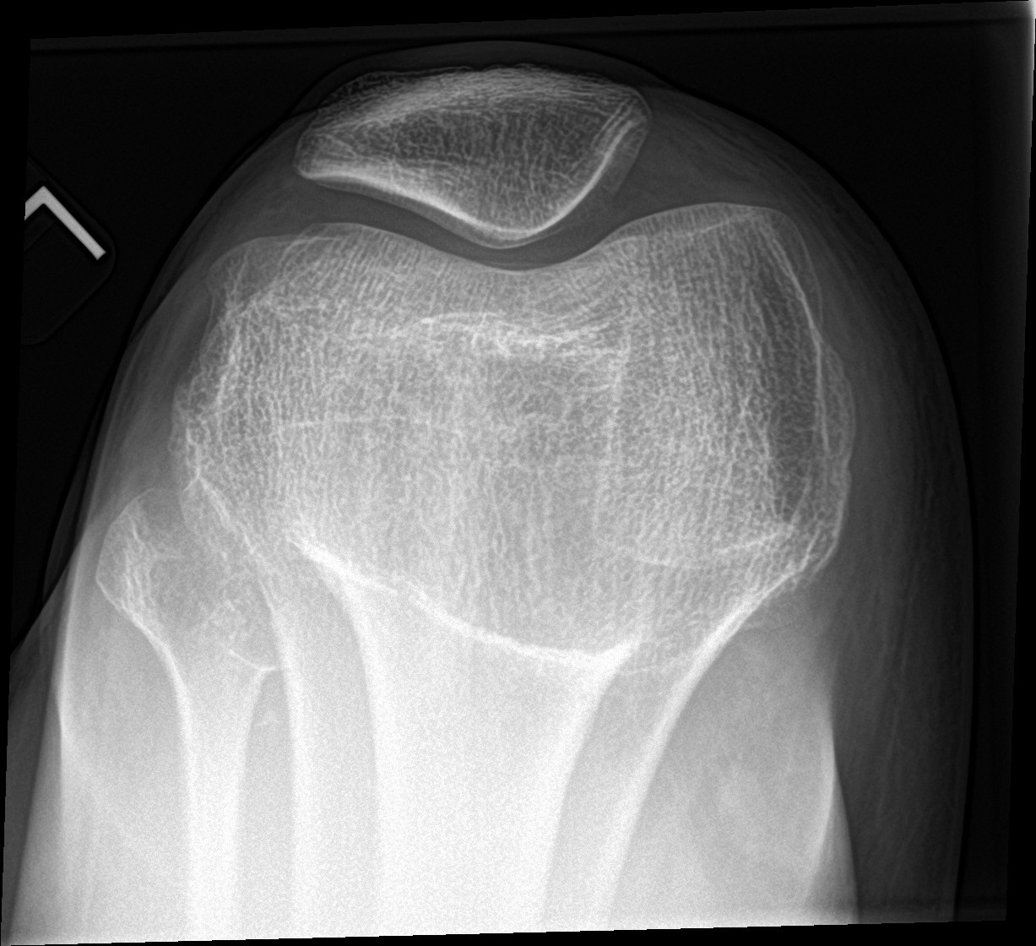

[5 of 5 positions shown; findings below may reference images not displayed]

FINDINGS: Diffuse osteopenia. No acute bony abnormality identified. No
evidence of fracture or dislocation. No evidence of effusion.
Peripheral vascular calcification.
IMPRESSION: 1.  No acute bony or joint abnormality.  Diffuse osteopenia.

2. Peripheral vascular disease .

## 2018-03-03 ENCOUNTER — Other Ambulatory Visit: Payer: Self-pay | Admitting: Cardiovascular Disease

## 2018-03-31 DIAGNOSIS — I200 Unstable angina: Secondary | ICD-10-CM | POA: Diagnosis not present

## 2018-03-31 DIAGNOSIS — Z1389 Encounter for screening for other disorder: Secondary | ICD-10-CM | POA: Diagnosis not present

## 2018-03-31 DIAGNOSIS — N3281 Overactive bladder: Secondary | ICD-10-CM | POA: Diagnosis not present

## 2018-03-31 DIAGNOSIS — Z719 Counseling, unspecified: Secondary | ICD-10-CM | POA: Diagnosis not present

## 2018-03-31 DIAGNOSIS — F17290 Nicotine dependence, other tobacco product, uncomplicated: Secondary | ICD-10-CM | POA: Diagnosis not present

## 2018-03-31 DIAGNOSIS — Z0000 Encounter for general adult medical examination without abnormal findings: Secondary | ICD-10-CM | POA: Diagnosis not present

## 2018-03-31 DIAGNOSIS — Z23 Encounter for immunization: Secondary | ICD-10-CM | POA: Diagnosis not present

## 2018-03-31 DIAGNOSIS — Z681 Body mass index (BMI) 19 or less, adult: Secondary | ICD-10-CM | POA: Diagnosis not present

## 2018-04-18 ENCOUNTER — Emergency Department (HOSPITAL_COMMUNITY)
Admission: EM | Admit: 2018-04-18 | Discharge: 2018-04-18 | Disposition: A | Discharge status: Home / Self Care | Payer: BLUE CROSS/BLUE SHIELD | Attending: Emergency Medicine | Admitting: Emergency Medicine

## 2018-04-18 ENCOUNTER — Encounter (HOSPITAL_COMMUNITY): Payer: Self-pay | Admitting: Emergency Medicine

## 2018-04-18 ENCOUNTER — Emergency Department (HOSPITAL_COMMUNITY): Payer: BLUE CROSS/BLUE SHIELD

## 2018-04-18 ENCOUNTER — Other Ambulatory Visit: Payer: Self-pay

## 2018-04-18 DIAGNOSIS — F17210 Nicotine dependence, cigarettes, uncomplicated: Secondary | ICD-10-CM | POA: Diagnosis not present

## 2018-04-18 DIAGNOSIS — Y939 Activity, unspecified: Secondary | ICD-10-CM | POA: Insufficient documentation

## 2018-04-18 DIAGNOSIS — Z7982 Long term (current) use of aspirin: Secondary | ICD-10-CM | POA: Insufficient documentation

## 2018-04-18 DIAGNOSIS — Y929 Unspecified place or not applicable: Secondary | ICD-10-CM | POA: Insufficient documentation

## 2018-04-18 DIAGNOSIS — S9001XA Contusion of right ankle, initial encounter: Secondary | ICD-10-CM | POA: Diagnosis not present

## 2018-04-18 DIAGNOSIS — M79671 Pain in right foot: Secondary | ICD-10-CM | POA: Diagnosis not present

## 2018-04-18 DIAGNOSIS — Z79899 Other long term (current) drug therapy: Secondary | ICD-10-CM | POA: Insufficient documentation

## 2018-04-18 DIAGNOSIS — Y999 Unspecified external cause status: Secondary | ICD-10-CM | POA: Diagnosis not present

## 2018-04-18 DIAGNOSIS — I25110 Atherosclerotic heart disease of native coronary artery with unstable angina pectoris: Secondary | ICD-10-CM | POA: Insufficient documentation

## 2018-04-18 DIAGNOSIS — W2209XA Striking against other stationary object, initial encounter: Secondary | ICD-10-CM | POA: Insufficient documentation

## 2018-04-18 DIAGNOSIS — S93401A Sprain of unspecified ligament of right ankle, initial encounter: Secondary | ICD-10-CM | POA: Insufficient documentation

## 2018-04-18 DIAGNOSIS — S9000XA Contusion of unspecified ankle, initial encounter: Secondary | ICD-10-CM

## 2018-04-18 DIAGNOSIS — S99921A Unspecified injury of right foot, initial encounter: Secondary | ICD-10-CM | POA: Diagnosis not present

## 2018-04-18 MED ORDER — IBUPROFEN 800 MG PO TABS
800.0000 mg | ORAL_TABLET | Freq: Once | ORAL | Status: AC
  Administered 2018-04-18: 800 mg via ORAL
  Filled 2018-04-18: qty 1

## 2018-04-18 NOTE — ED Provider Notes (Signed)
[facility] EMERGENCY DEPARTMENT Provider Note   CSN: [id number] Arrival date & time: 04/18/18  1039     History   Chief Complaint Chief Complaint  Patient presents with  . Foot Injury    HPI Taylin Mans Weiland is a 52 y.o. female.  Patient is a 52 year old female who presents to the emergency department with a complaint of right foot/ankle pain.  Patient states that on last night she hit her foot on a door.  She says the blow was mostly experienced on the inner aspect of her ankle.  She had some pain on last night, but the pain was worse this morning.  The pain in the ankle was accompanied by swelling.  The patient has pain when she attempts to walk on it so she came into the emergency department for evaluation.  No recent operations on the ankle.  The patient has had an operation on the foot.    The history is provided by the patient.  Foot Injury      Past Medical History:  Diagnosis Date  . Anxiety   . Chest pain    a. 07/2016: Stress test showing no evidence of ischemia. b. cath 08/2016: 40% Ost RCA stenosis, 70% Lateral 3rd Mrg, and 80% Acute Mrg (too small for intervention, therefore medical therapy recommended).   . Chronic back pain   . Chronic neck pain   . Depression   . H/O degenerative disc disease   . High cholesterol   . Lumbar radiculopathy, right   . Tobacco abuse     Patient Active Problem List   Diagnosis Date Noted  . CAD (coronary artery disease) 09/24/2016  . Unstable angina (HCC)   . Chest pain 08/10/2016  . Hyperlipidemia 08/10/2016  . Chronic pain syndrome 08/10/2016  . Tobacco abuse 08/10/2016  . Hypokalemia 08/10/2016  . Urge incontinence 12/15/2012  . Chronic low back pain 12/15/2012    Past Surgical History:  Procedure Laterality Date  . BACK SURGERY    . CARDIAC CATHETERIZATION N/A 09/09/2016   Procedure: Left Heart Cath and Coronary Angiography;  Surgeon: Peter M Swaziland, MD;  Location: [facility] INVASIVE CV LAB;  Service: Cardiovascular;   Laterality: N/A;  . CARDIAC CATHETERIZATION  09/09/2016  . TUBAL LIGATION       OB History    Gravida  3   Para  3   Term  3   Preterm      AB      Living  3     SAB      TAB      Ectopic      Multiple      Live Births               Home Medications    Prior to Admission medications   Medication Sig Start Date End Date Taking? Authorizing Provider  ALPRAZolam Prudy Feeler) 1 MG tablet Take 1 mg by mouth daily as needed for anxiety or sleep.     [provider]  aspirin EC 81 MG EC tablet Take 1 tablet (81 mg total) by mouth daily. 08/11/16   Joseph Art, DO  atorvastatin (LIPITOR) 20 MG tablet TAKE 1 TABLET BY MOUTH EVERY DAY Patient not taking: Reported on 12/03/2017 04/13/17   Iran Ouch, Lennart Pall, PA-C  isosorbide mononitrate (IMDUR) 30 MG 24 hr tablet TAKE 1 TABLET BY MOUTH DAILY 03/04/18   Chilton Si, MD  mirtazapine (REMERON) 30 MG tablet Take 30 mg by mouth at bedtime.  [provider]  nitroGLYCERIN (NITROSTAT) 0.4 MG SL tablet PLACE AND DISSOLVE 1 TABLET UNDER THE TONGUE EVERY 5 MINUTES AS NEEDED FOR CHEST PAIN. MAY TAKE UP TO 3 DOSES. IF NO RELIEF CALL 911 04/13/17   Iran OuchStrader, GrenadaBrittany M, PA-C  nitroGLYCERIN (NITROSTAT) 0.4 MG SL tablet PLACE 1 TABLET UNDER THE TONGUE EVERY 5 MINUTES AS NEEDED FOR CHEST PAIN. MAY TAKE UP TO 3 DOSES. IF NO RELIEF CALL 911 11/24/17   Iran OuchStrader, Lennart PallBrittany M, PA-C  ondansetron (ZOFRAN) 4 mg TABS tablet Take 4 tablets by mouth every 8 (eight) hours as needed. Patient not taking: Reported on 12/03/2017 09/17/16   Mancel BaleWentz, Elliott, MD  oxycodone (ROXICODONE) 30 MG immediate release tablet Take 20 mg by mouth every 4 (four) hours as needed (for pain).    [provider]  Probiotic Product (PROBIOTIC DAILY PO) Take 1 tablet by mouth every evening.    [provider]  rOPINIRole (REQUIP) 1 MG tablet Take 1 mg by mouth at bedtime.    [provider]    Family History Family History  Problem  Relation Age of Onset  . Heart failure Mother   . Heart disease Mother 7153       first MI early 9750s  . Cancer Father        Died at 2572  . Cancer Sister   . Stroke Other   . Heart disease Other     Social History Social History   Tobacco Use  . Smoking status: Current Every Day Smoker    Packs/day: 0.50    Years: 30.00    Pack years: 15.00    Types: Cigarettes  . Smokeless tobacco: Never Used  . Tobacco comment: Since age 52  Substance Use Topics  . Alcohol use: No  . Drug use: No     Allergies   Mirabegron   Review of Systems Review of Systems  Constitutional: Negative for activity change.       All ROS Neg except as noted in HPI  HENT: Negative for nosebleeds.   Eyes: Negative for photophobia and discharge.  Respiratory: Negative for cough, shortness of breath and wheezing.   Cardiovascular: Negative for chest pain and palpitations.  Gastrointestinal: Negative for abdominal pain and blood in stool.  Genitourinary: Negative for dysuria, frequency and hematuria.  Musculoskeletal: Positive for arthralgias. Negative for back pain and neck pain.  Skin: Negative.   Neurological: Negative for dizziness, seizures and speech difficulty.  Psychiatric/Behavioral: Negative for confusion and hallucinations.     Physical Exam Updated Vital Signs BP 113/68 (BP Location: Right Arm)   Pulse 72   Temp 98.2 F (36.8 C) (Oral)   Resp 16   Ht 5\' 2"  (1.575 m)   Wt 50.8 kg   SpO2 97%   BMI 20.49 kg/m   Physical Exam  Constitutional: She is oriented to person, place, and time. She appears well-developed and well-nourished.  Non-toxic appearance.  HENT:  Head: Normocephalic.  Right Ear: Tympanic membrane and external ear normal.  Left Ear: Tympanic membrane and external ear normal.  Eyes: Pupils are equal, round, and reactive to light. EOM and lids are normal.  Neck: Normal range of motion. Neck supple. Carotid bruit is not present.  Cardiovascular: Normal rate, regular  rhythm, normal heart sounds, intact distal pulses and normal pulses.  Pulmonary/Chest: Breath sounds normal. No respiratory distress.  Abdominal: Soft. Bowel sounds are normal. There is no tenderness. There is no guarding.  Musculoskeletal: Normal range of motion.  Right ankle: Tenderness. Medial malleolus tenderness found.       Feet:  There is a small abrasion to the medial aspect of the ankle.  The Achilles tendon on the right is intact.  The right dorsalis pedis and posterior tibial pulses are 2+.  Capillary refill is less than 2 seconds.  Lymphadenopathy:       Head (right side): No submandibular adenopathy present.       Head (left side): No submandibular adenopathy present.    She has no cervical adenopathy.  Neurological: She is alert and oriented to person, place, and time. She has normal strength. No cranial nerve deficit or sensory deficit.  Skin: Skin is warm and dry.  Psychiatric: She has a normal mood and affect. Her speech is normal.  Nursing note and vitals reviewed.    ED Treatments / Results  Labs (all labs ordered are listed, but only abnormal results are displayed) Labs Reviewed - No data to display  EKG None  Radiology Dg Foot Complete Right  Result Date: 04/18/2018 CLINICAL DATA:  Hit right foot on the door.  Pain. EXAM: RIGHT FOOT COMPLETE - 3+ VIEW COMPARISON:  None. FINDINGS: There is no evidence of fracture or dislocation. There is a single screw in the third metatarsal head without failure or complication. There is mild osteoarthritis of the first MTP joint. Solid arthrodesis of the second and third PIP joints. Soft tissues are unremarkable. IMPRESSION: No acute osseous injury of the right foot. Electronically Signed   By: Elige KoHetal  Patel   On: 04/18/2018 11:17    Procedures Procedures (including critical care time)  Medications Ordered in ED Medications  ibuprofen (ADVIL,MOTRIN) tablet 800 mg (has no administration in time range)     Initial  Impression / Assessment and Plan / ED Course  I have reviewed the triage vital signs and the nursing notes.  Pertinent labs & imaging results that were available during my care of the patient were reviewed by me and considered in my medical decision making (see chart for details).       Final Clinical Impressions(s) / ED Diagnoses MDM  Vital signs are within normal limits.  Oxygen level is 97% on room air.  Patient sustained an injury to the right ankle.  She has a small abrasion, but the tetanus status is up-to-date.  The x-ray is negative for fracture or dislocation.  The patient has had surgery on the right foot in the past and the hardware is within the normal limits.  The patient is fitted with an ankle splint for ankle sprain.  I have asked her to use ice to the area over the next couple of days.  The patient will use Tylenol and ibuprofen for soreness.  Patient will follow-up with Dr. Carlena SaxFosco if any changes in condition, problems, or concerns.   Final diagnoses:  Contusion of ankle, initial encounter  Sprain of right ankle, unspecified ligament, initial encounter    ED Discharge Orders    None       Ivery QualeBryant, Charley Miske, PA-C 04/22/18 1139    Sabas SousBero, Michael M, MD 04/22/18 [phone]

## 2018-04-18 NOTE — ED Triage Notes (Signed)
Patient states she hit her right foot on a door last night and is complaining of increasing swelling and pain to right foot.

## 2018-04-18 NOTE — Discharge Instructions (Addendum)
Your vital signs within normal limits.  Your x-ray is negative for fracture or dislocation.  Please use the ankle splint for the next 5 or 6 days.  Apply ice today and tomorrow.  Use ibuprofen 600 mg every 6 hours.  May use Tylenol every 4 hours in between doses if needed for soreness.  Please see Dr. Sherwood GamblerFusco for additional evaluation and management if not improving.

## 2018-05-03 ENCOUNTER — Other Ambulatory Visit: Payer: Self-pay | Admitting: Cardiovascular Disease

## 2018-05-30 DIAGNOSIS — I10 Essential (primary) hypertension: Secondary | ICD-10-CM | POA: Diagnosis not present

## 2018-05-30 DIAGNOSIS — Z1389 Encounter for screening for other disorder: Secondary | ICD-10-CM | POA: Diagnosis not present

## 2018-05-30 DIAGNOSIS — T07XXXA Unspecified multiple injuries, initial encounter: Secondary | ICD-10-CM | POA: Diagnosis not present

## 2018-05-30 DIAGNOSIS — Z681 Body mass index (BMI) 19 or less, adult: Secondary | ICD-10-CM | POA: Diagnosis not present

## 2018-05-30 DIAGNOSIS — F1120 Opioid dependence, uncomplicated: Secondary | ICD-10-CM | POA: Diagnosis not present

## 2018-06-13 DIAGNOSIS — Z308 Encounter for other contraceptive management: Secondary | ICD-10-CM | POA: Diagnosis not present

## 2018-06-13 DIAGNOSIS — R197 Diarrhea, unspecified: Secondary | ICD-10-CM | POA: Diagnosis not present

## 2018-06-13 DIAGNOSIS — G894 Chronic pain syndrome: Secondary | ICD-10-CM | POA: Diagnosis not present

## 2018-06-13 DIAGNOSIS — Z1389 Encounter for screening for other disorder: Secondary | ICD-10-CM | POA: Diagnosis not present

## 2018-06-13 DIAGNOSIS — R1110 Vomiting, unspecified: Secondary | ICD-10-CM | POA: Diagnosis not present

## 2018-06-13 DIAGNOSIS — Z681 Body mass index (BMI) 19 or less, adult: Secondary | ICD-10-CM | POA: Diagnosis not present

## 2018-08-01 DIAGNOSIS — F1120 Opioid dependence, uncomplicated: Secondary | ICD-10-CM | POA: Diagnosis not present

## 2018-08-01 DIAGNOSIS — G894 Chronic pain syndrome: Secondary | ICD-10-CM | POA: Diagnosis not present

## 2018-08-01 DIAGNOSIS — Z681 Body mass index (BMI) 19 or less, adult: Secondary | ICD-10-CM | POA: Diagnosis not present

## 2018-08-01 DIAGNOSIS — Z1389 Encounter for screening for other disorder: Secondary | ICD-10-CM | POA: Diagnosis not present

## 2018-08-01 DIAGNOSIS — N760 Acute vaginitis: Secondary | ICD-10-CM | POA: Diagnosis not present

## 2018-08-05 ENCOUNTER — Encounter: Payer: Self-pay | Admitting: *Deleted

## 2018-08-18 DIAGNOSIS — Z681 Body mass index (BMI) 19 or less, adult: Secondary | ICD-10-CM | POA: Diagnosis not present

## 2018-08-18 DIAGNOSIS — I200 Unstable angina: Secondary | ICD-10-CM | POA: Diagnosis not present

## 2018-08-18 DIAGNOSIS — Z1389 Encounter for screening for other disorder: Secondary | ICD-10-CM | POA: Diagnosis not present

## 2018-08-18 DIAGNOSIS — I10 Essential (primary) hypertension: Secondary | ICD-10-CM | POA: Diagnosis not present

## 2018-08-18 DIAGNOSIS — F419 Anxiety disorder, unspecified: Secondary | ICD-10-CM | POA: Diagnosis not present

## 2018-08-18 DIAGNOSIS — F1120 Opioid dependence, uncomplicated: Secondary | ICD-10-CM | POA: Diagnosis not present

## 2018-08-18 DIAGNOSIS — G894 Chronic pain syndrome: Secondary | ICD-10-CM | POA: Diagnosis not present

## 2019-06-25 ENCOUNTER — Other Ambulatory Visit: Payer: Self-pay

## 2019-06-25 ENCOUNTER — Inpatient Hospital Stay (HOSPITAL_COMMUNITY)
Admission: EM | Admit: 2019-06-25 | Discharge: 2019-06-27 | DRG: 062 | Disposition: A | Discharge status: Home / Self Care | Payer: Self-pay | Attending: Neurology | Admitting: Neurology

## 2019-06-25 ENCOUNTER — Encounter (HOSPITAL_COMMUNITY): Payer: Self-pay

## 2019-06-25 ENCOUNTER — Emergency Department (HOSPITAL_COMMUNITY): Payer: Self-pay

## 2019-06-25 DIAGNOSIS — F17200 Nicotine dependence, unspecified, uncomplicated: Secondary | ICD-10-CM

## 2019-06-25 DIAGNOSIS — Z23 Encounter for immunization: Secondary | ICD-10-CM

## 2019-06-25 DIAGNOSIS — Z7982 Long term (current) use of aspirin: Secondary | ICD-10-CM

## 2019-06-25 DIAGNOSIS — F329 Major depressive disorder, single episode, unspecified: Secondary | ICD-10-CM | POA: Diagnosis present

## 2019-06-25 DIAGNOSIS — I679 Cerebrovascular disease, unspecified: Secondary | ICD-10-CM

## 2019-06-25 DIAGNOSIS — Z20828 Contact with and (suspected) exposure to other viral communicable diseases: Secondary | ICD-10-CM | POA: Diagnosis present

## 2019-06-25 DIAGNOSIS — E7800 Pure hypercholesterolemia, unspecified: Secondary | ICD-10-CM | POA: Diagnosis present

## 2019-06-25 DIAGNOSIS — F419 Anxiety disorder, unspecified: Secondary | ICD-10-CM | POA: Diagnosis present

## 2019-06-25 DIAGNOSIS — G8194 Hemiplegia, unspecified affecting left nondominant side: Secondary | ICD-10-CM | POA: Diagnosis present

## 2019-06-25 DIAGNOSIS — F17210 Nicotine dependence, cigarettes, uncomplicated: Secondary | ICD-10-CM | POA: Diagnosis present

## 2019-06-25 DIAGNOSIS — R29704 NIHSS score 4: Secondary | ICD-10-CM | POA: Diagnosis present

## 2019-06-25 DIAGNOSIS — Z823 Family history of stroke: Secondary | ICD-10-CM

## 2019-06-25 DIAGNOSIS — I639 Cerebral infarction, unspecified: Principal | ICD-10-CM | POA: Diagnosis present

## 2019-06-25 DIAGNOSIS — E785 Hyperlipidemia, unspecified: Secondary | ICD-10-CM | POA: Diagnosis present

## 2019-06-25 DIAGNOSIS — Z79899 Other long term (current) drug therapy: Secondary | ICD-10-CM

## 2019-06-25 LAB — COMPREHENSIVE METABOLIC PANEL
ALT: 13 U/L (ref 0–44)
AST: 17 U/L (ref 15–41)
Albumin: 4.3 g/dL (ref 3.5–5.0)
Alkaline Phosphatase: 66 U/L (ref 38–126)
Anion gap: 12 (ref 5–15)
BUN: 17 mg/dL (ref 6–20)
CO2: 21 mmol/L — ABNORMAL LOW (ref 22–32)
Calcium: 9.1 mg/dL (ref 8.9–10.3)
Chloride: 105 mmol/L (ref 98–111)
Creatinine, Ser: 0.73 mg/dL (ref 0.44–1.00)
GFR calc Af Amer: >60 mL/min (ref >60)
GFR calc non Af Amer: >60 mL/min (ref >60)
Glucose, Bld: 101 mg/dL — ABNORMAL HIGH (ref 70–99)
Potassium: 3.2 mmol/L — ABNORMAL LOW (ref 3.5–5.1)
Sodium: 138 mmol/L (ref 135–145)
Total Bilirubin: 0.5 mg/dL (ref 0.3–1.2)
Total Protein: 7.6 g/dL (ref 6.5–8.1)

## 2019-06-25 LAB — CBC
HCT: 43.7 % (ref 36.0–46.0)
Hemoglobin: 14.8 g/dL (ref 12.0–15.0)
MCH: 32.5 pg (ref 26.0–34.0)
MCHC: 33.9 g/dL (ref 30.0–36.0)
MCV: 96 fL (ref 80.0–100.0)
Platelets: 261 10*3/uL (ref 150–400)
RBC: 4.55 MIL/uL (ref 3.87–5.11)
RDW: 12.3 % (ref 11.5–15.5)
WBC: 8.1 10*3/uL (ref 4.0–10.5)
nRBC: 0 % (ref 0.0–0.2)

## 2019-06-25 LAB — I-STAT CHEM 8, ED
BUN: 16 mg/dL (ref 6–20)
Calcium, Ion: 1.08 mmol/L — ABNORMAL LOW (ref 1.15–1.40)
Chloride: 103 mmol/L (ref 98–111)
Creatinine, Ser: 0.7 mg/dL (ref 0.44–1.00)
Glucose, Bld: 96 mg/dL (ref 70–99)
HCT: 42 % (ref 36.0–46.0)
Hemoglobin: 14.3 g/dL (ref 12.0–15.0)
Potassium: 3.7 mmol/L (ref 3.5–5.1)
Sodium: 138 mmol/L (ref 135–145)
TCO2: 24 mmol/L (ref 22–32)

## 2019-06-25 LAB — DIFFERENTIAL
Abs Immature Granulocytes: 0.02 10*3/uL (ref 0.00–0.07)
Basophils Absolute: 0 10*3/uL (ref 0.0–0.1)
Basophils Relative: 1 %
Eosinophils Absolute: 0.2 10*3/uL (ref 0.0–0.5)
Eosinophils Relative: 2 %
Immature Granulocytes: 0 %
Lymphocytes Relative: 39 %
Lymphs Abs: 3.2 10*3/uL (ref 0.7–4.0)
Monocytes Absolute: 0.5 10*3/uL (ref 0.1–1.0)
Monocytes Relative: 7 %
Neutro Abs: 4.2 10*3/uL (ref 1.7–7.7)
Neutrophils Relative %: 51 %

## 2019-06-25 LAB — PROTIME-INR
INR: 1 (ref 0.8–1.2)
Prothrombin Time: 12.8 seconds (ref 11.4–15.2)

## 2019-06-25 LAB — MRSA PCR SCREENING: MRSA by PCR: NEGATIVE

## 2019-06-25 LAB — HIV ANTIBODY (ROUTINE TESTING W REFLEX): HIV Screen 4th Generation wRfx: NONREACTIVE

## 2019-06-25 LAB — CBG MONITORING, ED: Glucose-Capillary: 97 mg/dL (ref 70–99)

## 2019-06-25 LAB — SARS CORONAVIRUS 2 BY RT PCR (HOSPITAL ORDER, PERFORMED IN ~~LOC~~ HOSPITAL LAB): SARS Coronavirus 2: NEGATIVE

## 2019-06-25 LAB — APTT: aPTT: 28 seconds (ref 24–36)

## 2019-06-25 MED ORDER — ALTEPLASE 100 MG IV SOLR
INTRAVENOUS | Status: AC
  Filled 2019-06-25: qty 100

## 2019-06-25 MED ORDER — ALPRAZOLAM 0.5 MG PO TABS: 1.0000 mg | ORAL_TABLET | Freq: Every day | ORAL | Status: DC | PRN

## 2019-06-25 MED ORDER — ROSUVASTATIN CALCIUM 20 MG PO TABS: 20.0000 mg | ORAL_TABLET | Freq: Every day | ORAL | Status: DC

## 2019-06-25 MED ORDER — ROPINIROLE HCL 1 MG PO TABS: 1.0000 mg | ORAL_TABLET | Freq: Every day | ORAL | Status: DC

## 2019-06-25 MED ORDER — ACETAMINOPHEN 650 MG RE SUPP: 650.0000 mg | RECTAL | Status: DC | PRN

## 2019-06-25 MED ORDER — ACETAMINOPHEN 160 MG/5ML PO SOLN: 650.0000 mg | ORAL | Status: DC | PRN

## 2019-06-25 MED ORDER — SODIUM CHLORIDE 0.9% FLUSH
3.0000 mL | Freq: Once | INTRAVENOUS | Status: DC
Start: 2019-06-25 — End: 2019-06-27

## 2019-06-25 MED ORDER — SODIUM CHLORIDE 0.9 % IV SOLN
50.0000 mL | Freq: Once | INTRAVENOUS | Status: AC
  Administered 2019-06-25: 50 mL via INTRAVENOUS

## 2019-06-25 MED ORDER — LABETALOL HCL 5 MG/ML IV SOLN: 20.0000 mg | Freq: Once | INTRAVENOUS | Status: DC

## 2019-06-25 MED ORDER — ALTEPLASE (STROKE) FULL DOSE INFUSION
43.2000 mg | Freq: Once | INTRAVENOUS | Status: AC
  Administered 2019-06-25: 43.2 mg via INTRAVENOUS

## 2019-06-25 MED ORDER — CLEVIDIPINE BUTYRATE 0.5 MG/ML IV EMUL: 0.0000 mg/h | INTRAVENOUS | Status: DC

## 2019-06-25 MED ORDER — PANTOPRAZOLE SODIUM 40 MG IV SOLR
40.0000 mg | Freq: Every day | INTRAVENOUS | Status: DC
  Administered 2019-06-25 – 2019-06-26 (×2): 40 mg via INTRAVENOUS
  Filled 2019-06-25 (×2): qty 40

## 2019-06-25 MED ORDER — INFLUENZA VAC SPLIT QUAD 0.5 ML IM SUSY
0.5000 mL | PREFILLED_SYRINGE | INTRAMUSCULAR | Status: AC
  Administered 2019-06-27: 0.5 mL via INTRAMUSCULAR
  Filled 2019-06-25: qty 0.5

## 2019-06-25 MED ORDER — MIRTAZAPINE 15 MG PO TABS
30.0000 mg | ORAL_TABLET | Freq: Every day | ORAL | Status: DC
  Administered 2019-06-25 – 2019-06-26 (×2): 30 mg via ORAL
  Filled 2019-06-25 (×2): qty 2

## 2019-06-25 MED ORDER — SENNOSIDES-DOCUSATE SODIUM 8.6-50 MG PO TABS: 1.0000 | ORAL_TABLET | Freq: Every evening | ORAL | Status: DC | PRN

## 2019-06-25 MED ORDER — ATORVASTATIN CALCIUM 40 MG PO TABS
40.0000 mg | ORAL_TABLET | Freq: Every day | ORAL | Status: DC
  Administered 2019-06-25: 40 mg via ORAL
  Filled 2019-06-25: qty 1

## 2019-06-25 MED ORDER — SODIUM CHLORIDE 0.9 % IV SOLN
INTRAVENOUS | Status: DC
  Administered 2019-06-25: 16:00:00 via INTRAVENOUS

## 2019-06-25 MED ORDER — CHLORHEXIDINE GLUCONATE CLOTH 2 % EX PADS
6.0000 | MEDICATED_PAD | Freq: Every day | CUTANEOUS | Status: DC
  Administered 2019-06-25: 6 via TOPICAL

## 2019-06-25 MED ORDER — ALTEPLASE (STROKE) FULL DOSE INFUSION: 43.2000 mg | Freq: Once | INTRAVENOUS | Status: DC

## 2019-06-25 MED ORDER — ACETAMINOPHEN 325 MG PO TABS: 650.0000 mg | ORAL_TABLET | ORAL | Status: DC | PRN

## 2019-06-25 MED ORDER — ATORVASTATIN CALCIUM 10 MG PO TABS: 20.0000 mg | ORAL_TABLET | Freq: Every day | ORAL | Status: DC

## 2019-06-25 MED ORDER — SODIUM CHLORIDE 0.9 % IV SOLN: 50.0000 mL | Freq: Once | INTRAVENOUS | Status: DC

## 2019-06-25 MED ORDER — ALBUTEROL SULFATE (2.5 MG/3ML) 0.083% IN NEBU: 2.5000 mg | INHALATION_SOLUTION | Freq: Four times a day (QID) | RESPIRATORY_TRACT | Status: DC | PRN

## 2019-06-25 MED ORDER — STROKE: EARLY STAGES OF RECOVERY BOOK
Freq: Once | Status: AC
  Administered 2019-06-25: 18:00:00
  Filled 2019-06-25: qty 4

## 2019-06-25 MED ORDER — ALBUTEROL SULFATE (2.5 MG/3ML) 0.083% IN NEBU
2.5000 mg | INHALATION_SOLUTION | Freq: Every day | RESPIRATORY_TRACT | Status: DC
  Filled 2019-06-25: qty 3

## 2019-06-25 NOTE — Consult Note (Signed)
[facility] TeleNeurology Consult Services   TeleStroke Metrics: LKW: [id number] Door Time: 1243  [facility] Contacted: 1025 [facility] at Bedside: 1251 NIHSS (assessment time): 1301   Interventional Candidate: Not a candidate as her symptoms are not consistent with a large vessel proximal occlusion   Chief Complaint: Left sided weakness and numbness/paresthesia   HPI: Asked to see this patient in emergent telemedicine consultation utilizing interactive audio and video technologies. Consultation was performed with assistance of ancillary / medical staff at bedside. Verbal consent to perform the examination with telemedicine was obtained. Patient agreed to proceed with the consultation for acute stroke protocol.  53 year old right-handed white female who comes to the emergency room by EMS as a stroke alert for acute left arm weakness and numbness.  Patient does not take any blood thinners or aspirin.  She has never had a stroke or TIA before.  She is an active smoker.  Patient was at work this afternoon.  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  Around 12:18 PM, she had acute onset of left arm weakness and numbness.  She denied any associated neck pain or pain in general.  She has never had symptoms like this before.    When she initially arrived in the ER, she had more significant left arm weakness and left arm incoordination with left-sided numbness.  Initial NIHSS was 4.  By the time I had the opportunity to evaluate the patient, her left arm was doing better and she still had some mild left arm incoordination.  She still reported left hemibody numbness.  She now noted more involvement of her left leg and she was having trouble standing.  When she stood on her bed scale, she was very off balance and required assistance to stand.  She stated that her left leg was now involved and she felt like her left side was becoming more tingly and numb.  Alteplase (tPA) Administration: Risk and  benefit of IV Alteplase were discussed. Risk includes a 6% chance of symptomatic intracranial hemorrhage. Benefit includes an approximate 30% chance of improving at 3 months with the medication versus a 20% chance of improving at 3 months without the medication. Inclusion criteria were reviewed. Exclusion criteria were reviewed and are all negative.  No recent issues with internal bleeding.  No recent surgeries.   Verbal Consent to Alteplase (tPA):    I have explained to the patient the nature of the patient's condition, the use of tPA fibrinolytic agent, and the benefits to be reasonably expected compared with alternative approaches. I have discussed the likelihood of major risks or complications of this procedure including (if applicable) but not limited to loss of limb function, brain damage, paralysis, hemorrhage, infection, complications from transfusion of blood components, drug reactions, blood clots and loss of life. I have also indicated that with any procedure there is always the possibility of an unexpected complication. I have explained the risks which include: 1. Death, Stroke or permanent neurologic injury (paralysis, coma, etc) 2. Worsening of stroke symptoms from swelling or bleeding in the brain 3. Bleeding in other parts of the body 4. Need for blood transfusions to replace blood or clotting factors 5. Allergic reaction to medications 6. Other unexpected complications  All questions were answered and the patient expressed understanding of the treatment plan and consented to the procedure.   Verbal Consent/Order: 1306 Alteplase Ordered: 1308 Alteplase Total Dose: 43.2 mg (weight 48 kg) Alteplase IV Bolus Dose: 4.3 gm given at 1312 Alteplase IV Infusion Dose: 38.9  mg started thereafter   Vial of Alteplase was visually confirmed by myself. Dosing of IV Alteplase was reviewed by myself and with the ER nurse prior to Alteplase administration. Blood pressure Pre-Alteplase  Administration: 125/85 at 1312 PMH: Hyperlipidemia, depression/anxiety, and cervical and lumbar spine stenosis   SOC: Positive for tobacco abuse.  Negative x 2.  Patient lives with family.   FMH: Significant for heart disease, stroke, and cancer.   ROS: 13 point review of systems were reviewed with the patient, and are all negative with the exception of the aforementioned in the history of present illness.   VS: Blood pressure 130/88, pulse 89, respiration 13   Exam: Patient is in no apparent distress.  Patient appears as stated age.  No obvious acute respiratory or cardiac distress.  Patient is well groomed and well-nourished. 1a- LOC: Keenly responsive - 0 1b- LOC questions: Answers both questions correctly - 0 1c- LOC commands- Performs both tasks correctly- 0 2- Gaze: Normal; no gaze paresis or gaze deviation - 0 3- Visual Fields: normal, no Visual field deficit - 0 4- Facial movements: no facial palsy - 0 5- Upper limb motor - no drift - 0 6- Lower limb motor - left leg drift - 1 7- Limb Coordination: left arm ataxia - 1 8- Sensory: left hemisensory loss - 1 9- Language - No aphasia - 0 10- Speech - No dysarthria -0 11- Neglect / Extinction - none found - 0 NIHSS score: 3   Diagnostic Data: CT head showed no acute intracranial hemorrhage, mass, or large territory stroke  Blood glucose 141   Medical Data Reviewed: 1.Data?reviewed include clinical labs, radiology,?and medical tests; 2.Tests?results discussed w/performing or interpreting physician; 3.Obtaining/reviewing old medical records; 4.Obtaining?case history from another source; 5.Independent?review of image, tracing, or specimen.   Medical Decision Making: - Extensive number of diagnosis or management options are considered below. - Extensive amount of complex data reviewed. - High risk of complication and/or morbidity or mortality are associated with differential diagnostic considerations below. - There may  be?uncertain?outcome and increased probability of prolonged functional impairment or high probability of severe prolonged functional impairment associated with some of these differential diagnosis.   Differential Diagnosis for Stroke: 1.?Cardioembolic?stroke 2. Small vessel disease/lacune 3. Thromboembolic, artery-to-artery mechanism 4.?Hypercoagulable?state-related infarct 5. Transient ischemic attack 6. Thrombotic mechanism, large artery disease   Assessment: 1.  Acute right subcortical stroke status post IV alteplase 2.  Tobacco abuse 3.  Hyperlipidemia 4.  Depression/anxiety 5.  Cervical and lumbar spine stenosis  Recommendations: Patient should be admitted to the inpatient hospitalist service and be monitored in the ICU. Monitor and document vital signs/blood pressure with neuro checks/NIHSS for the first 24 hours after receiving IV Alteplase using the following parameters: -Check every 15 minutes for 2 hours following IV Alteplase administration -Then check every 30 minutes for 6 hours -Then check every 1 hour for 16 hours Allow permissive hypertension. If SBP > 180 or DBP > 105 on 2 consecutive checks, then administer PRN IV anti-hypertensive medication (Labetalol 10-20 mg IV over 1-2 minutes or start Nicardipine drip at 5 mg/hr and can titrate up every 2.5 mg/hr until parameters are achieved) and notify MD. No anti-platelets or Lovenox for 24 hours s/p IV Alteplase per protocol. Place SCDs for DVT prevention. Repeat head CT in 24 hours s/p IV Alteplase per protocol. Obtain STAT head CT for any new acute headache or new neurological deficits Continue telemetry monitoring to look for paroxysmal atrial fibrillation. Check echocardiogram. Check brain MRI. Check  MRA of the head and neck to better evaluate her intracranial and extracranial blood vessels Consult PT, OT, and ST. Check hemoglobin A1c and lipid panel and urine drug screen. Would recommend to consult local Neurology  provider to see patient in follow-up consultation on the floor. Plan of care was discussed with the patient.   Thank you for allowing [facility] to participate in the care of your patient. Please call me, Dr. Adrienne MochaSombutmai, with any questions at [phone]. Case discussed with the ER staff and Dr. Hyacinth MeekerMiller.   Critical Care notation:   I was called to see this critical patient emergently. I personally evaluated this critical patient for acute stroke evaluation, and determining their eligibility for IV Alteplase and interventional therapies.  I have spent approximately 24 minutes with the patient, including time at bedside, time discussing the case with other physicians, reviewing plan of care, and time independently reviewing the records and scans.

## 2019-06-25 NOTE — ED Triage Notes (Signed)
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 Coworker called ems and was brought straight here.

## 2019-06-25 NOTE — ED Notes (Signed)
Spoke with Abbe Amsterdam at [facility] will page neurologist at [facility] to Dr. Sabra Heck.

## 2019-06-25 NOTE — ED Notes (Signed)
CODE Stroke page out

## 2019-06-25 NOTE — H&P (Addendum)
NEURO HOSPITALIST  H&P   Chief Complaint:  Left arm numbness and left leg weakness  History obtained from:  Patient     HPI:                                                                                                                                         Katelyn Smith is an 53 y.o. female  PMH tobacco use (smokes 1/2 pack per day), HLD who presented to [facility] for c/o left arm numbness and left leg weakness. Code stroke was initiated and patient transferred to [facility] after receiving tPA.  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 The numbness then traveled up her left arm. While she was cooking she went to take some steps to the waffle iron and her left leg gave away. A co-worker caught her and helped get her to the back. She did not fall. Denies any vision problems, CP, SOB, dizziness or being light headed. Denies any blood thinner usage. Patient stopped taking daily ASA 3 months ago. Endorses smoking 1/2 pack of cigarettes per day. Denies EtOH or drug use. No prior stroke or TIA history.   Hospital course:  CTH: no hemorrhage  Initial NIHSS: 4  Date last known well: 06/25/2019 Time last known well: 1218 tPA Given:yes started @ 1312 Modified Rankin: Rankin Score=0 NIHSS:0   Past Medical History:  Diagnosis Date  . Anxiety   . Chest pain    a. 07/2016: Stress test showing no evidence of ischemia. b. cath 08/2016: 40% Ost RCA stenosis, 70% Lateral 3rd Mrg, and 80% Acute Mrg (too small for intervention, therefore medical therapy recommended).   . Chronic back pain   . Chronic neck pain   . Depression   . H/O degenerative disc disease   . High cholesterol   . Lumbar radiculopathy, right   . Tobacco abuse     Past Surgical History:  Procedure Laterality Date  . BACK SURGERY    . CARDIAC CATHETERIZATION N/A 09/09/2016   Procedure: Left Heart Cath and Coronary Angiography;   Surgeon: Peter M Martinique, MD;  Location: [place] [facility];  Service: Cardiovascular;  Laterality: N/A;  . CARDIAC CATHETERIZATION  09/09/2016  . TUBAL LIGATION      Family History  Problem Relation Age of Onset  . Heart failure Mother   . Heart disease Mother 97       first MI early 74s  . Cancer Father        Died at 58  . Cancer Sister   .  Stroke Other   . Heart disease Other   . Stroke Maternal Grandmother          Social History:  reports that she has been smoking cigarettes. She has a 15.00 pack-year smoking history. She has never used smokeless tobacco. She reports that she does not drink alcohol or use drugs.  Allergies:  Allergies  Allergen Reactions  . Mirabegron Hypertension    Medications:                                                                                                                           Scheduled: .  stroke: mapping our early stages of recovery book   Does not apply Once  . albuterol  2.5 mg Inhalation QHS  . atorvastatin  40 mg Oral Daily  . Chlorhexidine Gluconate Cloth  6 each Topical Daily  . [START ON 06/26/2019] influenza vac split quadrivalent PF  0.5 mL Intramuscular Tomorrow-1000  . labetalol  20 mg Intravenous Once  . mirtazapine  30 mg Oral QHS  . pantoprazole (PROTONIX) IV  40 mg Intravenous QHS  . rOPINIRole  1 mg Oral QHS  . sodium chloride flush  3 mL Intravenous Once   Continuous: . sodium chloride 75 mL/hr at 06/25/19 1604  . clevidipine     WUJ:WJXBJYNWGNFAO **OR** acetaminophen (TYLENOL) oral liquid 160 mg/5 mL **OR** acetaminophen, ALPRAZolam, senna-docusate   ROS:                                                                                                                                       ROS was performed and is negative except as noted in HPI    General Examination:                                                                                                      Blood pressure 126/74, pulse 70,  temperature 97.8 F (36.6 C), temperature source Oral, resp. rate (!) 9, height  5\' 3"  (1.6 m), weight 51 kg, SpO2 97 %.  HEENT-  Normocephalic, no lesions, without obvious abnormality.  Normal external eye and conjunctiva. Cardiovascular-  pulses palpable throughout  Lungs no excessive working breathing.  Saturations within normal limits on RA Extremities- Warm, dry and intact Musculoskeletal-no joint tenderness, deformity or swelling Skin-warm and dry, no hyperpigmentation, vitiligo, or suspicious lesions  Neurological Examination Mental Status: Alert, oriented name/age/month/year/city, thought content appropriate.  Speech fluent without evidence of aphasia.  No dysarthria. naming intact. Able to follow commands without difficulty. Cranial Nerves: II: Visual fields grossly normal,  III,IV, VI: ptosis not present, extra-ocular motions intact bilaterally, pupils equal, round, reactive to light and accommodation V,VII: smile symmetric, facial light touch sensation normal bilaterally VIII: hearing normal bilaterally IX,X: uvula rises midline XI: bilateral shoulder shrug XII: midline tongue extension Motor: Right : Upper extremity   5/5  Left:     Upper extremity   5/5  Lower extremity   5/5   Lower extremity   5/5 Tone and bulk:normal tone throughout; no atrophy noted Sensory:  light touch intact throughout, bilaterally Deep Tendon Reflexes: 2+ and symmetric biceps and patellae Plantars: Right: downgoing   Left: downgoing Cerebellar: normal finger-to-nose,and normal heel-to-shin test Gait: deferred   Lab Results: Basic Metabolic Panel: Recent Labs  Lab 06/25/19 1253 06/25/19 1321  NA 138 138  K 3.2* 3.7  CL 105 103  CO2 21*  --   GLUCOSE 101* 96  BUN 17 16  CREATININE 0.73 0.70  CALCIUM 9.1  --     CBC: Recent Labs  Lab 06/25/19 1253 06/25/19 1321  WBC 8.1  --   NEUTROABS 4.2  --   HGB 14.8 14.3  HCT 43.7 42.0  MCV 96.0  --   PLT 261  --     CBG: Recent Labs   Lab 06/25/19 1312  GLUCAP 97    Imaging: Ct Head Code Stroke Wo Contrast  Result Date: 06/25/2019 CLINICAL DATA:  Code stroke. Left-sided numbness and weakness. Rule out stroke. EXAM: CT HEAD WITHOUT CONTRAST TECHNIQUE: Contiguous axial images were obtained from the base of the skull through the vertex without intravenous contrast. COMPARISON:  None. FINDINGS: Brain: No evidence of acute infarction, hemorrhage, hydrocephalus, extra-axial collection or mass lesion/mass effect. Vascular: Negative for hyperdense vessel Skull: Negative Sinuses/Orbits: Negative Other: None ASPECTS ([place] Stroke Program Early CT Score) - Ganglionic level infarction (caudate, lentiform nuclei, internal capsule, insula, M1-M3 cortex): 7 - Supraganglionic infarction (M4-M6 cortex): 3 Total score (0-10 with 10 being normal): 10 IMPRESSION: 1. No acute intracranial abnormality 2. ASPECTS is 10 3. These results were called by telephone at the time of interpretation on 06/25/2019 at 1:03 pm to provider [facility] , who verbally acknowledged these results. Electronically Signed   By: [facility] M.D.   On: 06/25/2019 13:04    06/27/2019, MSN, NP-C [facility] [phone] 06/25/2019, 4:11 PM   Assessment: 53 y.o. female with PMHx of tobacco use (smokes 1/2 pack per day) and HLD who presented to [facility] for c/c of left arm numbness and left leg weakness. Code stroke was initiated at [facility]. CTH revealed no hemorrhage. Initial NIHSS was 4; she was seen by tele neurology and tPA was administered. Patient transferred to [facility] for post tPA care. Stroke Risk Factors - family history, hyperlipidemia and smoking    Plan. -- BP goal : BP management post-tPA. Keep BP below 180/174mmHg, -- MRI Brain  -- Echocardiogram -- continue crestor; if LDL > 70 consider  changing -- HgbA1c, fasting lipid panel -- PT consult, OT consult, Speech consult --Telemetry monitoring --Frequent neuro checks --Stroke swallow  screen --CTA head and neck --No antiplatelet medications or anticoagulants for at least 24 hours following tPA. May start after 24 hours if repeat CT is negative for hemorrhage.  CNS  -Close neuro monitoring -monitor  RESP monitor  CV - BP control, goal SBP <180 /105    GI/GU -Gentle hydration  HEME -Monitor -transfuse for hgb < 7  ENDO -goal HgbA1c < 7  ID -Monitor  Nutrition monitor  Prophylaxis DVT: SCD's only  GI: doc/senna  Dispo TBD  Diet: NPO until cleared by speech or bedside swallow eval  Code Status: Full Code    I have seen and examined the patient. 53 year old smoker with acute onset of left sided sensory and motor deficits. Arrived at OSH within the tPA time window and was administered IV tPA after CT head showed no hemorrhage. [facility] post-tPA management and stroke work up. Exam here is normal. Diagnostic and treatment plan as above.  Electronically signed: Dr. Caryl PinaEric Reade Trefz

## 2019-06-25 NOTE — ED Notes (Signed)
Pt transported to CT ?

## 2019-06-25 NOTE — ED Notes (Signed)
[facility] in room assessing  Patient. Patient transported to [facility] operational officer.

## 2019-06-25 NOTE — ED Notes (Signed)
Report given to Arboriculturist at [facility].

## 2019-06-25 NOTE — ED Notes (Signed)
Alteplase administered. Verified total dose of 43.2mg , bolus 4.3mg , infusion over 1 hour 38.9mg , and waste of 56.8mg . Verified with Teleneuro MD and Marcene Brawn, [place]. Having difficulty with order in Epic.

## 2019-06-25 NOTE — Progress Notes (Signed)
CODE STROKE 12 32  Call 1236 beeper 1245 exam started, 1247 exam finished, images to [facility] 1251 exam complete in Epic 1253 GR called spoke with Carloyn Manner

## 2019-06-25 NOTE — ED Provider Notes (Signed)
[facility]Y [facility] Provider Note   CSN: [id number] Arrival date & time: 06/25/19  1243  An emergency department physician performed an initial assessment on this suspected stroke patient at 1244.  History   Chief Complaint Chief Complaint  Patient presents with  . Code Stroke    HPI Katelyn Smith is a 53 y.o. female.     HPI  This patient is a 53 year old female with a history of anxiety, she is a smoker, she had a negative stress test on her heart in 2017 followed by a heart catheterization in January 2018 showing an 80% marginal which was too small for intervention.  She currently takes medication for high cholesterol as well as an aspirin, no other anticoagulants.  She was at work working at [employer] when at 12:18 PM, approximately 25 minutes prior to arrival she developed acute onset of left arm numbness and tingling, states that she could not use her arm right.  There was no other acute symptoms.  She has never had anything like this.  It is been persistent, paramedics were called immediately and transported the patient to the hospital.  She denies recently feeling poorly at all, denies any anxiety leading up to this.  Symptoms are persistent, she now feels some symptoms in her leg as well.  Additional history obtained from the paramedics who report that the patient has had some difficulty using her left arm in route to the hospital.  Past Medical History:  Diagnosis Date  . Anxiety   . Chest pain    a. 07/2016: Stress test showing no evidence of ischemia. b. cath 08/2016: 40% Ost RCA stenosis, 70% Lateral 3rd Mrg, and 80% Acute Mrg (too small for intervention, therefore medical therapy recommended).   . Chronic back pain   . Chronic neck pain   . Depression   . H/O degenerative disc disease   . High cholesterol   . Lumbar radiculopathy, right   . Tobacco abuse     Patient Active Problem List   Diagnosis Date Noted  . Acute ischemic stroke (HCC)  06/25/2019  . CAD (coronary artery disease) 09/24/2016  . Unstable angina (HCC)   . Chest pain 08/10/2016  . Hyperlipidemia 08/10/2016  . Chronic pain syndrome 08/10/2016  . Tobacco abuse 08/10/2016  . Hypokalemia 08/10/2016  . Urge incontinence 12/15/2012  . Chronic low back pain 12/15/2012    Past Surgical History:  Procedure Laterality Date  . BACK SURGERY    . CARDIAC CATHETERIZATION N/A 09/09/2016   Procedure: Left Heart Cath and Coronary Angiography;  Surgeon: Peter M SwazilandJordan, MD;  [facility];  Service: Cardiovascular;  Laterality: N/A;  . CARDIAC CATHETERIZATION  09/09/2016  . TUBAL LIGATION       OB History    Gravida  3   Para  3   Term  3   Preterm      AB      Living  3     SAB      TAB      Ectopic      Multiple      Live Births               Home Medications    Prior to Admission medications   Medication Sig Start Date End Date Taking? Authorizing Provider  ALPRAZolam Prudy Feeler(XANAX) 1 MG tablet Take 1 mg by mouth daily as needed for anxiety or sleep.     [provider]  aspirin  EC 81 MG EC tablet Take 1 tablet (81 mg total) by mouth daily. 08/11/16   Joseph Art, DO  atorvastatin (LIPITOR) 20 MG tablet TAKE 1 TABLET BY MOUTH EVERY DAY Patient not taking: Reported on 12/03/2017 04/13/17   Iran Ouch, Lennart Pall, PA-C  isosorbide mononitrate (IMDUR) 30 MG 24 hr tablet Take 1 tablet (30 mg total) by mouth daily. NEED OV. 05/04/18   Chilton Si, MD  mirtazapine (REMERON) 30 MG tablet Take 30 mg by mouth at bedtime.    [provider]  nitroGLYCERIN (NITROSTAT) 0.4 MG SL tablet PLACE AND DISSOLVE 1 TABLET UNDER THE TONGUE EVERY 5 MINUTES AS NEEDED FOR CHEST PAIN. MAY TAKE UP TO 3 DOSES. IF NO RELIEF CALL 911 04/13/17   Iran Ouch, [place] M, PA-C  nitroGLYCERIN (NITROSTAT) 0.4 MG SL tablet PLACE 1 TABLET UNDER THE TONGUE EVERY 5 MINUTES AS NEEDED FOR CHEST PAIN. MAY TAKE UP TO 3 DOSES. IF NO RELIEF CALL 911 11/24/17    [place] Ouch, Lennart Pall, PA-C  ondansetron (ZOFRAN) 4 mg TABS tablet Take 4 tablets by mouth every 8 (eight) hours as needed. Patient not taking: Reported on 12/03/2017 09/17/16   Mancel Bale, MD  oxycodone (ROXICODONE) 30 MG immediate release tablet Take 20 mg by mouth every 4 (four) hours as needed (for pain).    [provider]  Probiotic Product (PROBIOTIC DAILY PO) Take 1 tablet by mouth every evening.    [provider]  rOPINIRole (REQUIP) 1 MG tablet Take 1 mg by mouth at bedtime.    [provider]    Family History Family History  Problem Relation Age of Onset  . Heart failure Mother   . Heart disease Mother 22       first MI early 26s  . Cancer Father        Died at 53  . Cancer Sister   . Stroke Other   . Heart disease Other     Social History Social History   Tobacco Use  . Smoking status: Current Every Day Smoker    Packs/day: 0.50    Years: 30.00    Pack years: 15.00    Types: Cigarettes  . Smokeless tobacco: Never Used  . Tobacco comment: Since age 63  Substance Use Topics  . Alcohol use: No  . Drug use: No     Allergies   Mirabegron   Review of Systems Review of Systems  All other systems reviewed and are negative.    Physical Exam Updated Vital Signs BP 138/82   Pulse 78   Resp 18   Wt 47.8 kg   SpO2 95%   BMI 19.28 kg/m   Physical Exam Vitals signs and nursing note reviewed.  Constitutional:      General: She is in acute distress.     Appearance: She is well-developed.  HENT:     Head: Normocephalic and atraumatic.     Mouth/Throat:     Pharynx: No oropharyngeal exudate.  Eyes:     General: No scleral icterus.       Right eye: No discharge.        Left eye: No discharge.     Conjunctiva/sclera: Conjunctivae normal.     Pupils: Pupils are equal, round, and reactive to light.  Neck:     Musculoskeletal: Normal range of motion and neck supple.     Thyroid: No thyromegaly.     Vascular: No JVD.   Cardiovascular:     Rate and Rhythm: Regular  rhythm. Tachycardia present.     Heart sounds: Normal heart sounds. No murmur. No friction rub. No gallop.   Pulmonary:     Effort: Pulmonary effort is normal. No respiratory distress.     Breath sounds: Normal breath sounds. No wheezing or rales.  Abdominal:     General: Bowel sounds are normal. There is no distension.     Palpations: Abdomen is soft. There is no mass.     Tenderness: There is no abdominal tenderness.  Musculoskeletal: Normal range of motion.        General: No tenderness.  Lymphadenopathy:     Cervical: No cervical adenopathy.  Skin:    General: Skin is warm and dry.     Findings: No erythema or rash.  Neurological:     Mental Status: She is alert.     Coordination: Coordination normal.     Comments: The patient has dysmetria of the left upper extremity, there is pronator drift on the left, she has 4 out of 5 strength in the left arm and the left leg, there is a sensory deficit on the left side compared to the right side.  She has clear speech, there is no visual abnormalities, peripheral visual fields are normal, extraocular movements are normal, she is not lateralizing with regards to sensation or neglect.  Psychiatric:        Behavior: Behavior normal.      ED Treatments / Results  Labs (all labs ordered are listed, but only abnormal results are displayed) Labs Reviewed  COMPREHENSIVE METABOLIC PANEL - Abnormal; Notable for the following components:      Result Value   Potassium 3.2 (*)    CO2 21 (*)    Glucose, Bld 101 (*)    All other components within normal limits  I-STAT CHEM 8, ED - Abnormal; Notable for the following components:   Calcium, Ion 1.08 (*)    All other components within normal limits  SARS CORONAVIRUS 2 BY RT PCR (HOSPITAL ORDER, [place] [facility])  PROTIME-INR  APTT  CBC  DIFFERENTIAL  CBG MONITORING, ED    EKG EKG Interpretation  Date/Time:  Sunday June 25 2019 13:12:31 EDT Ventricular Rate:  84 PR Interval:    QRS Duration: 133 QT Interval:  399 QTC Calculation: 472 R Axis:   113 Text Interpretation:  Sinus rhythm Biatrial enlargement RBBB and LPFB Baseline wander in lead(s) V6 since last tracing no significant change Confirmed by Noemi Chapel [phone]) on 06/25/2019 1:31:47 PM   Radiology Ct Head Code Stroke Wo Contrast  Result Date: 06/25/2019 CLINICAL DATA:  Code stroke. Left-sided numbness and weakness. Rule out stroke. EXAM: CT HEAD WITHOUT CONTRAST TECHNIQUE: Contiguous axial images were obtained from the base of the skull through the vertex without intravenous contrast. COMPARISON:  None. FINDINGS: Brain: No evidence of acute infarction, hemorrhage, hydrocephalus, extra-axial collection or mass lesion/mass effect. Vascular: Negative for hyperdense vessel Skull: Negative Sinuses/Orbits: Negative Other: None ASPECTS ([place] Stroke Program Early CT Score) - Ganglionic level infarction (caudate, lentiform nuclei, internal capsule, insula, M1-M3 cortex): 7 - Supraganglionic infarction (M4-M6 cortex): 3 Total score (0-10 with 10 being normal): 10 IMPRESSION: 1. No acute intracranial abnormality 2. ASPECTS is 10 3. These results were called by telephone at the time of interpretation on 06/25/2019 at 1:03 pm to provider [facility] , who verbally acknowledged these results. Electronically Signed   By: Franchot Gallo M.D.   On: 06/25/2019 13:04    Procedures .Critical Care  Performed by: Eber Hong, MD Authorized by: Eber Hong, MD   Critical care provider statement:    Critical care time (minutes):  35   Critical care time was exclusive of:  Separately billable procedures and treating other patients and teaching time   Critical care was necessary to treat or prevent imminent or life-threatening deterioration of the following conditions:  CNS failure or compromise   Critical care was time spent personally by me on the following  activities:  Blood draw for specimens, development of treatment plan with patient or surrogate, discussions with consultants, evaluation of patient's response to treatment, examination of patient, obtaining history from patient or surrogate, ordering and performing treatments and interventions, ordering and review of laboratory studies, ordering and review of radiographic studies, pulse oximetry, re-evaluation of patient's condition and review of old charts   (including critical care time)  Medications Ordered in ED Medications  sodium chloride flush (NS) 0.9 % injection 3 mL (3 mLs Intravenous Not Given 06/25/19 1255)  alteplase (ACTIVASE) 1 mg/mL injection (has no administration in time range)  alteplase (ACTIVASE) 1 mg/mL infusion 43.2 mg (4.3 mg Intravenous New Bag/Given 06/25/19 1312)    Followed by  0.9 %  sodium chloride infusion (has no administration in time range)     Initial Impression / Assessment and Plan / ED Course  I have reviewed the triage vital signs and the nursing notes.  Pertinent labs & imaging results that were available during my care of the patient were reviewed by me and considered in my medical decision making (see chart for details).  Clinical Course as of Jun 25 1331  Sun Jun 25, 2019  1303 Radiology states no acute findings on CT scan of the brain.  I have personally looked at the x-ray images and see no acute hemorrhage.   [BM]    Clinical Course User Index [BM] Eber Hong, MD       The patient appears to had an acute ischemic event, she is within 30 minutes at this time, will consult with telemedicine to activate code stroke.  She is in the CT scanner right now.  She appears acutely ill with an acute ischemic stroke most likely, she does not have a headache but will rule out hemorrhage with CT  This patient has a clinical exam that is consistent with an acute ischemic stroke.  The CT scan does not show any evidence of hemorrhage or obvious infarct  however she came within the first 30 minutes of symptoms making it less likely to see anything anyway.  The neurologist has discussed this with the patient at length as have I and she is aware of the risks benefits and alternatives of the medications including thrombolytic therapy and has agreed.  She is currently getting this as an infusion.  I discussed the care with Dr. Otelia Limes who is the accepting neurologist at [facility], I have verified an opening bed in the ICU, the patient will go to the intensive care unit overnight, her blood pressure is currently 138 systolic.  Her speech is clear and on repeat exam still has some left-sided weakness some numbness during the infusion of TPA.  Final Clinical Impressions(s) / ED Diagnoses   Final diagnoses:  Acute ischemic stroke [facility])      Eber Hong, MD 06/25/19 [id number]

## 2019-06-26 ENCOUNTER — Inpatient Hospital Stay (HOSPITAL_COMMUNITY): Payer: Self-pay

## 2019-06-26 DIAGNOSIS — I6389 Other cerebral infarction: Secondary | ICD-10-CM

## 2019-06-26 DIAGNOSIS — I639 Cerebral infarction, unspecified: Secondary | ICD-10-CM

## 2019-06-26 LAB — LIPID PANEL
Cholesterol: 144 mg/dL (ref 0–200)
HDL: 32 mg/dL — ABNORMAL LOW (ref >40)
LDL Cholesterol: 92 mg/dL (ref 0–99)
Total CHOL/HDL Ratio: 4.5 RATIO
Triglycerides: 99 mg/dL (ref <150)
VLDL: 20 mg/dL (ref 0–40)

## 2019-06-26 LAB — HEMOGLOBIN A1C
Hgb A1c MFr Bld: 5.6 % (ref 4.8–5.6)
Mean Plasma Glucose: 114.02 mg/dL

## 2019-06-26 LAB — ECHOCARDIOGRAM COMPLETE
Height: 63 in
Weight: 1798.95 oz

## 2019-06-26 MED ORDER — ATORVASTATIN CALCIUM 40 MG PO TABS: 40.0000 mg | ORAL_TABLET | Freq: Every day | ORAL | Status: DC

## 2019-06-26 MED ORDER — ASPIRIN EC 81 MG PO TBEC
81.0000 mg | DELAYED_RELEASE_TABLET | Freq: Every day | ORAL | Status: DC
  Administered 2019-06-26 – 2019-06-27 (×2): 81 mg via ORAL
  Filled 2019-06-26 (×2): qty 1

## 2019-06-26 MED ORDER — OXYCODONE HCL 5 MG PO TABS: 10.0000 mg | ORAL_TABLET | ORAL | Status: DC | PRN

## 2019-06-26 MED ORDER — ATORVASTATIN CALCIUM 40 MG PO TABS
40.0000 mg | ORAL_TABLET | Freq: Every day | ORAL | Status: DC
  Administered 2019-06-26: 40 mg via ORAL
  Filled 2019-06-26: qty 1

## 2019-06-26 NOTE — Progress Notes (Signed)
  Echocardiogram 2D Echocardiogram has been performed.  Katelyn Smith 06/26/2019, 1:31 PM

## 2019-06-26 NOTE — Progress Notes (Signed)
Carotid artery duplex has been completed. Preliminary results can be found in CV Proc through chart review.   06/26/19 11:06 AM Carlos Levering RVT

## 2019-06-26 NOTE — Evaluation (Signed)
Occupational Therapy Evaluation Patient Details Name: Katelyn Smith MRN: [id number] DOB: [birth year] Today's Date: 06/26/2019    History of Present Illness 53 yo female presenting with left-side numbness and weakness. tPA given on 10/25. CT showing no acute abnormalities. Awaiting MRI. PMH including tobacco use (smokes 1/2 pack per day) and HLD.   Clinical Impression   PTA, pt was living with her husband and daughter and was independent and working as a Archivist. Pt currently performing ADLs and functional mobility at [place] I level demonstrating near baseline function. Pt presenting with decreased problem solving during simple money management task; requiring external aides and increased time. Recommend dc to home once medically stable per physician. All acute OT needs met and will sign off.     Follow Up Recommendations  No OT follow up    Equipment Recommendations  None recommended by OT    Recommendations for Other Services       Precautions / Restrictions Precautions Precautions: None      Mobility Bed Mobility Overal bed mobility: Modified Independent             General bed mobility comments: increased time  Transfers Overall transfer level: Modified independent               General transfer comment: Increased time as needed. No physical A needed    Balance                                           ADL either performed or assessed with clinical judgement   ADL Overall ADL's : Needs assistance/impaired Eating/Feeding: Independent;Sitting   Grooming: Wash/dry hands;Oral care;Supervision/safety;Standing Grooming Details (indicate cue type and reason): Performed hand hygiene and oral care standing at sink with supervision for safety Upper Body Bathing: Supervision/ safety;Sitting   Lower Body Bathing: Supervison/ safety;Sit to/from stand   Upper Body Dressing : Supervision/safety;Sitting   Lower Body Dressing: Sit  to/from stand;Supervision/safety Lower Body Dressing Details (indicate cue type and reason): Pt adjusted socks sitting EOB with supervision for safety Toilet Transfer: Supervision/safety;Ambulation;Regular Toilet   Toileting- Water quality scientist and Hygiene: Supervision/safety;Sit to/from stand       Functional mobility during ADLs: Supervision/safety General ADL Comments: Pt performed most ADLs with supervision for safety. Feel pt is close to baseline     Vision Baseline Vision/History: Wears glasses Wears Glasses: Reading only Patient Visual Report: No change from baseline Vision Assessment?: No apparent visual deficits     Perception     Praxis      Pertinent Vitals/Pain Pain Assessment: No/denies pain     Hand Dominance Right   Extremity/Trunk Assessment Upper Extremity Assessment Upper Extremity Assessment: Overall WFL for tasks assessed   Lower Extremity Assessment Lower Extremity Assessment: Overall WFL for tasks assessed   Cervical / Trunk Assessment Cervical / Trunk Assessment: Kyphotic   Communication Communication Communication: No difficulties   Cognition Arousal/Alertness: Awake/alert Behavior During Therapy: WFL for tasks assessed/performed Overall Cognitive Status: Impaired/Different from baseline Area of Impairment: Problem solving                             Problem Solving: Slow processing General Comments: Feel pt is close to baseline cognition. Pt demonstrated decreased problem solving to perform simple money management question. Pt required increased time and to write down question on paper to solve.  Pt recalled 2/3 words after 5 minutes, and was able to recall the last word with one verbal cues.    General Comments  VSS throughout    Exercises     Shoulder Instructions      Home Living Family/patient expects to be discharged to:: Private residence Living Arrangements: Spouse/significant other;Children(26 yo  daughter) Available Help at Discharge: Family Type of Home: House Home Access: Stairs to enter Technical brewer of Steps: 4 Entrance Stairs-Rails: Can reach both Home Layout: Two level;Able to live on main level with bedroom/bathroom     Bathroom Shower/Tub: Teacher, early years/pre: Standard     Home Equipment: Environmental consultant - 2 wheels;Cane - single point          Prior Functioning/Environment Level of Independence: Independent        Comments: Pt works as a Physiological scientist in [place]; independent for ADLs, IADLs, and driving        OT Problem List: Decreased activity tolerance;Decreased cognition;Decreased knowledge of precautions      OT Treatment/Interventions:      OT Goals(Current goals can be found in the care plan section) Acute Rehab OT Goals Patient Stated Goal: "go home soon" OT Goal Formulation: All assessment and education complete, DC therapy  OT Frequency:     Barriers to D/C:            Co-evaluation              AM-PAC OT "6 Clicks" Daily Activity     Outcome Measure Help from another person eating meals?: None Help from another person taking care of personal grooming?: None Help from another person toileting, which includes using toliet, bedpan, or urinal?: None Help from another person bathing (including washing, rinsing, drying)?: None Help from another person to put on and taking off regular upper body clothing?: None Help from another person to put on and taking off regular lower body clothing?: None 6 Click Score: 24   End of Session Nurse Communication: Mobility status  Activity Tolerance: Patient tolerated treatment well Patient left: in chair;with call bell/phone within reach  OT Visit Diagnosis: Other abnormalities of gait and mobility (R26.89);Other symptoms and signs involving cognitive function                Time: 4174-0814 OT Time Calculation (min): 13 min Charges:  OT General Charges $OT Visit: 1 Visit OT  Evaluation $OT Eval Low Complexity: 1 Low  Charis Capehart MSOT, OTR/L Acute Rehab Pager: [phone] Office: [place] 06/26/2019, 10:42 AM

## 2019-06-26 NOTE — Progress Notes (Signed)
OT Cancellation Note  Patient Details Name: Katelyn Smith MRN: [id number] DOB: 09/08/1965   Cancelled Treatment:    Reason Eval/Treat Not Completed: Active bedrest order. Will return as schedule allows.   [place], OTR/L Acute Rehab Pager: [phone] Office: [phone] 06/26/2019, 7:32 AM

## 2019-06-26 NOTE — Procedures (Signed)
Echo attempted. Patient eating. Will attempt again. 

## 2019-06-26 NOTE — Progress Notes (Signed)
STROKE TEAM PROGRESS NOTE   HISTORY OF PRESENT ILLNESS (per record) Katelyn Smith is an 53 y.o. female  PMH tobacco use (smokes 1/2 pack per day), HLD who presented to [facility] for c/o left arm and leg numbness, then left leg weakness. NIHSS 4. Code stroke was initiated and patient transferred to [facility] after receiving tPA.   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 The numbness then traveled up her left arm. While she was cooking she went to take some steps to the waffle iron and her left leg gave away. A co-worker caught her and helped get her to the back. She did not fall. Denies any vision problems, CP, SOB, dizziness or being light headed. Denies any blood thinner usage. Patient stopped taking daily ASA 3 months ago.   Hospital course:  CTH: no hemorrhage  Initial NIHSS: 4   INTERVAL HISTORY NIHSS 0, pt reports she is back to baseline today. Stroke wk up underway.  I have personally reviewed history of presenting illness with the patient, electronic medical records and imaging films in PACS    OBJECTIVE Vitals:   06/26/19 0900 06/26/19 0930 06/26/19 1000 06/26/19 1100  BP: 97/67 119/85 106/70 122/62  Pulse: (!) 55 61 64 (!) 56  Resp: (!) 9 10 10 12   Temp:      TempSrc:      SpO2: 94% 94% 95% 96%  Weight:      Height:        CBC:  Recent Labs  Lab 06/25/19 1253 06/25/19 1321  WBC 8.1  --   NEUTROABS 4.2  --   HGB 14.8 14.3  HCT 43.7 42.0  MCV 96.0  --   PLT 261  --     Basic Metabolic Panel:  Recent Labs  Lab 06/25/19 1253 06/25/19 1321  NA 138 138  K 3.2* 3.7  CL 105 103  CO2 21*  --   GLUCOSE 101* 96  BUN 17 16  CREATININE 0.73 0.70  CALCIUM 9.1  --     Lipid Panel:     Component Value Date/Time   CHOL 144 06/26/2019 0701   TRIG 99 06/26/2019 0701   HDL 32 (L) 06/26/2019 0701   CHOLHDL 4.5 06/26/2019 0701   VLDL 20 06/26/2019 0701   LDLCALC 92 06/26/2019 0701   HgbA1c:  Lab Results   Component Value Date   HGBA1C 5.6 06/26/2019   Urine Drug Screen: No results found for: LABOPIA, COCAINSCRNUR, LABBENZ, AMPHETMU, THCU, LABBARB  Alcohol Level No results found for: ETH  IMAGING   Ct Head Code Stroke Wo Contrast  Result Date: 06/25/2019 CLINICAL DATA:  Code stroke. Left-sided numbness and weakness. Rule out stroke. EXAM: CT HEAD WITHOUT CONTRAST TECHNIQUE: Contiguous axial images were obtained from the base of the skull through the vertex without intravenous contrast. COMPARISON:  None. FINDINGS: Brain: No evidence of acute infarction, hemorrhage, hydrocephalus, extra-axial collection or mass lesion/mass effect. Vascular: Negative for hyperdense vessel Skull: Negative Sinuses/Orbits: Negative Other: None ASPECTS ([place] [facility] Stroke Program Early CT Score) - Ganglionic level infarction (caudate, lentiform nuclei, internal capsule, insula, M1-M3 cortex): 7 - Supraganglionic infarction (M4-M6 cortex): 3 Total score (0-10 with 10 being normal): 10 IMPRESSION: 1. No acute intracranial abnormality 2. ASPECTS is 10 3. These results were called by telephone at the time of interpretation on 06/25/2019 at 1:03 pm to provider [facility] , who verbally acknowledged these results. Electronically Signed  By: Marlan Palau M.D.   On: 06/25/2019 13:04     Transthoracic Echocardiogram  Pending No results found for this or any previous visit (from the past 04540 hour(s)).   ECG - SR rate 64 BPM. (See cardiology reading for complete details)   PHYSICAL EXAM Blood pressure 122/62, pulse (!) 56, temperature 97.7 F (36.5 C), temperature source Oral, resp. rate 12, height  (1.6 m), weight 51 kg, SpO2 96 %. Pleasant middle-aged Caucasian lady not in distress. HEENT-  Normocephalic, no lesions, without obvious abnormality.  Normal external eye and conjunctiva. Cardiovascular-  pulses palpable throughout  Lungs no excessive working breathing.  Saturations within normal limits on  RA Extremities- Warm, dry and intact Musculoskeletal-no joint tenderness, deformity or swelling Skin-warm and dry, no hyperpigmentation, or suspicious lesions  Neurological Examination Mental Status: Alert, oriented name/age/month/year/city, thought content appropriate.  Speech fluent without evidence of aphasia.  No dysarthria. naming intact. Able to follow commands without difficulty. Cranial Nerves: II: Visual fields grossly normal,  III,IV, VI: ptosis not present, extra-ocular motions intact bilaterally, pupils equal, round, reactive to light and accommodation V,VII: smile symmetric, facial light touch sensation normal bilaterally VIII: hearing normal bilaterally IX,X: uvula rises midline XI: bilateral shoulder shrug XII: midline tongue extension Motor: Right :  Upper extremity   5/5              Left:     Upper extremity   5/5             Lower extremity   5/5                          Lower extremity   5/5 Tone and bulk:normal tone throughout; no atrophy noted Sensory:  light touch intact throughout, bilaterally Deep Tendon Reflexes: 2+ and symmetric biceps and patellae Plantars: Right: downgoing                                Left: downgoing Cerebellar: normal finger-to-nose,and normal heel-to-shin test Gait: deferred NIHSS= 0   HOME MEDICATIONS:  Medications Prior to Admission  Medication Sig Dispense Refill  . albuterol (VENTOLIN HFA) 108 (90 Base) MCG/ACT inhaler Inhale 2 puffs into the lungs at bedtime.    . ALPRAZolam (XANAX) 1 MG tablet Take 1 mg by mouth daily as needed for anxiety or sleep.     Marland Kitchen aspirin EC 81 MG EC tablet Take 1 tablet (81 mg total) by mouth daily.    Marland Kitchen atorvastatin (LIPITOR) 20 MG tablet TAKE 1 TABLET BY MOUTH EVERY DAY (Patient not taking: Reported on 12/03/2017) 30 tablet 0  . b complex vitamins tablet Take 1 tablet by mouth daily.    . isosorbide mononitrate (IMDUR) 30 MG 24 hr tablet Take 1 tablet (30 mg total) by mouth daily. NEED OV. 30  tablet 0  . mirtazapine (REMERON) 30 MG tablet Take 30 mg by mouth at bedtime.    . nitroGLYCERIN (NITROSTAT) 0.4 MG SL tablet PLACE AND DISSOLVE 1 TABLET UNDER THE TONGUE EVERY 5 MINUTES AS NEEDED FOR CHEST PAIN. MAY TAKE UP TO 3 DOSES. IF NO RELIEF CALL 911 25 tablet 0  . nitroGLYCERIN (NITROSTAT) 0.4 MG SL tablet PLACE 1 TABLET UNDER THE TONGUE EVERY 5 MINUTES AS NEEDED FOR CHEST PAIN. MAY TAKE UP TO 3 DOSES. IF NO RELIEF CALL 911 25 tablet 0  . Omega-3 Fatty Acids (FISH OIL) 1200  MG CAPS Take 1 capsule by mouth daily.    . ondansetron (ZOFRAN) 4 mg TABS tablet Take 4 tablets by mouth every 8 (eight) hours as needed. (Patient not taking: Reported on 12/03/2017) 4 tablet 0  . oxycodone (ROXICODONE) 30 MG immediate release tablet Take 20 mg by mouth every 4 (four) hours as needed (for pain).    . Probiotic Product (PROBIOTIC DAILY PO) Take 1 tablet by mouth every evening.    Marland Kitchen. rOPINIRole (REQUIP) 1 MG tablet Take 1 mg by mouth at bedtime.    . rosuvastatin (CRESTOR) 20 MG tablet Take 20 mg by mouth at bedtime.        HOSPITAL MEDICATIONS:  . atorvastatin  40 mg Oral q1800  . Chlorhexidine Gluconate Cloth  6 each Topical Daily  . influenza vac split quadrivalent PF  0.5 mL Intramuscular Tomorrow-1000  . labetalol  20 mg Intravenous Once  . mirtazapine  30 mg Oral QHS  . pantoprazole (PROTONIX) IV  40 mg Intravenous QHS  . rOPINIRole  1 mg Oral QHS  . sodium chloride flush  3 mL Intravenous Once    ALLERGIES Allergies  Allergen Reactions  . Mirabegron Hypertension   ASSESSMENT/PLAN Katelyn Smith is a 53 y.o. female with history of of tobacco use (smokes 1/2 pack per day) and HLD who presented [facility] for c/c of left arm numbness and left leg weakness. Code stroke was initiated at [facility]. CTH revealed no hemorrhage. Initial NIHSS was 4; she was seen by tele neurology and tPA was administered. [facility]. Stroke Risk Factors - family history, hyperlipidemia  and smoking   Right hemispheric subcortical infarct s/p IV TPA treatment with excellent clinical recovery etiology possibly small vessel disease  Resultant  Left arm/leg numbness  Code Stroke CT Head - neg.   ASPECTS10  MRI head small acute right thalamic infarct.  MRA brain Motion degraded but no large vessel stenosis  CTA H&N: Done at outside hospital.  Reports not available.    CT Perfusion: not avail at outside hospital  2D Echo - pending  Loyal JacobsonSars Corona Virus 2  neg  LDL - 92, goal is <70    Component Value Date/Time   LDLCALC 92 06/26/2019 0701     HgbA1c - 5.6  UDS not done  VTE prophylaxis - start Lovenox 24h post IV tPA  Diet  Diet Order            Diet Heart Room service appropriate? Yes; Fluid consistency: Thin  Diet effective now               Not on any antiplt prior to admission, now on ASA post tPA  Patient counseled to be compliant with her antithrombotic medications  Ongoing aggressive stroke risk factor management  Therapy recommendations: homel; no rehab needs identified   Disposition:  Home in next 24h  Hypertension  Home BP meds: none  Current BP meds: none  Stable, no prior hx of HTN . Long-term BP goal normotensive  Hyperlipidemia  Home Lipid lowering medication: none now, but previously was on Lipitor 20 in past  LDL 92, goal < 70  Current lipid lowering medication: Lipitor 40 mg daily  Continue statin at discharge  Diabetes  No prior hx of DM   HgbA1c 5.6, goal < 7.0 Recent Labs    06/25/19 1312  GLUCAP 97     Other Stroke Risk Factors  Cigarette smoker; advised to stop smoking  Family hx stroke    Hospital day # 1  Transfer out of ICU today  Start ASA and Lovenox if MRI neg  Desiree Metzger-Cihelka, ARNP-C, ANVP-BC Pager: [phone] I have personally obtained history,examined this patient, reviewed notes, independently viewed imaging studies, participated in medical decision making and plan of  care.ROS completed by me personally and pertinent positives fully documented  I have made any additions or clarifications directly to the above note.  She presented with sudden onset of left hand and face paresthesias and weakness likely due to small right subcortical infarct and received IV TPA with excellent clinical recovery.  Recommend close neurological monitoring and strict blood pressure control as per TPA protocol.  Mobilize out of bed.  Therapy consults.  Check MRI scan of the brain later today as well as echocardiogram, lipid profile hemoglobin A1c.  Will start antiplatelet therapy 24 hours after TPA administration. This patient is critically ill and at significant risk of neurological worsening, death and care requires constant monitoring of vital signs, hemodynamics,respiratory and cardiac monitoring, extensive review of multiple databases, frequent neurological assessment, discussion with family, other specialists and medical decision making of high complexity.I have made any additions or clarifications directly to the above note.This critical care time does not reflect procedure time, or teaching time or supervisory time of PA/NP/Med Resident etc but could involve care discussion time.  Patient counseled to quit smoking completely and she is agreeable to do so  I spent 30 minutes of neurocritical care time  in the care of  this patient.      Delia Heady, MD Medical Director [facility] Pager: [phone] 06/26/2019 4:58 PM To contact Stroke Continuity provider, please refer to [email or web address]. After hours, contact General Neurology

## 2019-06-26 NOTE — Progress Notes (Signed)
PT Cancellation Note  Patient Details Name: Katelyn Smith MRN: [id number] DOB: [birth year]   Cancelled Treatment:    Reason Eval/Treat Not Completed: PT screened, no needs identified, will sign off  Patient seen by OT and observed up in room and in hall. She reports she is back to baseline (OT agreed no evidence of imbalance or gait abnormalities).     Barry Brunner, PT     [name] 06/26/2019, 10:21 AM

## 2019-06-27 DIAGNOSIS — F17200 Nicotine dependence, unspecified, uncomplicated: Secondary | ICD-10-CM

## 2019-06-27 DIAGNOSIS — E785 Hyperlipidemia, unspecified: Secondary | ICD-10-CM

## 2019-06-27 DIAGNOSIS — I679 Cerebrovascular disease, unspecified: Secondary | ICD-10-CM

## 2019-06-27 MED ORDER — CLOPIDOGREL BISULFATE 75 MG PO TABS
75.0000 mg | ORAL_TABLET | Freq: Every day | ORAL | Status: DC
  Administered 2019-06-27: 75 mg via ORAL
  Filled 2019-06-27: qty 1

## 2019-06-27 MED ORDER — ATORVASTATIN CALCIUM 40 MG PO TABS: 40.0000 mg | ORAL_TABLET | Freq: Every day | ORAL | 3 refills | Status: DC

## 2019-06-27 MED ORDER — PANTOPRAZOLE SODIUM 40 MG PO TBEC: 40.0000 mg | DELAYED_RELEASE_TABLET | Freq: Every day | ORAL | Status: DC

## 2019-06-27 MED ORDER — CLOPIDOGREL BISULFATE 75 MG PO TABS: 75.0000 mg | ORAL_TABLET | Freq: Every day | ORAL | 3 refills | Status: DC

## 2019-06-27 NOTE — Discharge Instructions (Signed)
Take ASA + Plavix for 3 weeks, then stop ASA and take Plavix alone.

## 2019-06-27 NOTE — Progress Notes (Signed)
SLP Cancellation Note  Patient Details Name: Katelyn Smith MRN: [id number] DOB: [birth year]   Cancelled treatment:       Reason Eval/Treat Not Completed: SLP screened, no needs identified, will sign off. Chart reviewed and history taken. Met with pt briefly. Pt appears to be at baseline for speech, language, and cognition. She was encouraged to notify PCP if difficulties arise upon return to normal routines. ST signing off at this time. Please reconsult if needs arise.  [name], [facility], [place] Speech Language Pathologist Office: [phone] Pager: [phone]  Shonna Chock 06/27/2019, 11:30 AM

## 2019-06-27 NOTE — Progress Notes (Addendum)
STROKE TEAM PROGRESS NOTE   HISTORY OF PRESENT ILLNESS (per record) Katelyn Smith is an 53 y.o. female  PMH tobacco use (smokes 1/2 pack per day), HLD who presented to [facility] for c/o left arm and leg numbness, then left leg weakness. NIHSS 4. Code stroke was initiated and patient transferred to [facility] after receiving tPA.   Patient was at work (she is a Designer, fashion/clothing) she first felt numbness in her left fingers while she was cooking. The numbness then traveled up her left arm. While she was cooking she went to take some steps to the waffle iron and her left leg gave away. A co-worker caught her and helped get her to the back. She did not fall. Denies any vision problems, CP, SOB, dizziness or being light headed. Denies any blood thinner usage. Patient stopped taking daily ASA 3 months ago.   Hospital course:  CTH: no hemorrhage  Initial NIHSS: 4   INTERVAL HISTORY NIHSS remains 0. Stroke education done. Reviewed secondary stroke prevention.Work up complete & ready for dc.    OBJECTIVE Vitals:   06/27/19 0103 06/27/19 0352 06/27/19 0849 06/27/19 0900  BP: 117/60 (!) 112/55 121/87 135/72  Pulse: (!) 53 (!) 53 (!) 51 83  Resp: Temp: 98.2 F (36.8 C) (!) 97.5 F (36.4 C) 97.7 F (36.5 C) 98.3 F (36.8 C)  TempSrc: Oral Oral Oral Oral  SpO2: 98% 96% 98% 98%  Weight: 51.9 kg     Height:  (1.6 m)       CBC:  Recent Labs  Lab 06/25/19 1253 06/25/19 1321  WBC 8.1  --   NEUTROABS 4.2  --   HGB 14.8 14.3  HCT 43.7 42.0  MCV 96.0  --   PLT 261  --     Basic Metabolic Panel:  Recent Labs  Lab 06/25/19 1253 06/25/19 1321  NA 138 138  K 3.2* 3.7  CL 105 103  CO2 21*  --   GLUCOSE 101* 96  BUN 17 16  CREATININE 0.73 0.70  CALCIUM 9.1  --     Lipid Panel:     Component Value Date/Time   CHOL 144 06/26/2019 0701   TRIG 99 06/26/2019 0701   HDL 32 (L) 06/26/2019 0701   CHOLHDL 4.5 06/26/2019 0701   VLDL 20 06/26/2019 0701   LDLCALC 92 06/26/2019 0701    HgbA1c:  Lab Results  Component Value Date   HGBA1C 5.6 06/26/2019   Urine Drug Screen: No results found for: LABOPIA, COCAINSCRNUR, LABBENZ, AMPHETMU, THCU, LABBARB  Alcohol Level No results found for: [facility]  IMAGING   Mr Angio Head Wo Contrast  Result Date: 06/26/2019 CLINICAL DATA:  Stroke, follow-up. Additional history provided: Patient presented as code stroke with report of left arm numbness and left leg weakness. EXAM: MRI HEAD WITHOUT CONTRAST MRA HEAD WITHOUT CONTRAST TECHNIQUE: Multiplanar, multiecho pulse sequences of the brain and surrounding structures were obtained without intravenous contrast. Angiographic images of the head were obtained using MRA technique without contrast. COMPARISON:  Noncontrast head CT 06/25/2019 FINDINGS: MRI HEAD FINDINGS Brain: Multiple sequences are motion degraded, limiting evaluation. There is a punctate focus of restricted diffusion within the right thalamus consistent with acute infarct (series 2, image 30). Corresponding T2/FLAIR hyperintensity at this site. No convincing evidence of acute infarct elsewhere within the brain. No evidence of intracranial mass. No midline shift or extra-axial fluid collection. No chronic intracranial blood products. Mild-to-moderate scattered T2/FLAIR hyperintensity within the cerebral  white matter and pons, nonspecific but consistent with chronic small vessel ischemic disease. Cerebral volume is normal for age. Vascular: Reported separately Skull and upper cervical spine: No focal marrow lesion Sinuses/Orbits: Visualized orbits demonstrate no acute abnormality. Trace ethmoid sinus mucosal thickening. No significant mastoid effusion. MRA HEAD FINDINGS The examination is motion degraded, limiting evaluation. The intracranial internal carotid arteries are patent bilaterally without significant stenosis. The bilateral middle and anterior cerebral arteries are patent without significant proximal stenosis. No intracranial  aneurysm is identified. The intracranial vertebral arteries are patent bilaterally without significant stenosis. The basilar artery is patent. Apparent mild focal stenosis within the distal basilar artery, although this may be artifactual and related to motion artifact. The bilateral posterior cerebral arteries are patent without significant proximal stenosis. IMPRESSION: MRI brain: 1. Motion degraded examination. 2. Punctate acute infarct within the right thalamus. 3. Mild-to-moderate chronic small vessel ischemic disease. MRA head: 1. Motion degraded exam. 2. No intracranial large vessel occlusion or proximal high-grade arterial stenosis. 3. Apparent mild focal stenosis within the distal basilar artery. However, this may be artifactual and related to motion degradation. Electronically Signed   By: Jackey Loge DO   On: 06/26/2019 12:29   Mr Brain Wo Contrast  Result Date: 06/26/2019 CLINICAL DATA:  Stroke, follow-up. Additional history provided: Patient presented as code stroke with report of left arm numbness and left leg weakness. EXAM: MRI HEAD WITHOUT CONTRAST MRA HEAD WITHOUT CONTRAST TECHNIQUE: Multiplanar, multiecho pulse sequences of the brain and surrounding structures were obtained without intravenous contrast. Angiographic images of the head were obtained using MRA technique without contrast. COMPARISON:  Noncontrast head CT 06/25/2019 FINDINGS: MRI HEAD FINDINGS Brain: Multiple sequences are motion degraded, limiting evaluation. There is a punctate focus of restricted diffusion within the right thalamus consistent with acute infarct (series 2, image 30). Corresponding T2/FLAIR hyperintensity at this site. No convincing evidence of acute infarct elsewhere within the brain. No evidence of intracranial mass. No midline shift or extra-axial fluid collection. No chronic intracranial blood products. Mild-to-moderate scattered T2/FLAIR hyperintensity within the cerebral white matter and pons, nonspecific  but consistent with chronic small vessel ischemic disease. Cerebral volume is normal for age. Vascular: Reported separately Skull and upper cervical spine: No focal marrow lesion Sinuses/Orbits: Visualized orbits demonstrate no acute abnormality. Trace ethmoid sinus mucosal thickening. No significant mastoid effusion. MRA HEAD FINDINGS The examination is motion degraded, limiting evaluation. The intracranial internal carotid arteries are patent bilaterally without significant stenosis. The bilateral middle and anterior cerebral arteries are patent without significant proximal stenosis. No intracranial aneurysm is identified. The intracranial vertebral arteries are patent bilaterally without significant stenosis. The basilar artery is patent. Apparent mild focal stenosis within the distal basilar artery, although this may be artifactual and related to motion artifact. The bilateral posterior cerebral arteries are patent without significant proximal stenosis. IMPRESSION: MRI brain: 1. Motion degraded examination. 2. Punctate acute infarct within the right thalamus. 3. Mild-to-moderate chronic small vessel ischemic disease. MRA head: 1. Motion degraded exam. 2. No intracranial large vessel occlusion or proximal high-grade arterial stenosis. 3. Apparent mild focal stenosis within the distal basilar artery. However, this may be artifactual and related to motion degradation. Electronically Signed   By: Jackey Loge DO   On: 06/26/2019 12:29   Ct Head Code Stroke Wo Contrast  Result Date: 06/25/2019 CLINICAL DATA:  Code stroke. Left-sided numbness and weakness. Rule out stroke. EXAM: CT HEAD WITHOUT CONTRAST TECHNIQUE: Contiguous axial images were obtained from the base of the skull through  the vertex without intravenous contrast. COMPARISON:  None. FINDINGS: Brain: No evidence of acute infarction, hemorrhage, hydrocephalus, extra-axial collection or mass lesion/mass effect. Vascular: Negative for hyperdense vessel  Skull: Negative Sinuses/Orbits: Negative Other: None ASPECTS ([place] Stroke Program Early CT Score) - Ganglionic level infarction (caudate, lentiform nuclei, internal capsule, insula, M1-M3 cortex): 7 - Supraganglionic infarction (M4-M6 cortex): 3 Total score (0-10 with 10 being normal): 10 IMPRESSION: 1. No acute intracranial abnormality 2. ASPECTS is 10 3. These results were called by telephone at the time of interpretation on 06/25/2019 at 1:03 pm to provider [facility] , who verbally acknowledged these results. Electronically Signed   By: Franchot Gallo M.D.   On: 06/25/2019 13:04   Carotid (at [place] Only)  Result Date: 06/26/2019 Carotid Arterial Duplex Study Indications:       CVA. Risk Factors:      Hyperlipidemia, coronary artery disease, prior CVA. Comparison Study:  No prior studies. Performing Technologist: Oliver Hum RVT  Examination Guidelines: A complete evaluation includes B-mode imaging, spectral Doppler, color Doppler, and power Doppler as needed of all accessible portions of each vessel. Bilateral testing is considered an integral part of a complete examination. Limited examinations for reoccurring indications may be performed as noted.  Right Carotid Findings: +----------+--------+--------+--------+-----------------------+--------+           PSV cm/sEDV cm/sStenosisPlaque Description     Comments +----------+--------+--------+--------+-----------------------+--------+ CCA Prox  80      22              smooth and heterogenous         +----------+--------+--------+--------+-----------------------+--------+ CCA Distal63      22              smooth and heterogenous         +----------+--------+--------+--------+-----------------------+--------+ ICA Prox  66      17              smooth and heterogenous         +----------+--------+--------+--------+-----------------------+--------+ ICA Distal71      22                                     tortuous  +----------+--------+--------+--------+-----------------------+--------+ ECA       89      22                                              +----------+--------+--------+--------+-----------------------+--------+ +----------+--------+-------+--------+-------------------+           PSV cm/sEDV cmsDescribeArm Pressure (mmHG) +----------+--------+-------+--------+-------------------+ Subclavian116                                        +----------+--------+-------+--------+-------------------+ +---------+--------+--+--------+--+---------+ VertebralPSV cm/s65EDV cm/s20Antegrade +---------+--------+--+--------+--+---------+  Left Carotid Findings: +----------+--------+--------+--------+-----------------------+--------+           PSV cm/sEDV cm/sStenosisPlaque Description     Comments +----------+--------+--------+--------+-----------------------+--------+ CCA Prox  89      29                                              +----------+--------+--------+--------+-----------------------+--------+ CCA Distal72  27              smooth and heterogenous         +----------+--------+--------+--------+-----------------------+--------+ ICA Prox  90      24              smooth and heterogenous         +----------+--------+--------+--------+-----------------------+--------+ ICA Distal67      24                                     tortuous +----------+--------+--------+--------+-----------------------+--------+ ECA       80      17                                              +----------+--------+--------+--------+-----------------------+--------+ +----------+--------+--------+--------+-------------------+           PSV cm/sEDV cm/sDescribeArm Pressure (mmHG) +----------+--------+--------+--------+-------------------+ Subclavian127                                         +----------+--------+--------+--------+-------------------+  +---------+--------+--+--------+--+---------+ VertebralPSV cm/s74EDV cm/s25Antegrade +---------+--------+--+--------+--+---------+  Summary: Right Carotid: Velocities in the right ICA are consistent with a 1-39% stenosis. Left Carotid: Velocities in the left ICA are consistent with a 1-39% stenosis. Vertebrals: Bilateral vertebral arteries demonstrate antegrade flow. *See table(s) above for measurements and observations.  Electronically signed by Delia Heady MD on 06/26/2019 at 3:13:55 PM.    Final      Transthoracic Echocardiogram  Pending Recent Results (from the past 16109 hour(s))  ECHOCARDIOGRAM COMPLETE   Collection Time: 06/26/19  1:31 PM  Result Value   Weight 1,798.95   Height 63   BP 122/62   Narrative     ECHOCARDIOGRAM REPORT       Patient Name:   Katelyn Smith Date of Exam: 06/26/2019 Medical Rec #:  [id number]      Height:       63.0 in Accession #:    [id number]     Weight:       112.4 lb Date of Birth:  [birth year]      BSA:          1.51 m Patient Age:    53 years       BP:           102/66 mmHg Patient Gender: F              HR:           57 bpm. Exam Location:  Inpatient  Procedure: 2D Echo  Indications:    Stroke 434.91/I163.9   History:        Patient has no prior history of Echocardiogram examinations.   Sonographer:    Ross Ludwig RDCS (AE) Referring Phys: [id number] ERIC LINDZEN    Sonographer Comments: Image acquisition challenging due to respiratory motion. IMPRESSIONS    1. Left ventricular ejection fraction, by visual estimation, is 60 to 65%. The left ventricle has normal function. Normal left ventricular size. There is no left ventricular hypertrophy.  2. Global right ventricle has normal systolic function.The right ventricular size is normal. No increase in right ventricular wall thickness.  3. Left atrial size was normal.  4. Right atrial size was normal.  5. The mitral valve is grossly normal. Trace mitral valve regurgitation.  6. The  tricuspid valve is normal in structure. Tricuspid valve regurgitation is trivial.  7. The aortic valve is tricuspid Aortic valve regurgitation was not visualized by color flow Doppler. Mild aortic valve sclerosis without stenosis.  8. The pulmonic valve was not well visualized. Pulmonic valve regurgitation is not visualized by color flow Doppler.  9. The inferior vena cava is dilated in size with <50% respiratory variability, suggesting right atrial pressure of 15 mmHg.  FINDINGS  Left Ventricle: Left ventricular ejection fraction, by visual estimation, is 60 to 65%. The left ventricle has normal function. There is no left ventricular hypertrophy. Normal left ventricular size. Spectral Doppler shows Left ventricular diastolic  parameters were normal pattern of LV diastolic filling.  Right Ventricle: The right ventricular size is normal. No increase in right ventricular wall thickness. Global RV systolic function is has normal systolic function.  Left Atrium: Left atrial size was normal in size.  Right Atrium: Right atrial size was normal in size  Pericardium: There is no evidence of pericardial effusion.  Mitral Valve: The mitral valve is grossly normal. There is mild thickening of the mitral valve leaflet(s). MV peak gradient, 4.2 mmHg. Trace mitral valve regurgitation.  Tricuspid Valve: The tricuspid valve is normal in structure. Tricuspid valve regurgitation is trivial by color flow Doppler.  Aortic Valve: The aortic valve is tricuspid. Aortic valve regurgitation was not visualized by color flow Doppler. Mild aortic valve sclerosis is present, with no evidence of aortic valve stenosis. Aortic valve mean gradient measures 4.0 mmHg. Aortic  valve peak gradient measures 8.9 mmHg. Aortic valve area, by VTI measures 2.22 cm.  Pulmonic Valve: The pulmonic valve was not well visualized. Pulmonic valve regurgitation is not visualized by color flow Doppler.  Aorta: The aortic root is normal in  size and structure.  Venous: The inferior vena cava is dilated in size with less than 50% respiratory variability, suggesting right atrial pressure of 15 mmHg.  IAS/Shunts: No atrial level shunt detected by color flow Doppler.     LEFT VENTRICLE PLAX 2D LVIDd:         3.70 cm  Diastology LVIDs:         2.70 cm  LV e' lateral:   9.68 cm/s LV PW:         1.10 cm  LV E/e' lateral: 11.1 LV IVS:        1.10 cm  LV e' medial:    9.14 cm/s LVOT diam:     2.00 cm  LV E/e' medial:  11.7 LV SV:         31 ml LV SV Index:   20.70 LVOT Area:     3.14 cm    RIGHT VENTRICLE            IVC RV Basal diam:  2.70 cm    IVC diam: 2.30 cm RV S prime:     9.79 cm/s TAPSE (M-mode): 2.2 cm  LEFT ATRIUM             Index       RIGHT ATRIUM           Index LA diam:        2.20 cm 1.45 cm/m  RA Area:     11.60 cm LA Vol (A2C):   24.6 ml 16.25 ml/m RA Volume:   28.50 ml  18.83 ml/m LA Vol (A4C):   17.2 ml 11.36 ml/m  LA Biplane Vol: 21.4 ml 14.14 ml/m  AORTIC VALVE AV Area (Vmax):    2.17 cm AV Area (Vmean):   1.95 cm AV Area (VTI):     2.22 cm AV Vmax:           149.00 cm/s AV Vmean:          95.100 cm/s AV VTI:            0.293 m AV Peak Grad:      8.9 mmHg AV Mean Grad:      4.0 mmHg LVOT Vmax:         103.00 cm/s LVOT Vmean:        58.900 cm/s LVOT VTI:          0.207 m LVOT/AV VTI ratio: 0.71   AORTA Ao Root diam: 2.90 cm  MITRAL VALVE MV Area (PHT): 2.76 cm              SHUNTS MV Peak grad:  4.2 mmHg              Systemic VTI:  0.21 m MV Mean grad:  1.0 mmHg              Systemic Diam: 2.00 cm MV Vmax:       1.03 m/s MV Vmean:      53.7 cm/s MV VTI:        0.37 m MV PHT:        79.75 msec MV Decel Time: 275 msec MV E velocity: 107.00 cm/s 103 cm/s MV A velocity: 60.10 cm/s  70.3 cm/s MV E/A ratio:  1.78        1.5    Dietrich Pates MD Electronically signed by Dietrich Pates MD Signature Date/Time: 06/26/2019/2:00:59 PM       Final     *Note: Due to a large number of  results and/or encounters for the requested time period, some results have not been displayed. A complete set of results can be found in Results Review.     ECG - SR rate 64 BPM. (See cardiology reading for complete details)   PHYSICAL EXAM Blood pressure 135/72, pulse 83, temperature 98.3 F (36.8 C), temperature source Oral, resp. rate 16, height  (1.6 m), weight 51.9 kg, SpO2 98 %. Pleasant middle-aged Caucasian lady not in distress. HEENT-  Normocephalic, no lesions, without obvious abnormality.  Normal external eye and conjunctiva. Cardiovascular-  pulses palpable throughout  Lungs no excessive working breathing.  Saturations within normal limits on RA Extremities- Warm, dry and intact Musculoskeletal-no joint tenderness, deformity or swelling Skin-warm and dry, no hyperpigmentation, or suspicious lesions  Neurological Examination Mental Status: Alert, oriented name/age/month/year/city, thought content appropriate.  Speech fluent without evidence of aphasia.  No dysarthria. naming intact. Able to follow commands without difficulty. Cranial Nerves: II: Visual fields grossly normal,  III,IV, VI: ptosis not present, extra-ocular motions intact bilaterally, pupils equal, round, reactive to light and accommodation V,VII: smile symmetric, facial light touch sensation normal bilaterally VIII: hearing normal bilaterally IX,X: uvula rises midline XI: bilateral shoulder shrug XII: midline tongue extension Motor: Right :  Upper extremity   5/5              Left:     Upper extremity   5/5             Lower extremity   5/5  Lower extremity   5/5 Tone and bulk:normal tone throughout; no atrophy noted Sensory:  light touch intact throughout, bilaterally Deep Tendon Reflexes: 2+ and symmetric biceps and patellae Plantars: Right: downgoing                                Left: downgoing Cerebellar: normal finger-to-nose,and normal heel-to-shin test Gait:  deferred NIHSS= 0   HOME MEDICATIONS:  Medications Prior to Admission  Medication Sig Dispense Refill  . nitroGLYCERIN (NITROSTAT) 0.4 MG SL tablet PLACE AND DISSOLVE 1 TABLET UNDER THE TONGUE EVERY 5 MINUTES AS NEEDED FOR CHEST PAIN. MAY TAKE UP TO 3 DOSES. IF NO RELIEF CALL 911 (Patient taking differently: Place 0.4 mg under the tongue every 5 (five) minutes as needed for chest pain. ) 25 tablet 0  . Oxycodone HCl 10 MG TABS Take 10 mg by mouth every 4 (four) hours as needed for severe pain.    Marland Kitchen aspirin EC 81 MG EC tablet Take 1 tablet (81 mg total) by mouth daily. (Patient not taking: Reported on 06/26/2019)    . atorvastatin (LIPITOR) 20 MG tablet TAKE 1 TABLET BY MOUTH EVERY DAY (Patient not taking: Reported on 12/03/2017) 30 tablet 0  . isosorbide mononitrate (IMDUR) 30 MG 24 hr tablet Take 1 tablet (30 mg total) by mouth daily. NEED OV. (Patient not taking: Reported on 06/26/2019) 30 tablet 0      HOSPITAL MEDICATIONS:  . aspirin EC  81 mg Oral Daily  . atorvastatin  40 mg Oral q1800  . Chlorhexidine Gluconate Cloth  6 each Topical Daily  . influenza vac split quadrivalent PF  0.5 mL Intramuscular Tomorrow-1000  . labetalol  20 mg Intravenous Once  . mirtazapine  30 mg Oral QHS  . pantoprazole  40 mg Oral QHS  . sodium chloride flush  3 mL Intravenous Once    ALLERGIES Allergies  Allergen Reactions  . Mirabegron Hypertension   ASSESSMENT/PLAN Katelyn Smith is a 54 y.o. female with history of of tobacco use (smokes 1/2 pack per day) and HLD who presented to [facility] for c/c of left arm numbness and left leg weakness. Code stroke was initiated at [facility]. CTH revealed no hemorrhage. Initial NIHSS was 4; she was seen by tele neurology and tPA was administered. Patient transferred to [facility] for post tPA care. Stroke Risk Factors - family history, hyperlipidemia and smoking   Right hemispheric subcortical infarct s/p IV TPA treatment with excellent clinical recovery etiology possibly small  vessel disease  Resultant  Left arm/leg numbness  Code Stroke CT Head - neg.   ASPECTS10  MRI head small acute right thalamic infarct.  MRA brain Motion degraded but no large vessel stenosis  CTA H&N: Done at outside hospital.  Reports not available.    CT Perfusion: not avail at outside hospital  2D Echo - EF 60%, no thrombus  Loyal Jacobson Virus 2  neg  LDL - 92, goal is <70    Component Value Date/Time   LDLCALC 92 06/26/2019 0701    HgbA1c - 5.6  UDS not done  VTE prophylaxis - start Lovenox 24h post IV tPA  Diet  Diet Order            Diet Heart Room service appropriate? Yes; Fluid consistency: Thin  Diet effective now              Not on any antiplt prior to admission, now on  ASA + Plavix post tPA  Patient counseled to be compliant with her antithrombotic medications  Ongoing aggressive stroke risk factor management  Therapy recommendations: homel; no rehab needs identified   Disposition:  Home in next 24h  Hypertension  Home BP meds: none  Current BP meds: none  Stable, no prior hx of HTN . Long-term BP goal normotensive  Hyperlipidemia  Home Lipid lowering medication: none now, but previously was on Lipitor 20 in past  LDL 92, goal < 70  Current lipid lowering medication: Lipitor 40 mg daily  Continue statin at discharge  Diabetes  No prior hx of DM   HgbA1c 5.6, goal < 7.0 Recent Labs    06/25/19 1312  GLUCAP 97    Other Stroke Risk Factors  Cigarette smoker; advised to stop smoking  Family hx stroke    Hospital day # 2  Transfer out of ICU today  Start ASA and Lovenox if MRI neg  Desiree Metzger-Cihelka, ARNP-C, ANVP-BC Pager: [phone] I have personally obtained history,examined this patient, reviewed notes, independently viewed imaging studies, participated in medical decision making and plan of care.ROS completed by me personally and pertinent positives fully documented  I have made any additions or  clarifications directly to the above note.  She presented with sudden onset of left hand and face paresthesias and weakness likely due to small right subcortical infarct and received IV TPA with excellent clinical recovery.   MRI shows a small right thalamic infarct from small vessel disease.  Patient will be discharged home today with aggressive risk factor modification.  Follow-up as an outpatient stroke clinic in 6 weeks.     Delia Heady, MD Medical Director [facility] Pager: [phone] 06/27/2019 10:23 AM To contact Stroke Continuity provider, please refer to [email or web address]. After hours, contact General Neurology

## 2019-06-27 NOTE — TOC Transition Note (Signed)
Transition of Care [facility]) - CM/SW Discharge Note   Patient Details  Name: Katelyn Smith MRN: [id number] Date of Birth: 12/04/1965  [facility] CM/SW Contact:  Pollie Friar, RN Phone Number: 06/27/2019, 11:00 AM   Clinical Narrative:    Pt doesn't have insurance but does see Dr Gerarda Fraction as her PCP. She is asking that her d/c medications be sent to [facility]. MD to print prescriptions. CM provided her Good Rx card for assistance. She feels she can afford the meds with the card.  Pt has transportation home.   Final next level of care: Home/Self Care Barriers to Discharge: Inadequate or no insurance, Barriers Unresolved (comment)   Patient Goals and CMS Choice        Discharge Placement                       Discharge Plan and Services                                     Social Determinants of Health (SDOH) Interventions     Readmission Risk Interventions No flowsheet data found.

## 2019-06-27 NOTE — Discharge Summary (Addendum)
Physician Discharge Summary  Patient ID: Katelyn Smith MRN: [id number] DOB/AGE: 54-05-1966 54 y.o.  Admit date: 06/25/2019 Discharge date: 06/27/2019  Admission Diagnoses: stroke  Discharge Diagnoses:  Active Problems:   Acute ischemic stroke (HCC) Right thalamic infarct due to small vessel disease s/p IV TPA    Stroke (cerebrum) [facility])   Discharged Condition: stable  Hospital Course: Katelyn L Carteris a 53 y.o.femalePMH tobacco use (smokes 1/2 pack per day), HLD who presented to [facility] for c/o left arm and leg numbness, then left leg weakness. NIHSS 4. Code stroke was initiated and patient transferred to [facility] after receivingtPA.   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 The numbness then traveled up her left arm. While she was cooking she went to takesome steps to the waffle iron and her left leg gave away. A co-worker caught her and helped get her to the back. She did not fall. Denies any vision problems, CP, SOB, dizziness orbeing light headed. Denies any blood thinner usage. Patient stopped taking daily ASA 3 months ago. She has now been placed on DAPT x3wk, then continue with monotherapy Plavix only. She was started on high intensity statin as well and instructed on stopping all tobacco smoking/use.   Consults: none  Significant Diagnostic Studies: MRI, CTA, Echo  Treatments: IV Alteplase  Discharge Exam: Blood pressure 135/72, pulse 83, temperature 98.3 F (36.8 C), temperature source Oral, resp. rate 16, height 5\' 3"  (1.6 m), weight 51.9 kg, SpO2 98 %.  HEENT- Normocephalic, no lesions, without obvious abnormality. Normal external eye and conjunctiva. Cardiovascular- pulses palpable throughout  Lungs no excessive working breathing. Saturations within normal limits on RA Extremities- Warm, dry and intact Musculoskeletal-no joint tenderness, deformity or swelling Skin-warm and dry, no  hyperpigmentation, or suspicious lesions  Neurological Examination Mental Status: Alert, orientedname/age/month/year/city, thought content appropriate. Speech fluent without evidence of aphasia. No dysarthria. naming intact.Able to follow commands without difficulty. Cranial Nerves: II: Visual fields grossly normal,  III,IV, VI: ptosis not present, extra-ocular motions intact bilaterally, pupils equal, round, reactive to light and accommodation V,VII: smile symmetric, facial light touch sensation normal bilaterally VIII: hearing normal bilaterally IX,X: uvula rises midline XI: bilateral shoulder shrug XII: midline tongue extension Motor: Right :Upper extremity 5/5Left: Upper extremity 5/5 Lower extremity 5/5Lower extremity 5/5 Tone and bulk:normal tone throughout; no atrophy noted Sensory: light touch intact throughout, bilaterally Deep Tendon Reflexes: 2+ and symmetric biceps and patellae Plantars: Right: downgoingLeft: downgoing Cerebellar: normal finger-to-nose,and normal heel-to-shin test Gait:deferred NIHSS= 0  Disposition: Discharge disposition: 01-Home or Self Care        Allergies as of 06/27/2019      Reactions   Mirabegron Hypertension      Medication List    STOP taking these medications   isosorbide mononitrate 30 MG 24 hr tablet Commonly known as: IMDUR   nitroGLYCERIN 0.4 MG SL tablet Commonly known as: NITROSTAT     TAKE these medications   aspirin 81 MG EC tablet Take 1 tablet (81 mg total) by mouth daily.   atorvastatin 40 MG tablet Commonly known as: LIPITOR Take 1 tablet (40 mg total) by mouth daily at 6 PM. What changed:   medication strength  how much to take  when to take this   clopidogrel 75 MG tablet Commonly known as: PLAVIX Take 1 tablet (75 mg total) by mouth daily.   Oxycodone HCl 10 MG Tabs Take 10 mg by mouth  every  4 (four) hours as needed for severe pain.      Follow-up Information    [facility]. Schedule an appointment as soon as possible for a visit in 6 week(s).   Specialty: Neurology Contact information: [address] [place] [address] [phone]       Elfredia Nevins, MD. Schedule an appointment as soon as possible for a visit in 7 day(s).   Specialty: Internal Medicine Contact information: [address] [place] [address] [phone]         Time taken for discharge summary preparation 35 minutes  Signed: Desiree Metzger-Cihelka 06/27/2019, 10:47 AM I have personally obtained history,examined this patient, reviewed notes, independently viewed imaging studies, participated in medical decision making and plan of care.ROS completed by me personally and pertinent positives fully documented  I have made any additions or clarifications directly to the above note. Agree with note above.    Delia Heady, MD Medical Director [facility] Pager: [phone] 06/27/2019 4:51 PM

## 2019-10-30 DIAGNOSIS — I639 Cerebral infarction, unspecified: Secondary | ICD-10-CM

## 2019-10-30 HISTORY — DX: Cerebral infarction, unspecified: I63.9

## 2019-12-17 NOTE — Progress Notes (Unsigned)
{Choose 1 Note Type (Telehealth Visit or Telephone Visit):[phone]}   Date:  12/17/2019   ID:  FRANCETTA ILG, DOB 10-05-1965, MRN [id number]  {Patient Location:[phone]::"Home"} {Provider [phone]::"Home"}  PCP:  Redmond School, MD  Cardiologist: Formerly Dr. Ron Parker Electrophysiologist:  None   Evaluation Performed:  {Choose Visit Type:[phone]::"Follow-Up Visit"}  Chief Complaint:  ***  History of Present Illness:    Katelyn Smith is a 54 y.o. female we are following for ongoing assessment and management of chest pain, with other history to include DJD, tobacco abuse. Anxiety and depression. She has not been seen by our practice since 2017. She had a NM Stress test in 07/2016 which was negative for ischemia. She continued to have chest pain with multiple CVRF and therefore was scheduled for a cardiac cath.   Left Heart Cath revealed normal EF of 55%-65%, Ost RCA to Mid RCA 40%, acute marginal 80%, lat 3 marginal 70% stenosed. Due to no significant CAD no intervention was done. She was to be treated medically with risk factor modification, and advised to stop smoking.   In 06/2019 she was admitted with ischemic CVA (punctate infarct of the right thalamus). She was placed on DAPT for 3 weeks, and then monotherapy with Plavix thereafter. She was to stay on high intensity statin and again counseled on tobacco cessation.   The patient {does/does not:200015} have symptoms concerning for COVID-19 infection (fever, chills, cough, or new shortness of breath).    Past Medical History:  Diagnosis Date  . Anxiety   . Chest pain    a. 07/2016: Stress test showing no evidence of ischemia. b. cath 08/2016: 40% Ost RCA stenosis, 70% Lateral 3rd Mrg, and 80% Acute Mrg (too small for intervention, therefore medical therapy recommended).   . Chronic back pain   . Chronic neck pain   . Depression   . H/O degenerative disc disease   . High cholesterol   . Lumbar radiculopathy, right    . Tobacco abuse    Past Surgical History:  Procedure Laterality Date  . BACK SURGERY    . CARDIAC CATHETERIZATION N/A 09/09/2016   Procedure: Left Heart Cath and Coronary Angiography;  Surgeon: Peter M Martinique, MD;  Location: [facility];  Service: Cardiovascular;  Laterality: N/A;  . CARDIAC CATHETERIZATION  09/09/2016  . TUBAL LIGATION       No outpatient medications have been marked as taking for the 12/18/19 encounter (Appointment) with Lendon Colonel, NP.     Allergies:   Mirabegron   Social History   Tobacco Use  . Smoking status: Current Every Day Smoker    Packs/day: 0.50    Years: 30.00    Pack years: 15.00    Types: Cigarettes  . Smokeless tobacco: Never Used  . Tobacco comment: Since age 32  Substance Use Topics  . Alcohol use: No  . Drug use: No     Family Hx: The patient's family history includes Cancer in her father and sister; Heart disease in an other family member; Heart disease (age of onset: 67) in her mother; Heart failure in her mother; Stroke in her maternal grandmother and another family member.  ROS:   Please see the history of present illness.    *** All other systems reviewed and are negative.   Prior CV studies:   The following studies were reviewed today:  LHC 09/09/2016    The left ventricular systolic function is normal.  LV end diastolic pressure is normal.  The left  ventricular ejection fraction is 55-65% by visual estimate.  Ost RCA to Mid RCA lesion, 40 %stenosed.  Acute Mrg lesion, 80 %stenosed.  Lat 3rd Mrg lesion, 70 %stenosed.   1. No significant obstructive CAD. There is an 80% stenosis in a small RV marginal branch. There is systolic compression of a small distal branch of the OM. 2. Normal LV function 3. Normal LVEDP  Labs/Other Tests and Data Reviewed:    EKG:  {EKG/Telemetry Strips Reviewed:[id number]}  Recent Labs: 06/25/2019: ALT 13; BUN 16; Creatinine, Ser 0.70; Hemoglobin 14.3; Platelets 261;  Potassium 3.7; Sodium 138   Recent Lipid Panel Lab Results  Component Value Date/Time   CHOL 144 06/26/2019 07:01 AM   TRIG 99 06/26/2019 07:01 AM   HDL 32 (L) 06/26/2019 07:01 AM   CHOLHDL 4.5 06/26/2019 07:01 AM   LDLCALC 92 06/26/2019 07:01 AM    Wt Readings from Last 3 Encounters:  06/27/19 114 lb 6.7 oz (51.9 kg)  04/18/18 112 lb (50.8 kg)  12/03/17 119 lb (54 kg)     Objective:    Vital Signs:  There were no vitals taken for this visit.   {HeartCare Virtual Exam (Optional):[phone]::"VITAL SIGNS:  reviewed"}  ASSESSMENT & PLAN:    1. ***  COVID-19 Education: The signs and symptoms of COVID-19 were discussed with the patient and how to seek care for testing (follow up with PCP or arrange E-visit).  ***The importance of social distancing was discussed today.  Time:   Today, I have spent *** minutes with the patient with telehealth technology discussing the above problems.     Medication Adjustments/Labs and Tests Ordered: Current medicines are reviewed at length with the patient today.  Concerns regarding medicines are outlined above.   Tests Ordered: No orders of the defined types were placed in this encounter.   Medication Changes: No orders of the defined types were placed in this encounter.   Disposition:  Follow up {follow up:15908}  Signed, Bettey Mare. Liborio Nixon, ANP, [facility]  12/17/2019 7:58 AM    [facility]

## 2019-12-18 ENCOUNTER — Telehealth: Payer: Self-pay | Admitting: Adult Health

## 2020-03-11 ENCOUNTER — Encounter (HOSPITAL_COMMUNITY): Payer: Self-pay | Admitting: Emergency Medicine

## 2020-03-11 ENCOUNTER — Emergency Department (HOSPITAL_COMMUNITY): Payer: 59

## 2020-03-11 ENCOUNTER — Emergency Department (HOSPITAL_COMMUNITY)
Admission: EM | Admit: 2020-03-11 | Discharge: 2020-03-11 | Disposition: A | Discharge status: Home / Self Care | Payer: 59 | Attending: Emergency Medicine | Admitting: Emergency Medicine

## 2020-03-11 ENCOUNTER — Other Ambulatory Visit: Payer: Self-pay

## 2020-03-11 DIAGNOSIS — Y939 Activity, unspecified: Secondary | ICD-10-CM | POA: Diagnosis not present

## 2020-03-11 DIAGNOSIS — Y929 Unspecified place or not applicable: Secondary | ICD-10-CM | POA: Insufficient documentation

## 2020-03-11 DIAGNOSIS — Z7902 Long term (current) use of antithrombotics/antiplatelets: Secondary | ICD-10-CM | POA: Insufficient documentation

## 2020-03-11 DIAGNOSIS — Y999 Unspecified external cause status: Secondary | ICD-10-CM | POA: Insufficient documentation

## 2020-03-11 DIAGNOSIS — S99911A Unspecified injury of right ankle, initial encounter: Secondary | ICD-10-CM | POA: Diagnosis present

## 2020-03-11 DIAGNOSIS — W1842XA Slipping, tripping and stumbling without falling due to stepping into hole or opening, initial encounter: Secondary | ICD-10-CM | POA: Insufficient documentation

## 2020-03-11 DIAGNOSIS — T1490XA Injury, unspecified, initial encounter: Secondary | ICD-10-CM

## 2020-03-11 DIAGNOSIS — S82891A Other fracture of right lower leg, initial encounter for closed fracture: Secondary | ICD-10-CM

## 2020-03-11 DIAGNOSIS — Z79899 Other long term (current) drug therapy: Secondary | ICD-10-CM | POA: Diagnosis not present

## 2020-03-11 DIAGNOSIS — F17210 Nicotine dependence, cigarettes, uncomplicated: Secondary | ICD-10-CM | POA: Diagnosis not present

## 2020-03-11 DIAGNOSIS — S92154A Nondisplaced avulsion fracture (chip fracture) of right talus, initial encounter for closed fracture: Secondary | ICD-10-CM | POA: Diagnosis not present

## 2020-03-11 NOTE — ED Provider Notes (Signed)
[facility] EMERGENCY DEPARTMENT Provider Note   CSN: [id number] Arrival date & time: 03/11/20  1738     History Chief Complaint  Patient presents with  . Foot Injury    Katelyn Smith is a 54 y.o. female.  HPI       Katelyn Smith is a 54 y.o. female who presents to the Emergency Department complaining of right foot and ankle pain and swelling for 2 days.  She describes a twisting injury that occurred when she stepped in a hole.  Her right knee twisted as well, but she states pain is minimal.  She noted swelling of the foot and ankle and pain associated with weight bearing.  She denies head injury, LOC, neck or back pain, numbness or weakness of the extremity.   Past Medical History:  Diagnosis Date  . Anxiety   . Chest pain    a. 07/2016: Stress test showing no evidence of ischemia. b. cath 08/2016: 40% Ost RCA stenosis, 70% Lateral 3rd Mrg, and 80% Acute Mrg (too small for intervention, therefore medical therapy recommended).   . Chronic back pain   . Chronic neck pain   . Depression   . H/O degenerative disc disease   . High cholesterol   . Lumbar radiculopathy, right   . Stroke (HCC) 10/2019  . Tobacco abuse     Patient Active Problem List   Diagnosis Date Noted  . Small vessel disease, cerebrovascular   . Heavy tobacco smoker   . Hyperlipidemia LDL goal <70   . Acute ischemic stroke (HCC) 06/25/2019  . Stroke (cerebrum) (HCC) 06/25/2019  . CAD (coronary artery disease) 09/24/2016  . Unstable angina (HCC)   . Chest pain 08/10/2016  . Hyperlipidemia 08/10/2016  . Chronic pain syndrome 08/10/2016  . Tobacco abuse 08/10/2016  . Hypokalemia 08/10/2016  . Urge incontinence 12/15/2012  . Chronic low back pain 12/15/2012    Past Surgical History:  Procedure Laterality Date  . BACK SURGERY    . CARDIAC CATHETERIZATION N/A 09/09/2016   Procedure: Left Heart Cath and Coronary Angiography;  Surgeon: Peter M Swaziland, MD;  Location: [facility] INVASIVE CV LAB;  Service:  Cardiovascular;  Laterality: N/A;  . CARDIAC CATHETERIZATION  09/09/2016  . TUBAL LIGATION       OB History    Gravida  3   Para  3   Term  3   Preterm      AB      Living  3     SAB      TAB      Ectopic      Multiple      Live Births              Family History  Problem Relation Age of Onset  . Heart failure Mother   . Heart disease Mother 72       first MI early 78s  . Cancer Father        Died at 23  . Cancer Sister   . Stroke Other   . Heart disease Other   . Stroke Maternal Grandmother     Social History   Tobacco Use  . Smoking status: Current Every Day Smoker    Packs/day: 0.50    Years: 30.00    Pack years: 15.00    Types: Cigarettes  . Smokeless tobacco: Never Used  . Tobacco comment: Since age 27  Vaping Use  . Vaping Use: Some days  Substance Use Topics  .  Alcohol use: No  . Drug use: No    Home Medications Prior to Admission medications   Medication Sig Start Date End Date Taking? Authorizing Provider  atorvastatin (LIPITOR) 40 MG tablet Take 1 tablet (40 mg total) by mouth daily at 6 PM. 06/27/19   Metzger-Cihelka, Cristie Hem, NP  clopidogrel (PLAVIX) 75 MG tablet Take 1 tablet (75 mg total) by mouth daily. 06/27/19   Metzger-Cihelka, Cristie Hem, NP  oxyCODONE (ROXICODONE) 15 MG immediate release tablet Take 15 mg by mouth 4 (four) times daily as needed. 03/08/20   [provider]  Oxycodone HCl 10 MG TABS Take 10 mg by mouth every 4 (four) hours as needed for severe pain. 06/08/19   [provider]    Allergies    Mirabegron  Review of Systems   Review of Systems  Constitutional: Negative for chills and fever.  Eyes: Negative for visual disturbance.  Musculoskeletal: Positive for arthralgias (right foot and ankle pain and swelling) and joint swelling. Negative for back pain and neck pain.  Skin: Negative for color change and wound.  Neurological: Negative for dizziness, syncope, weakness, numbness and headaches.     Physical Exam Updated Vital Signs BP 140/90 (BP Location: Right Arm)   Pulse 90   Temp 98.5 F (36.9 C) (Oral)   Resp 18   Ht 5' 1.5" (1.562 m)   Wt 45.4 kg   SpO2 96%   BMI 18.59 kg/m   Physical Exam Vitals and nursing note reviewed.  Constitutional:      Appearance: Normal appearance. She is not ill-appearing or toxic-appearing.  HENT:     Head: Atraumatic.  Cardiovascular:     Rate and Rhythm: Normal rate and regular rhythm.     Pulses: Normal pulses.  Pulmonary:     Effort: Pulmonary effort is normal.     Breath sounds: Normal breath sounds.  Chest:     Chest wall: No tenderness.  Musculoskeletal:        General: Swelling, tenderness and signs of injury present.     Cervical back: Normal range of motion. No tenderness.     Comments: ttp of the lateral right ankle.  Moderate edema of the dorsal foot and lateral ankle.  Mild ecchymosis to the hind foot.  No proximal tenderness, compartments are soft  Skin:    General: Skin is warm.     Capillary Refill: Capillary refill takes less than 2 seconds.     Findings: No erythema or rash.  Neurological:     General: No focal deficit present.     Mental Status: She is alert.     Sensory: No sensory deficit.     Motor: No weakness.     ED Results / Procedures / Treatments   Labs (all labs ordered are listed, but only abnormal results are displayed) Labs Reviewed - No data to display  EKG None  Radiology DG Ankle Complete Right  Result Date: 03/11/2020 CLINICAL DATA:  Pain EXAM: RIGHT ANKLE - COMPLETE 3+ VIEW COMPARISON:  None. FINDINGS: There is a question of a tiny ossific fragment seen adjacent to the lateral aspect of the calcaneus best seen on the AP views. There is mild overlying soft tissue swelling. There is also soft tissue swelling seen over the medial malleolus. A small ankle joint effusion is seen. There is diffuse osteopenia present. IMPRESSION: Possible tiny ossific fragment seen adjacent to the  lateral calcaneus which could represent a tiny chip fracture. Please correlate with the patient's site of pain.  Electronically Signed   By: Jonna Clark M.D.   On: 03/11/2020 22:29   DG Foot Complete Right  Result Date: 03/11/2020 CLINICAL DATA:  54 year old female with pain radiating from the foot to the knee with swelling. Stepped in a hole 2 days ago. EXAM: RIGHT FOOT COMPLETE - 3+ VIEW COMPARISON:  Right foot series 04/18/2018. FINDINGS: Suspicion of ankle joint effusion on the lateral view. The calcaneus appears stable and intact. Stable small orthopedic screw at the head of the 3rd metatarsal. Tarsal and metatarsal bones appear stable and intact. Stable chronic mild hallux valgus. 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 Possible developing ankylosis of the 5th phalanges. No acute osseous abnormality identified. IMPRESSION: 1. Possible ankle joint effusion. Consider dedicated right ankle series. 2. Bones of the right foot appear stable since 2019. No acute osseous abnormality identified. Electronically Signed   By: Odessa Fleming M.D.   On: 03/11/2020 19:16    Procedures Procedures (including critical care time)  Medications Ordered in ED Medications - No data to display  ED Course  I have reviewed the triage vital signs and the nursing notes.  Pertinent labs & imaging results that were available during my care of the patient were reviewed by me and considered in my medical decision making (see chart for details).    MDM Rules/Calculators/A&P                           Pt with twisting injury of right ankle.  XR shows chip fx of the lateral calcaneus.  NV intact.  Compartments are soft.  No open wounds.    Cam walker boot applied and pt given crutches.  Agrees to ARICE therapy and close orthopedic f/u.    Final Clinical Impression(s) / ED Diagnoses Final diagnoses:  Injury  Closed avulsion fracture of right ankle, initial encounter    Rx / DC  Orders ED Discharge Orders    None       Felisa, Zechman, PA-C 03/13/20 2248    Vanetta Mulders, MD 03/14/20 [phone]

## 2020-03-11 NOTE — ED Triage Notes (Signed)
Pt reports she stepped into a hole on Sat and injured her right foot and ankle; twisted knee; swelling noted to right foot

## 2020-03-11 NOTE — Discharge Instructions (Addendum)
Your x-rays this evening show a possible chip fracture of your lateral right ankle.  The x-ray of your right foot does not show any broken bones.  Elevate and apply ice packs on and off to your foot and ankle.  Continue to use your walker and keep the walker boot in place.  Call Dr. Mort Sawyers office to arrange a follow-up appointment.

## 2020-09-10 ENCOUNTER — Ambulatory Visit
Admission: EM | Admit: 2020-09-10 | Discharge: 2020-09-10 | Disposition: A | Discharge status: Home / Self Care | Payer: 59 | Attending: Emergency Medicine | Admitting: Emergency Medicine

## 2020-09-10 ENCOUNTER — Other Ambulatory Visit: Payer: Self-pay

## 2020-09-10 DIAGNOSIS — Z1152 Encounter for screening for COVID-19: Secondary | ICD-10-CM

## 2020-09-10 NOTE — ED Triage Notes (Signed)
Needs covid test

## 2020-09-11 LAB — NOVEL CORONAVIRUS, NAA: SARS-CoV-2, NAA: DETECTED — AB

## 2020-09-11 LAB — SARS-COV-2, NAA 2 DAY TAT

## 2020-11-14 ENCOUNTER — Other Ambulatory Visit (HOSPITAL_COMMUNITY): Payer: Self-pay | Admitting: Physician Assistant

## 2020-11-14 DIAGNOSIS — Z1231 Encounter for screening mammogram for malignant neoplasm of breast: Secondary | ICD-10-CM

## 2020-11-28 ENCOUNTER — Encounter (HOSPITAL_COMMUNITY): Payer: Self-pay

## 2020-11-28 ENCOUNTER — Ambulatory Visit (HOSPITAL_COMMUNITY): Payer: 59

## 2021-08-25 ENCOUNTER — Emergency Department (HOSPITAL_COMMUNITY)
Admission: EM | Admit: 2021-08-25 | Discharge: 2021-08-26 | Disposition: A | Discharge status: Other Acute Inpatient Hospital | Payer: 59 | Attending: Emergency Medicine | Admitting: Emergency Medicine

## 2021-08-25 DIAGNOSIS — I2510 Atherosclerotic heart disease of native coronary artery without angina pectoris: Secondary | ICD-10-CM | POA: Insufficient documentation

## 2021-08-25 DIAGNOSIS — Z046 Encounter for general psychiatric examination, requested by authority: Secondary | ICD-10-CM | POA: Diagnosis present

## 2021-08-25 DIAGNOSIS — F1110 Opioid abuse, uncomplicated: Secondary | ICD-10-CM | POA: Insufficient documentation

## 2021-08-25 DIAGNOSIS — Z20822 Contact with and (suspected) exposure to covid-19: Secondary | ICD-10-CM | POA: Insufficient documentation

## 2021-08-25 DIAGNOSIS — R45851 Suicidal ideations: Secondary | ICD-10-CM | POA: Diagnosis not present

## 2021-08-25 DIAGNOSIS — F17210 Nicotine dependence, cigarettes, uncomplicated: Secondary | ICD-10-CM | POA: Insufficient documentation

## 2021-08-25 LAB — CBC WITH DIFFERENTIAL/PLATELET
Abs Immature Granulocytes: 0.02 10*3/uL (ref 0.00–0.07)
Basophils Absolute: 0 10*3/uL (ref 0.0–0.1)
Basophils Relative: 1 %
Eosinophils Absolute: 0 10*3/uL (ref 0.0–0.5)
Eosinophils Relative: 1 %
HCT: 45.2 % (ref 36.0–46.0)
Hemoglobin: 15 g/dL (ref 12.0–15.0)
Immature Granulocytes: 0 %
Lymphocytes Relative: 26 %
Lymphs Abs: 1.6 10*3/uL (ref 0.7–4.0)
MCH: 32.8 pg (ref 26.0–34.0)
MCHC: 33.2 g/dL (ref 30.0–36.0)
MCV: 98.7 fL (ref 80.0–100.0)
Monocytes Absolute: 0.3 10*3/uL (ref 0.1–1.0)
Monocytes Relative: 5 %
Neutro Abs: 4.1 10*3/uL (ref 1.7–7.7)
Neutrophils Relative %: 67 %
Platelets: 247 10*3/uL (ref 150–400)
RBC: 4.58 MIL/uL (ref 3.87–5.11)
RDW: 13.9 % (ref 11.5–15.5)
WBC: 6.1 10*3/uL (ref 4.0–10.5)
nRBC: 0 % (ref 0.0–0.2)

## 2021-08-25 LAB — URINALYSIS, MICROSCOPIC (REFLEX)

## 2021-08-25 LAB — COMPREHENSIVE METABOLIC PANEL
ALT: 13 U/L (ref 0–44)
AST: 13 U/L — ABNORMAL LOW (ref 15–41)
Albumin: 3.8 g/dL (ref 3.5–5.0)
Alkaline Phosphatase: 66 U/L (ref 38–126)
Anion gap: 8 (ref 5–15)
BUN: 10 mg/dL (ref 6–20)
CO2: 24 mmol/L (ref 22–32)
Calcium: 8.9 mg/dL (ref 8.9–10.3)
Chloride: 106 mmol/L (ref 98–111)
Creatinine, Ser: 0.46 mg/dL (ref 0.44–1.00)
GFR, Estimated: >60 mL/min (ref >60)
Glucose, Bld: 105 mg/dL — ABNORMAL HIGH (ref 70–99)
Potassium: 3.7 mmol/L (ref 3.5–5.1)
Sodium: 138 mmol/L (ref 135–145)
Total Bilirubin: 0.3 mg/dL (ref 0.3–1.2)
Total Protein: 6.9 g/dL (ref 6.5–8.1)

## 2021-08-25 LAB — ACETAMINOPHEN LEVEL: Acetaminophen (Tylenol), Serum: <10 ug/mL — ABNORMAL LOW (ref 10–30)

## 2021-08-25 LAB — URINALYSIS, ROUTINE W REFLEX MICROSCOPIC
Bilirubin Urine: NEGATIVE
Glucose, UA: NEGATIVE mg/dL
Ketones, ur: NEGATIVE mg/dL
Leukocytes,Ua: NEGATIVE
Nitrite: NEGATIVE
Protein, ur: NEGATIVE mg/dL
Specific Gravity, Urine: 1.01 (ref 1.005–1.030)
pH: 6 (ref 5.0–8.0)

## 2021-08-25 LAB — RAPID URINE DRUG SCREEN, HOSP PERFORMED
Amphetamines: NOT DETECTED
Barbiturates: NOT DETECTED
Benzodiazepines: POSITIVE — AB
Cocaine: POSITIVE — AB
Opiates: NOT DETECTED
Tetrahydrocannabinol: NOT DETECTED

## 2021-08-25 LAB — SALICYLATE LEVEL: Salicylate Lvl: <7 mg/dL — ABNORMAL LOW (ref 7.0–30.0)

## 2021-08-25 LAB — ETHANOL: Alcohol, Ethyl (B): <10 mg/dL (ref <10)

## 2021-08-25 MED ORDER — ATORVASTATIN CALCIUM 40 MG PO TABS: 40.0000 mg | ORAL_TABLET | Freq: Every day | ORAL | Status: DC

## 2021-08-25 MED ORDER — HYDROXYZINE HCL 25 MG PO TABS
25.0000 mg | ORAL_TABLET | Freq: Four times a day (QID) | ORAL | Status: DC | PRN
  Administered 2021-08-26: 10:00:00 25 mg via ORAL
  Filled 2021-08-25: qty 1

## 2021-08-25 MED ORDER — CLONIDINE HCL 0.1 MG PO TABS: 0.1000 mg | ORAL_TABLET | Freq: Every day | ORAL | Status: DC

## 2021-08-25 MED ORDER — LOPERAMIDE HCL 2 MG PO CAPS: 2.0000 mg | ORAL_CAPSULE | ORAL | Status: DC | PRN

## 2021-08-25 MED ORDER — DICYCLOMINE HCL 10 MG PO CAPS: 20.0000 mg | ORAL_CAPSULE | Freq: Four times a day (QID) | ORAL | Status: DC | PRN

## 2021-08-25 MED ORDER — CLOPIDOGREL BISULFATE 75 MG PO TABS
75.0000 mg | ORAL_TABLET | Freq: Every day | ORAL | Status: DC
  Administered 2021-08-26: 10:00:00 75 mg via ORAL
  Filled 2021-08-25: qty 1

## 2021-08-25 MED ORDER — METHOCARBAMOL 500 MG PO TABS
500.0000 mg | ORAL_TABLET | Freq: Three times a day (TID) | ORAL | Status: DC | PRN
Start: 2021-08-25 — End: 2021-08-26
  Administered 2021-08-26: 10:00:00 500 mg via ORAL
  Filled 2021-08-25: qty 1

## 2021-08-25 MED ORDER — NICOTINE 21 MG/24HR TD PT24
21.0000 mg | MEDICATED_PATCH | Freq: Every day | TRANSDERMAL | Status: DC
  Administered 2021-08-26: 10:00:00 21 mg via TRANSDERMAL
  Filled 2021-08-25: qty 1

## 2021-08-25 MED ORDER — ONDANSETRON 4 MG PO TBDP: 4.0000 mg | ORAL_TABLET | Freq: Four times a day (QID) | ORAL | Status: DC | PRN

## 2021-08-25 MED ORDER — CLONIDINE HCL 0.1 MG PO TABS
0.1000 mg | ORAL_TABLET | Freq: Four times a day (QID) | ORAL | Status: DC
  Administered 2021-08-26 (×2): 0.1 mg via ORAL
  Filled 2021-08-25 (×2): qty 1

## 2021-08-25 MED ORDER — CLONIDINE HCL 0.1 MG PO TABS: 0.1000 mg | ORAL_TABLET | Freq: Two times a day (BID) | ORAL | Status: DC

## 2021-08-25 MED ORDER — ACETAMINOPHEN 325 MG PO TABS: 650.0000 mg | ORAL_TABLET | ORAL | Status: DC | PRN

## 2021-08-25 MED ORDER — ZOLPIDEM TARTRATE 5 MG PO TABS: 5.0000 mg | ORAL_TABLET | Freq: Every evening | ORAL | Status: DC | PRN

## 2021-08-25 MED ORDER — NAPROXEN 250 MG PO TABS: 500.0000 mg | ORAL_TABLET | Freq: Two times a day (BID) | ORAL | Status: DC | PRN

## 2021-08-25 NOTE — ED Provider Notes (Signed)
[facility] EMERGENCY DEPARTMENT Provider Note   CSN: [id number] Arrival date & time: 08/25/21  2205     History Chief Complaint  Patient presents with   IVC   Medical Clearance    Katelyn Smith is a 55 y.o. female.  Brought to the emergency department as an involuntary commitment.  Involuntary commitment was initiated by family because patient has been using heroin and has had some passive suicidality.  Patient acknowledges that she has a problem and would like help.  She does not have an active suicide plan.  She admits to using heroin approximately an hour before coming to the emergency department.      Past Medical History:  Diagnosis Date   Anxiety    Chest pain    a. 07/2016: Stress test showing no evidence of ischemia. b. cath 08/2016: 40% Ost RCA stenosis, 70% Lateral 3rd Mrg, and 80% Acute Mrg (too small for intervention, therefore medical therapy recommended).    Chronic back pain    Chronic neck pain    Depression    H/O degenerative disc disease    High cholesterol    Lumbar radiculopathy, right    Stroke (HCC) 10/2019   Tobacco abuse     Patient Active Problem List   Diagnosis Date Noted   Small vessel disease, cerebrovascular    Heavy tobacco smoker    Hyperlipidemia LDL goal <70    Acute ischemic stroke (HCC) 06/25/2019   Stroke (cerebrum) (HCC) 06/25/2019   CAD (coronary artery disease) 09/24/2016   Unstable angina (HCC)    Chest pain 08/10/2016   Hyperlipidemia 08/10/2016   Chronic pain syndrome 08/10/2016   Tobacco abuse 08/10/2016   Hypokalemia 08/10/2016   Urge incontinence 12/15/2012   Chronic low back pain 12/15/2012    Past Surgical History:  Procedure Laterality Date   BACK SURGERY     CARDIAC CATHETERIZATION N/A 09/09/2016   Procedure: Left Heart Cath and Coronary Angiography;  Surgeon: Peter M Swaziland, MD;  Location: [facility] INVASIVE CV LAB;  Service: Cardiovascular;  Laterality: N/A;   CARDIAC CATHETERIZATION  09/09/2016   TUBAL  LIGATION       OB History     Gravida  3   Para  3   Term  3   Preterm      AB      Living  3      SAB      IAB      Ectopic      Multiple      Live Births              Family History  Problem Relation Age of Onset   Heart failure Mother    Heart disease Mother 60       first MI early 15s   Cancer Father        Died at 92   Cancer Sister    Stroke Other    Heart disease Other    Stroke Maternal Grandmother     Social History   Tobacco Use   Smoking status: Every Day    Packs/day: 0.50    Years: 30.00    Pack years: 15.00    Types: Cigarettes   Smokeless tobacco: Never   Tobacco comments:    Since age 43  Vaping Use   Vaping Use: Some days  Substance Use Topics   Alcohol use: No   Drug use: No    Home Medications Prior to Admission medications  Medication Sig Start Date End Date Taking? Authorizing Provider  atorvastatin (LIPITOR) 40 MG tablet Take 1 tablet (40 mg total) by mouth daily at 6 PM. 06/27/19   Metzger-Cihelka, Cristie Hem, NP  clopidogrel (PLAVIX) 75 MG tablet Take 1 tablet (75 mg total) by mouth daily. 06/27/19   Metzger-Cihelka, Cristie Hem, NP  oxyCODONE (ROXICODONE) 15 MG immediate release tablet Take 15 mg by mouth 4 (four) times daily as needed. 03/08/20   [provider]  Oxycodone HCl 10 MG TABS Take 10 mg by mouth every 4 (four) hours as needed for severe pain. 06/08/19   [provider]    Allergies    Mirabegron  Review of Systems   Review of Systems  Psychiatric/Behavioral:  Positive for suicidal ideas.   All other systems reviewed and are negative.  Physical Exam Updated Vital Signs BP 119/77    Pulse 97    Temp 99 F (37.2 C) (Oral)    Resp 16    Ht 5\' 3"  (1.6 m)    Wt 49.4 kg    SpO2 95%    BMI 19.31 kg/m   Physical Exam Vitals and nursing note reviewed.  Constitutional:      General: She is not in acute distress.    Appearance: Normal appearance. She is well-developed.  HENT:     Head:  Normocephalic and atraumatic.     Right Ear: Hearing normal.     Left Ear: Hearing normal.     Nose: Nose normal.  Eyes:     Conjunctiva/sclera: Conjunctivae normal.     Pupils: Pupils are equal, round, and reactive to light.  Cardiovascular:     Rate and Rhythm: Regular rhythm.     Heart sounds: S1 normal and S2 normal. No murmur heard.   No friction rub. No gallop.  Pulmonary:     Effort: Pulmonary effort is normal. No respiratory distress.     Breath sounds: Normal breath sounds.  Chest:     Chest wall: No tenderness.  Abdominal:     General: Bowel sounds are normal.     Palpations: Abdomen is soft.     Tenderness: There is no abdominal tenderness. There is no guarding or rebound. Negative signs include Murphy's sign and McBurney's sign.     Hernia: No hernia is present.  Musculoskeletal:        General: Normal range of motion.     Cervical back: Normal range of motion and neck supple.  Skin:    General: Skin is warm and dry.     Findings: No rash.  Neurological:     Mental Status: She is oriented to person, place, and time.     GCS: GCS eye subscore is 4. GCS verbal subscore is 5. GCS motor subscore is 6.     Cranial Nerves: No cranial nerve deficit.     Sensory: No sensory deficit.     Coordination: Coordination normal.     Comments: Somnolent, awakens to voice  Psychiatric:        Speech: Speech normal.        Behavior: Behavior normal.        Thought Content: Thought content normal.    ED Results / Procedures / Treatments   Labs (all labs ordered are listed, but only abnormal results are displayed) Labs Reviewed  COMPREHENSIVE METABOLIC PANEL - Abnormal; Notable for the following components:      Result Value   Glucose, Bld 105 (*)    AST 13 (*)  All other components within normal limits  RAPID URINE DRUG SCREEN, HOSP PERFORMED - Abnormal; Notable for the following components:   Cocaine POSITIVE (*)    Benzodiazepines POSITIVE (*)    All other components  within normal limits  SALICYLATE LEVEL - Abnormal; Notable for the following components:   Salicylate Lvl <7.0 (*)    All other components within normal limits  ACETAMINOPHEN LEVEL - Abnormal; Notable for the following components:   Acetaminophen (Tylenol), Serum <10 (*)    All other components within normal limits  URINALYSIS, ROUTINE W REFLEX MICROSCOPIC - Abnormal; Notable for the following components:   Hgb urine dipstick TRACE (*)    All other components within normal limits  URINALYSIS, MICROSCOPIC (REFLEX) - Abnormal; Notable for the following components:   Bacteria, UA FEW (*)    All other components within normal limits  RESP PANEL BY RT-PCR (FLU A&B, COVID) ARPGX2  ETHANOL  CBC WITH DIFFERENTIAL/PLATELET    EKG None  Radiology No results found.  Procedures Procedures   Medications Ordered in ED Medications  cloNIDine (CATAPRES) tablet 0.1 mg (has no administration in time range)    Followed by  cloNIDine (CATAPRES) tablet 0.1 mg (has no administration in time range)    Followed by  cloNIDine (CATAPRES) tablet 0.1 mg (has no administration in time range)  dicyclomine (BENTYL) capsule 20 mg (has no administration in time range)  hydrOXYzine (ATARAX) tablet 25 mg (has no administration in time range)  loperamide (IMODIUM) capsule 2-4 mg (has no administration in time range)  methocarbamol (ROBAXIN) tablet 500 mg (has no administration in time range)  naproxen (NAPROSYN) tablet 500 mg (has no administration in time range)  ondansetron (ZOFRAN-ODT) disintegrating tablet 4 mg (has no administration in time range)  acetaminophen (TYLENOL) tablet 650 mg (has no administration in time range)  zolpidem (AMBIEN) tablet 5 mg (has no administration in time range)  nicotine (NICODERM CQ - dosed in mg/24 hours) patch 21 mg (has no administration in time range)  clopidogrel (PLAVIX) tablet 75 mg (has no administration in time range)  atorvastatin (LIPITOR) tablet 40 mg (has no  administration in time range)    ED Course  I have reviewed the triage vital signs and the nursing notes.  Pertinent labs & imaging results that were available during my care of the patient were reviewed by me and considered in my medical decision making (see chart for details).    MDM Rules/Calculators/A&P                         Patient presents as an IVC initiated by family secondary to some suicidal ideation as well as chronic heroin use.  Patient will require behavioral health evaluation.    Final Clinical Impression(s) / ED Diagnoses Final diagnoses:  Suicidal ideation  Opiate abuse, continuous (HCC)    Rx / DC Orders ED Discharge Orders     None        Bea Duren, Canary Brim, MD 08/25/21 2350

## 2021-08-25 NOTE — ED Notes (Signed)
Pt dressed out, belongings placed in bag along with pts rings and earrings. Pt wanded by security

## 2021-08-25 NOTE — ED Notes (Signed)
Informs this RN she used a small amount of heroin aprox 1 hour ago, pt currently A&O

## 2021-08-25 NOTE — ED Triage Notes (Signed)
Pt to ED via law enforcement IVC. Pt reports she has a drug problem , using heroin over the past year. Pt also endorsing passive SI without plan. Verbally contracts for safety, tearful during triage.

## 2021-08-25 NOTE — ED Triage Notes (Signed)
Bexlee Bergdoll (pt daughter )[phone]. Pt gives verbal consent to discuss POC.

## 2021-08-26 ENCOUNTER — Inpatient Hospital Stay (HOSPITAL_COMMUNITY)
Admission: AD | Admit: 2021-08-26 | Discharge: 2021-08-30 | DRG: 885 | Disposition: A | Discharge status: Home / Self Care | Payer: 59 | Attending: Emergency Medicine | Admitting: Emergency Medicine

## 2021-08-26 ENCOUNTER — Other Ambulatory Visit: Payer: Self-pay

## 2021-08-26 ENCOUNTER — Encounter (HOSPITAL_COMMUNITY): Payer: Self-pay | Admitting: Nurse Practitioner

## 2021-08-26 DIAGNOSIS — Z681 Body mass index (BMI) 19 or less, adult: Secondary | ICD-10-CM

## 2021-08-26 DIAGNOSIS — Z809 Family history of malignant neoplasm, unspecified: Secondary | ICD-10-CM | POA: Diagnosis not present

## 2021-08-26 DIAGNOSIS — F17210 Nicotine dependence, cigarettes, uncomplicated: Secondary | ICD-10-CM | POA: Diagnosis present

## 2021-08-26 DIAGNOSIS — Z888 Allergy status to other drugs, medicaments and biological substances status: Secondary | ICD-10-CM

## 2021-08-26 DIAGNOSIS — Z8249 Family history of ischemic heart disease and other diseases of the circulatory system: Secondary | ICD-10-CM | POA: Diagnosis not present

## 2021-08-26 DIAGNOSIS — E7800 Pure hypercholesterolemia, unspecified: Secondary | ICD-10-CM | POA: Diagnosis present

## 2021-08-26 DIAGNOSIS — F1110 Opioid abuse, uncomplicated: Secondary | ICD-10-CM | POA: Diagnosis not present

## 2021-08-26 DIAGNOSIS — F332 Major depressive disorder, recurrent severe without psychotic features: Principal | ICD-10-CM | POA: Diagnosis present

## 2021-08-26 DIAGNOSIS — F1123 Opioid dependence with withdrawal: Secondary | ICD-10-CM | POA: Diagnosis present

## 2021-08-26 DIAGNOSIS — R45851 Suicidal ideations: Secondary | ICD-10-CM | POA: Diagnosis present

## 2021-08-26 DIAGNOSIS — E559 Vitamin D deficiency, unspecified: Secondary | ICD-10-CM | POA: Diagnosis present

## 2021-08-26 DIAGNOSIS — Z8673 Personal history of transient ischemic attack (TIA), and cerebral infarction without residual deficits: Secondary | ICD-10-CM

## 2021-08-26 DIAGNOSIS — R636 Underweight: Secondary | ICD-10-CM | POA: Diagnosis present

## 2021-08-26 DIAGNOSIS — M5416 Radiculopathy, lumbar region: Secondary | ICD-10-CM | POA: Diagnosis present

## 2021-08-26 DIAGNOSIS — F419 Anxiety disorder, unspecified: Secondary | ICD-10-CM | POA: Diagnosis present

## 2021-08-26 DIAGNOSIS — G8929 Other chronic pain: Secondary | ICD-10-CM | POA: Diagnosis present

## 2021-08-26 DIAGNOSIS — Z823 Family history of stroke: Secondary | ICD-10-CM | POA: Diagnosis not present

## 2021-08-26 DIAGNOSIS — G4700 Insomnia, unspecified: Secondary | ICD-10-CM | POA: Diagnosis present

## 2021-08-26 LAB — RESP PANEL BY RT-PCR (FLU A&B, COVID) ARPGX2
Influenza A by PCR: NEGATIVE
Influenza B by PCR: NEGATIVE
SARS Coronavirus 2 by RT PCR: NEGATIVE

## 2021-08-26 MED ORDER — NAPROXEN 500 MG PO TABS
500.0000 mg | ORAL_TABLET | Freq: Two times a day (BID) | ORAL | Status: DC | PRN
  Administered 2021-08-26 – 2021-08-30 (×6): 500 mg via ORAL
  Filled 2021-08-26 (×6): qty 1

## 2021-08-26 MED ORDER — NICOTINE 14 MG/24HR TD PT24
14.0000 mg | MEDICATED_PATCH | Freq: Every day | TRANSDERMAL | Status: DC
  Administered 2021-08-26 – 2021-08-30 (×5): 14 mg via TRANSDERMAL
  Filled 2021-08-26 (×6): qty 1

## 2021-08-26 MED ORDER — CLOPIDOGREL BISULFATE 75 MG PO TABS
75.0000 mg | ORAL_TABLET | Freq: Every day | ORAL | Status: DC
  Administered 2021-08-26 – 2021-08-30 (×5): 75 mg via ORAL
  Filled 2021-08-26 (×6): qty 1

## 2021-08-26 MED ORDER — ONDANSETRON 4 MG PO TBDP
4.0000 mg | ORAL_TABLET | Freq: Four times a day (QID) | ORAL | Status: DC | PRN
  Administered 2021-08-26 – 2021-08-30 (×4): 4 mg via ORAL
  Filled 2021-08-26 (×4): qty 1

## 2021-08-26 MED ORDER — HYDROXYZINE HCL 25 MG PO TABS
25.0000 mg | ORAL_TABLET | Freq: Four times a day (QID) | ORAL | Status: DC | PRN
  Administered 2021-08-26 – 2021-08-28 (×4): 25 mg via ORAL
  Filled 2021-08-26 (×4): qty 1

## 2021-08-26 MED ORDER — ACETAMINOPHEN 325 MG PO TABS
650.0000 mg | ORAL_TABLET | ORAL | Status: DC | PRN
  Administered 2021-08-26 – 2021-08-29 (×3): 650 mg via ORAL
  Filled 2021-08-26 (×3): qty 2

## 2021-08-26 MED ORDER — CLONIDINE HCL 0.1 MG PO TABS
0.1000 mg | ORAL_TABLET | Freq: Four times a day (QID) | ORAL | Status: AC
  Administered 2021-08-26 – 2021-08-27 (×7): 0.1 mg via ORAL
  Filled 2021-08-26 (×8): qty 1

## 2021-08-26 MED ORDER — METHOCARBAMOL 500 MG PO TABS
500.0000 mg | ORAL_TABLET | Freq: Three times a day (TID) | ORAL | Status: DC | PRN
  Administered 2021-08-26 – 2021-08-30 (×7): 500 mg via ORAL
  Filled 2021-08-26 (×7): qty 1

## 2021-08-26 MED ORDER — ATORVASTATIN CALCIUM 40 MG PO TABS
40.0000 mg | ORAL_TABLET | Freq: Every day | ORAL | Status: DC
  Administered 2021-08-26 – 2021-08-29 (×4): 40 mg via ORAL
  Filled 2021-08-26 (×5): qty 1

## 2021-08-26 MED ORDER — MIRTAZAPINE 15 MG PO TABS
7.5000 mg | ORAL_TABLET | Freq: Every evening | ORAL | Status: DC | PRN
  Administered 2021-08-26: 21:00:00 7.5 mg via ORAL
  Filled 2021-08-26: qty 1

## 2021-08-26 MED ORDER — ENSURE ENLIVE PO LIQD
237.0000 mL | Freq: Two times a day (BID) | ORAL | Status: DC
  Administered 2021-08-26 – 2021-08-27 (×2): 237 mL via ORAL
  Filled 2021-08-26 (×6): qty 237

## 2021-08-26 MED ORDER — CLONIDINE HCL 0.1 MG PO TABS
0.1000 mg | ORAL_TABLET | Freq: Two times a day (BID) | ORAL | Status: AC
  Administered 2021-08-28 – 2021-08-29 (×4): 0.1 mg via ORAL
  Filled 2021-08-26 (×4): qty 1

## 2021-08-26 MED ORDER — LOPERAMIDE HCL 2 MG PO CAPS
2.0000 mg | ORAL_CAPSULE | ORAL | Status: DC | PRN
Start: 2021-08-26 — End: 2021-08-30

## 2021-08-26 MED ORDER — DICYCLOMINE HCL 10 MG PO CAPS
20.0000 mg | ORAL_CAPSULE | Freq: Four times a day (QID) | ORAL | Status: DC | PRN
  Administered 2021-08-26 – 2021-08-27 (×4): 20 mg via ORAL
  Filled 2021-08-26 (×4): qty 2

## 2021-08-26 MED ORDER — CLONIDINE HCL 0.1 MG PO TABS
0.1000 mg | ORAL_TABLET | Freq: Every day | ORAL | Status: DC
  Administered 2021-08-30: 0.1 mg via ORAL
  Filled 2021-08-26 (×2): qty 1

## 2021-08-26 NOTE — Tx Team (Signed)
Initial Treatment Plan 08/26/2021 4:27 PM Katelyn Smith [id number]    PATIENT STRESSORS: Marital or family conflict   Substance abuse     PATIENT STRENGTHS: Motivation for treatment/growth  Supportive family/friends    PATIENT IDENTIFIED PROBLEMS: Substance use  Coping skills  Anxiety                  DISCHARGE CRITERIA:  Improved stabilization in mood, thinking, and/or behavior Verbal commitment to aftercare and medication compliance Withdrawal symptoms are absent or subacute and managed without 24-hour nursing intervention  PRELIMINARY DISCHARGE PLAN: Attend PHP/IOP Return to previous living arrangement  PATIENT/FAMILY INVOLVEMENT: This treatment plan has been presented to and reviewed with the patient, Katelyn Smith.The patient has been given the opportunity to ask questions and make suggestions.  Sofie Hartigan, RN 08/26/2021, 4:27 PM

## 2021-08-26 NOTE — [facility] Group Notes (Signed)
PT was informed but did not attend group. ?

## 2021-08-26 NOTE — ED Provider Notes (Signed)
Emergency Medicine Observation Re-evaluation Note  Sulay Brymer Disch is a 55 y.o. female, seen on rounds today.  Pt initially presented to the ED for complaints of IVC and Medical Clearance Currently, the patient is resting comfortably.  Physical Exam  BP (!) 141/92 (BP Location: Left Arm)    Pulse 78    Temp 98 F (36.7 C) (Oral)    Resp 16    Ht 5\' 3"  (1.6 m)    Wt 49.4 kg    SpO2 93%    BMI 19.31 kg/m  Physical Exam General: Disheveled Cardiac: Normal heart rate Lungs: Normal respiratory rate Psych: Not internally responsive  ED Course / MDM  EKG:EKG Interpretation  Date/Time:  Monday August 25 2021 23:19:01 EST Ventricular Rate:  85 PR Interval:  130 QRS Duration: 106 QT Interval:  402 QTC Calculation: 478 R Axis:   95 Text Interpretation: Normal sinus rhythm Incomplete right bundle branch block Possible Right ventricular hypertrophy Nonspecific ST abnormality Abnormal ECG Interpretation limited secondary to artifact Confirmed by [id number] (Zadie Rhine) on 08/26/2021 8:10:51 AM  I have reviewed the labs performed to date as well as medications administered while in observation.  Recent changes in the last 24 hours include cooperative with efforts to assist her.  Plan  Current plan is for transfer to the behavioral health hospital. SHENIQUA CAROLAN is under involuntary commitment.      Phebe Colla, MD 08/26/21 1007

## 2021-08-26 NOTE — Progress Notes (Signed)
Pt was having withdrawal symptoms when she came to [facility]. Pt stated that she has good relationships with her husband and three daughters. Pt says that they are supportive and one of her daughters IVC her. Pt was positive for cocaine and benzos. Pt stated that she used heroine and does not drink. Pt stated that one of her stressors was "not being able to get what I want", as in heroin. Pt did stated that she uses everyday. Pt did state that she did want to stop using and was interested in rehab. Pt denies SI at this time. Pt was asking that RN given her something to take the pain away. Pt COWS was 12. Pt was not very forth coming on the situation. Pt did state that she did have a gun but she was not going to follow through with it. Daughter and pt were arguing and "screaming at each other." Consents signed, handbook detailing the patient's rights, responsibilities, and visitor guidelines provided. Skin/belongings search completed and patient oriented to unit. Patient stable at this time. Patient given the opportunity to express concerns and ask questions. Patient given toiletries. Will continue to monitor.    08/26/21 1300  Psych Admission Type (Psych Patients Only)  Admission Status Involuntary  Psychosocial Assessment  Patient Complaints Other (Comment);Substance abuse;Shakiness;Irritability;Anxiety;Sleep disturbance (withdrawal symptoms)  Eye Contact Brief  Facial Expression Grimacing;Sad  Affect Anxious  Speech Logical/coherent  Interaction Forwards little;Needy  Motor Activity Unsteady;Tremors;Slow  Appearance/Hygiene In scrubs  Behavior Characteristics Cooperative;Anxious  Mood Anxious;Other (Comment) (withdrawing)  Aggressive Behavior  Effect No apparent injury  Thought Process  Coherency WDL  Content WDL  Delusions None reported or observed  Perception WDL  Hallucination None reported or observed  Judgment Impaired  Confusion None  Danger to Self  Current suicidal ideation? Denies   Danger to Others  Danger to Others None reported or observed

## 2021-08-26 NOTE — ED Notes (Addendum)
Family member who works here, is at bedside speaking with pt.  Asking about medications and what she has taken

## 2021-08-26 NOTE — [facility] Group Notes (Signed)
[facility] Group Notes:  (Nursing/MHT/Case Management/Adjunct)  Date:  08/26/2021  Time:  4:22 PM  Type of Therapy:  Psychoeducational Skills  Participation Level:  Did Not Attend  Participation Quality:      Affect:    Cognitive:      Insight:  None  Engagement in Group:      Modes of Intervention:      Summary of Progress/Problems: Did not attend  Reymundo Poll 08/26/2021, 4:22 PM

## 2021-08-26 NOTE — ED Notes (Signed)
Pt. Placed in ed 18 for TTS. TTS is in process

## 2021-08-26 NOTE — ED Notes (Signed)
Daughter Cheryln Balcom works on [employer].  Would like to be contacted if any issues or questions arise

## 2021-08-26 NOTE — H&P (Signed)
Psychiatric Admission Assessment Adult  Patient Identification: Katelyn Smith MRN:  [id number] Date of Evaluation:  08/26/2021 Chief Complaint:  MDD (major depressive disorder), recurrent episode, severe (HCC) [F33.2] Principal Diagnosis: MDD (major depressive disorder), recurrent episode, severe (HCC) Diagnosis:  Principal Problem:   MDD (major depressive disorder), recurrent episode, severe (HCC) Active Problems:   Opioid dependence with withdrawal (HCC)  History of Present Illness: Katelyn Smith is a 55 YO F with a history of substance use and depression who started using oral opioids following a surgery at age 45 and transitioned to heroin a year ago. She has had symptoms of withdrawal before but never sought treatment or community support group. No psychiatric treatment history. She came to the ER with passive SI yesterday. Last use was 12/25. Usually uses 2 dime bags per day. She denies hallucinations, thoughts of harm to self or others during my interview. No apparent RIS. No mania history.  Associated Signs/Symptoms: Depression Symptoms:  insomnia, psychomotor retardation, fatigue, difficulty concentrating, weight loss, Duration of Depression Symptoms: Less than two weeks  (Hypo) Manic Symptoms:   n/a Anxiety Symptoms:   n/a Psychotic Symptoms:   n/a PTSD Symptoms: NA Total Time spent with patient: 45 minutes  Past Psychiatric History: substance abuse, depressed mood   Is the patient at risk to self? No.  Has the patient been a risk to self in the past 6 months? Yes.    Has the patient been a risk to self within the distant past? No.  Is the patient a risk to others? No.  Has the patient been a risk to others in the past 6 months? No.  Has the patient been a risk to others within the distant past? No.   Prior Inpatient Therapy:   Prior Outpatient Therapy:    Alcohol Screening: 1. How often do you have a drink containing alcohol?: Never 2. How many drinks containing alcohol  do you have on a typical day when you are drinking?: 1 or 2 3. How often do you have six or more drinks on one occasion?: Never AUDIT-C Score: 0 4. How often during the last year have you found that you were not able to stop drinking once you had started?: Never 5. How often during the last year have you failed to do what was normally expected from you because of drinking?: Never 6. How often during the last year have you needed a first drink in the morning to get yourself going after a heavy drinking session?: Never 7. How often during the last year have you had a feeling of guilt of remorse after drinking?: Never 8. How often during the last year have you been unable to remember what happened the night before because you had been drinking?: Never 9. Have you or someone else been injured as a result of your drinking?: No 10. Has a relative or friend or a doctor or another health worker been concerned about your drinking or suggested you cut down?: No Alcohol Use Disorder Identification Test Final Score (AUDIT): 0 Substance Abuse History in the last 12 months:  Yes.   Consequences of Substance Abuse: Family Consequences:  conflict with family resulting in thoughts of suicide Withdrawal Symptoms:   Diaphoresis Generalized pain Previous Psychotropic Medications: Yes  Psychological Evaluations: No  Past Medical History:  Past Medical History:  Diagnosis Date   Anxiety    Chest pain    a. 07/2016: Stress test showing no evidence of ischemia. b. cath 08/2016: 40% Ost RCA stenosis,  70% Lateral 3rd Mrg, and 80% Acute Mrg (too small for intervention, therefore medical therapy recommended).    Chronic back pain    Chronic neck pain    Depression    H/O degenerative disc disease    High cholesterol    Lumbar radiculopathy, right    Stroke ([place]) 10/2019   Tobacco abuse     Past Surgical History:  Procedure Laterality Date   BACK SURGERY     CARDIAC CATHETERIZATION N/A 09/09/2016   Procedure:  Left Heart Cath and Coronary Angiography;  Surgeon: Peter M Martinique, MD;  Location: [facility];  Service: Cardiovascular;  Laterality: N/A;   CARDIAC CATHETERIZATION  09/09/2016   TUBAL LIGATION     Family History:  Family History  Problem Relation Age of Onset   Heart failure Mother    Heart disease Mother 72       first MI early 49s   Cancer Father        Died at 77   Cancer Sister    Stroke Other    Heart disease Other    Stroke Maternal Grandmother    Family Psychiatric  History: denies Tobacco Screening:   Social History:  Social History   Substance and Sexual Activity  Alcohol Use No     Social History   Substance and Sexual Activity  Drug Use No    Additional Social History:    Living in [place] with spouse. Currently working in foodservice. Has 3 adult children.   Allergies:   Allergies  Allergen Reactions   Mirabegron Hypertension   Lab Results:  Results for orders placed or performed during the hospital encounter of 08/25/21 (from the past 48 hour(s))  Comprehensive metabolic panel     Status: Abnormal   Collection Time: 08/25/21 10:37 PM  Result Value Ref Range   Sodium 138 135 - 145 mmol/L   Potassium 3.7 3.5 - 5.1 mmol/L   Chloride 106 98 - 111 mmol/L   CO2 24 22 - 32 mmol/L   Glucose, Bld 105 (H) 70 - 99 mg/dL    Comment: Glucose reference range applies only to samples taken after fasting for at least 8 hours.   BUN 10 6 - 20 mg/dL   Creatinine, Ser 0.46 0.44 - 1.00 mg/dL   Calcium 8.9 8.9 - 10.3 mg/dL   Total Protein 6.9 6.5 - 8.1 g/dL   Albumin 3.8 3.5 - 5.0 g/dL   AST 13 (L) 15 - 41 U/L   ALT 13 0 - 44 U/L   Alkaline Phosphatase 66 38 - 126 U/L   Total Bilirubin 0.3 0.3 - 1.2 mg/dL   GFR, Estimated >60 >60 mL/min    Comment: (NOTE) Calculated using the CKD-EPI Creatinine Equation (2021)    Anion gap 8 5 - 15    Comment: Performed at [facility], [address], [place], [place] [address]  Ethanol     Status: None    Collection Time: 08/25/21 10:37 PM  Result Value Ref Range   Alcohol, Ethyl (B) <10 <10 mg/dL    Comment: (NOTE) Lowest detectable limit for serum alcohol is 10 mg/dL.  For medical purposes only. Performed at [facility], [address] [place], [place] [address]   CBC with Diff     Status: None   Collection Time: 08/25/21 10:37 PM  Result Value Ref Range   WBC 6.1 4.0 - 10.5 K/uL   RBC 4.58 3.87 - 5.11 MIL/uL   Hemoglobin 15.0 12.0 -  15.0 g/dL   HCT 45.2 36.0 - 46.0 %   MCV 98.7 80.0 - 100.0 fL   MCH 32.8 26.0 - 34.0 pg   MCHC 33.2 30.0 - 36.0 g/dL   RDW 13.9 11.5 - 15.5 %   Platelets 247 150 - 400 K/uL   nRBC 0.0 0.0 - 0.2 %   Neutrophils Relative % 67 %   Neutro Abs 4.1 1.7 - 7.7 K/uL   Lymphocytes Relative 26 %   Lymphs Abs 1.6 0.7 - 4.0 K/uL   Monocytes Relative 5 %   Monocytes Absolute 0.3 0.1 - 1.0 K/uL   Eosinophils Relative 1 %   Eosinophils Absolute 0.0 0.0 - 0.5 K/uL   Basophils Relative 1 %   Basophils Absolute 0.0 0.0 - 0.1 K/uL   Immature Granulocytes 0 %   Abs Immature Granulocytes 0.02 0.00 - 0.07 K/uL    Comment: Performed at [facility], [address], [place], [place] [id number]  Salicylate level     Status: Abnormal   Collection Time: 08/25/21 10:37 PM  Result Value Ref Range   Salicylate Lvl [id number] (L) 7.0 - 30.0 mg/dL    Comment: Performed at [facility], [address] [place], [place] [address]  Acetaminophen level     Status: Abnormal   Collection Time: 08/25/21 10:37 PM  Result Value Ref Range   Acetaminophen (Tylenol), Serum <10 (L) 10 - 30 ug/mL    Comment: (NOTE) Therapeutic concentrations vary significantly. A range of 10-30 ug/mL  may be an effective concentration for many patients. However, some  are best treated at concentrations outside of this range. Acetaminophen concentrations >150 ug/mL at 4 hours after ingestion  and >50 ug/mL at 12 hours after ingestion are often associated with  toxic reactions.  Performed at [facility], [address], [place], [place] [address]   Urine rapid drug screen (hosp performed)     Status: Abnormal   Collection Time: 08/25/21 11:00 PM  Result Value Ref Range   Opiates NONE DETECTED NONE DETECTED   Cocaine POSITIVE (A) NONE DETECTED   Benzodiazepines POSITIVE (A) NONE DETECTED   Amphetamines NONE DETECTED NONE DETECTED   Tetrahydrocannabinol NONE DETECTED NONE DETECTED   Barbiturates NONE DETECTED NONE DETECTED    Comment: (NOTE) DRUG SCREEN FOR MEDICAL PURPOSES ONLY.  IF CONFIRMATION IS NEEDED FOR ANY PURPOSE, NOTIFY LAB WITHIN 5 DAYS.  LOWEST DETECTABLE LIMITS FOR URINE DRUG SCREEN Drug Class                     Cutoff (ng/mL) Amphetamine and metabolites    1000 Barbiturate and metabolites    200 Benzodiazepine                 [id number] Tricyclics and metabolites     300 Opiates and metabolites        300 Cocaine and metabolites        300 THC                            50 Performed at [facility], [address], [place], [place] [address]   Urinalysis, Routine w reflex microscopic Urine, Clean Catch     Status: Abnormal   Collection Time: 08/25/21 11:00 PM  Result Value Ref Range   Color, Urine YELLOW YELLOW   APPearance CLEAR CLEAR   Specific Gravity, Urine 1.010 1.005 - 1.030   pH 6.0 5.0 - 8.0   Glucose,  UA NEGATIVE NEGATIVE mg/dL   Hgb urine dipstick TRACE (A) NEGATIVE   Bilirubin Urine NEGATIVE NEGATIVE   Ketones, ur NEGATIVE NEGATIVE mg/dL   Protein, ur NEGATIVE NEGATIVE mg/dL   Nitrite NEGATIVE NEGATIVE   Leukocytes,Ua NEGATIVE NEGATIVE    Comment: Performed at [facility], [address] [place], [place] [address]  Urinalysis, Microscopic (reflex)     Status: Abnormal   Collection Time: 08/25/21 11:00 PM  Result Value Ref Range   RBC / HPF 0-5 0 - 5 RBC/hpf   WBC, UA 0-5 0 - 5 WBC/hpf   Bacteria, UA FEW (A) NONE SEEN   Squamous Epithelial / LPF 0-5 0 - 5   Mucus PRESENT     Comment: Performed at [facility], [address], [place], [place]  [address]  Resp Panel by RT-PCR (Flu A&B, Covid) Nasopharyngeal Swab     Status: None   Collection Time: 08/25/21 11:31 PM   Specimen: Nasopharyngeal Swab; Nasopharyngeal(NP) swabs in vial transport medium  Result Value Ref Range   SARS Coronavirus 2 by RT PCR NEGATIVE NEGATIVE    Comment: (NOTE) SARS-CoV-2 target nucleic acids are NOT DETECTED.  The SARS-CoV-2 RNA is generally detectable in upper respiratory specimens during the acute phase of infection. The lowest concentration of SARS-CoV-2 viral copies this assay can detect is 138 copies/mL. A negative result does not preclude SARS-Cov-2 infection and should not be used as the sole basis for treatment or other patient management decisions. A negative result may occur with  improper specimen collection/handling, submission of specimen other than nasopharyngeal swab, presence of viral mutation(s) within the areas targeted by this assay, and inadequate number of viral copies(<138 copies/mL). A negative result must be combined with clinical observations, patient history, and epidemiological information. The expected result is Negative.  Fact Sheet for Patients:  EntrepreneurPulse.com.au  Fact Sheet for Healthcare Providers:  IncredibleEmployment.be  This test is no t yet approved or cleared by the [place] FDA and  has been authorized for detection and/or diagnosis of SARS-CoV-2 by FDA under an Emergency Use Authorization (EUA). This EUA will remain  in effect (meaning this test can be used) for the duration of the COVID-19 declaration under Section 564(b)(1) of the Act, 21 U.S.C.section 360bbb-3(b)(1), unless the authorization is terminated  or revoked sooner.       Influenza A by PCR NEGATIVE NEGATIVE   Influenza B by PCR NEGATIVE NEGATIVE    Comment: (NOTE) The Xpert Xpress SARS-CoV-2/FLU/RSV plus assay is intended as an aid in the diagnosis of influenza from Nasopharyngeal swab  specimens and should not be used as a sole basis for treatment. Nasal washings and aspirates are unacceptable for Xpert Xpress SARS-CoV-2/FLU/RSV testing.  Fact Sheet for Patients: EntrepreneurPulse.com.au  Fact Sheet for Healthcare Providers: IncredibleEmployment.be  This test is not yet approved or cleared by the [place] FDA and has been authorized for detection and/or diagnosis of SARS-CoV-2 by FDA under an Emergency Use Authorization (EUA). This EUA will remain in effect (meaning this test can be used) for the duration of the COVID-19 declaration under Section 564(b)(1) of the Act, 21 U.S.C. section 360bbb-3(b)(1), unless the authorization is terminated or revoked.  Performed at [facility], [address], [place], [place] [address]     Blood Alcohol level:  Lab Results  Component Value Date   [facility] <10 [id number]    Metabolic Disorder Labs:  Lab Results  Component Value Date   HGBA1C 5.6 06/26/2019   MPG 114.02 06/26/2019   No  results found for: PROLACTIN Lab Results  Component Value Date   CHOL 144 06/26/2019   TRIG 99 06/26/2019   HDL 32 (L) 06/26/2019   CHOLHDL 4.5 06/26/2019   VLDL 20 06/26/2019   LDLCALC 92 06/26/2019   LDLCALC 138 (H) 08/10/2016    Current Medications: Current Facility-Administered Medications  Medication Dose Route Frequency Provider Last Rate Last Admin   acetaminophen (TYLENOL) tablet 650 mg  650 mg Oral Q4H PRN Rozetta Nunnery, NP       atorvastatin (LIPITOR) tablet 40 mg  40 mg Oral q1800 Lindon Romp A, NP       cloNIDine (CATAPRES) tablet 0.1 mg  0.1 mg Oral QID Lindon Romp A, NP   0.1 mg at 08/26/21 1327   Followed by   Derrill Memo ON 08/28/2021] cloNIDine (CATAPRES) tablet 0.1 mg  0.1 mg Oral BID Rozetta Nunnery, NP       Followed by   Derrill Memo ON 08/30/2021] cloNIDine (CATAPRES) tablet 0.1 mg  0.1 mg Oral Daily Lindon Romp A, NP       clopidogrel (PLAVIX) tablet 75 mg  75 mg Oral Daily  Lindon Romp A, NP       dicyclomine (BENTYL) capsule 20 mg  20 mg Oral Q6H PRN Lindon Romp A, NP   20 mg at 08/26/21 1328   feeding supplement (ENSURE ENLIVE / ENSURE PLUS) liquid 237 mL  237 mL Oral BID BM Rosezetta Schlatter, MD       hydrOXYzine (ATARAX) tablet 25 mg  25 mg Oral Q6H PRN Rozetta Nunnery, NP       loperamide (IMODIUM) capsule 2-4 mg  2-4 mg Oral PRN Rozetta Nunnery, NP       methocarbamol (ROBAXIN) tablet 500 mg  500 mg Oral Q8H PRN Lindon Romp A, NP       naproxen (NAPROSYN) tablet 500 mg  500 mg Oral BID PRN Lindon Romp A, NP   500 mg at 08/26/21 1327   nicotine (NICODERM CQ - dosed in mg/24 hours) patch 14 mg  14 mg Transdermal Daily Rosezetta Schlatter, MD       ondansetron (ZOFRAN-ODT) disintegrating tablet 4 mg  4 mg Oral Q6H PRN Rozetta Nunnery, NP   4 mg at 08/26/21 1327   PTA Medications: Medications Prior to Admission  Medication Sig Dispense Refill Last Dose   atorvastatin (LIPITOR) 40 MG tablet Take 1 tablet (40 mg total) by mouth daily at 6 PM. 30 tablet 3    clopidogrel (PLAVIX) 75 MG tablet Take 1 tablet (75 mg total) by mouth daily. 30 tablet 3    oxyCODONE (ROXICODONE) 15 MG immediate release tablet Take 15 mg by mouth 4 (four) times daily as needed.      Oxycodone HCl 10 MG TABS Take 10 mg by mouth every 4 (four) hours as needed for severe pain.       Musculoskeletal: Strength & Muscle Tone: within normal limits Gait & Station: unsteady Patient leans: N/A            Psychiatric Specialty Exam:  Presentation  General Appearance: Disheveled  Eye Contact:Fleeting  Speech:Normal Rate  Speech Volume:Normal  Handedness:Right   Mood and Affect  Mood:Dysphoric  Affect:Flat   Thought Process  Thought Processes:Coherent  Duration of Psychotic Symptoms: No data recorded Past Diagnosis of Schizophrenia or Psychoactive disorder: No  Descriptions of Associations:Intact  Orientation:Full (Time, Place and Person)  Thought  Content:Logical  Hallucinations:Hallucinations: None  Ideas of Reference:None  Suicidal Thoughts:Suicidal Thoughts:  No  Homicidal Thoughts:Homicidal Thoughts: No   Sensorium  Memory:Immediate Fair  Judgment:Fair  Insight:Fair   Executive Functions  Concentration:Fair  Attention Span:Poor  [place]   Psychomotor Activity  Psychomotor Activity:Psychomotor Activity: Decreased   Assets  Assets:Social Support; Housing; Desire for Improvement   Sleep  Sleep:Sleep: Poor    Physical Exam: Physical Exam Vitals and nursing note reviewed.  Constitutional:      Appearance: She is ill-appearing and diaphoretic.  HENT:     Head: Normocephalic.     Nose: Nose normal.  Eyes:     Extraocular Movements: Extraocular movements intact.  Pulmonary:     Effort: Pulmonary effort is normal.  Neurological:     Mental Status: She is alert and oriented to person, place, and time.     Motor: Tremor present.  Psychiatric:        Mood and Affect: Mood is depressed. Affect is flat.        Speech: Speech normal.        Behavior: Behavior is slowed. Behavior is cooperative.        Thought Content: Thought content is not paranoid or delusional. Thought content does not include homicidal or suicidal ideation.        Cognition and Memory: Cognition and memory normal.        Judgment: Judgment normal.     Comments: Short attention span   Review of Systems  Constitutional:  Positive for chills, diaphoresis, malaise/fatigue and weight loss.  Respiratory:  Negative for cough.   Cardiovascular:  Negative for chest pain.  Gastrointestinal:  Positive for nausea. Negative for constipation, diarrhea and vomiting.  Musculoskeletal:  Positive for joint pain and myalgias.  Neurological:  Positive for tremors.  Psychiatric/Behavioral:  Positive for depression and substance abuse. Negative for hallucinations and suicidal ideas. The patient has  insomnia.   Blood pressure (!) 155/100, pulse 86, temperature 98 F (36.7 C), temperature source Oral, resp. rate 17, height 5\' 3"  (1.6 m), weight 47.6 kg, SpO2 98 %. Body mass index is 18.6 kg/m.  Treatment Plan Summary: Daily contact with patient to assess and evaluate symptoms and progress in treatment, Medication management, and Plan      Safety and Monitoring --  Admission to inpatient psychiatric unit for safety, stabilization and treatment -- Daily contact with patient to assess and evaluate symptoms and progress in treatment -- Patient's case to be discussed in multi-disciplinary team meeting. -- Patient will be encouraged to participate in the therapeutic group milieu. -- Observation Level : q15 minute checks -- Vital signs:  q12 hours -- Precautions: suicide.  Plan  -Monitor Vitals. -Monitor for thoughts of harm to self or others -Monitor for psychosis, disorganization or changes to cognition -Monitor for withdrawal symptoms. -Monitor for medication side effects.   Observation Level/Precautions:  [place]op[place] 15 minute checks  Laboratory:  CBC Chemistry Profile HbAIC  Psychotherapy:  insight based  Medications:  see orders  Consultations:  n/a  Discharge Concerns:  psychiatry, substance abuse, therapy  Estimated LOS: 5-7 d  Other:  n/a   Physician Treatment Plan for Primary Diagnosis: MDD (major depressive disorder), recurrent episode, severe ([place]) Long Term Goal(s): Improvement in symptoms so as ready for discharge  Short Term Goals: Ability to identify changes in lifestyle to reduce recurrence of condition will improve, Ability to verbalize feelings will improve, Ability to disclose and discuss suicidal ideas, Ability to demonstrate self-control will improve, Ability to identify and develop effective coping behaviors will  improve, Ability to maintain clinical measurements within normal limits will improve, Compliance with prescribed medications will improve, and  Ability to identify triggers associated with substance abuse/mental health issues will improve  Physician Treatment Plan for Secondary Diagnosis: Principal Problem:   MDD (major depressive disorder), recurrent episode, severe ([place]) Active Problems:   Opioid dependence with withdrawal ([place])  Long Term Goal(s): Improvement in symptoms so as ready for discharge  Short Term Goals: Ability to identify changes in lifestyle to reduce recurrence of condition will improve, Ability to verbalize feelings will improve, Ability to disclose and discuss suicidal ideas, Ability to demonstrate self-control will improve, Ability to identify and develop effective coping behaviors will improve, Ability to maintain clinical measurements within normal limits will improve, Compliance with prescribed medications will improve, and Ability to identify triggers associated with substance abuse/mental health issues will improve  I certify that inpatient services furnished can reasonably be expected to improve the patient's condition.    Maida Sale, MD 12/27/20222:22 PM

## 2021-08-26 NOTE — ED Notes (Signed)
Belongings from locker given to [facility] to go with pt.

## 2021-08-26 NOTE — BH Assessment (Addendum)
Per [facility], patient has been accepted to [facility].  NP Nira Conn accepting patient, and attending will be Dr. Mason Jim.  Pt can come after 09:00.  Please call report to [phone].   Please fax IVC papers prior to arrival.

## 2021-08-26 NOTE — BHH Suicide Risk Assessment (Signed)
[facility] Admission Suicide Risk Assessment   Nursing information obtained from:  Patient Demographic factors:  Caucasian Current Mental Status:  Suicidal ideation indicated by others Loss Factors:  Decline in physical health, Financial problems / change in socioeconomic status Historical Factors:  NA Risk Reduction Factors:  Sense of responsibility to family, Living with another person, especially a relative, Positive social support  Total Time spent with patient: 45 minutes Principal Problem: MDD (major depressive disorder), recurrent episode, severe (HCC) Diagnosis:  Principal Problem:   MDD (major depressive disorder), recurrent episode, severe (HCC) Active Problems:   Opioid dependence with withdrawal (HCC)  Subjective Data: Katelyn Smith is a 55 YO F with a history of substance use and depression who is currently depressed, recently with passive SI but not currently and able to contract for safety. She is in withdrawal from opioids. She denies hallucinations, delusions and paranoia.   Continued Clinical Symptoms:  Alcohol Use Disorder Identification Test Final Score (AUDIT): 0 The "Alcohol Use Disorders Identification Test", Guidelines for Use in Primary Care, Second Edition.  World Science writer [facility]). Score between 0-7:  no or low risk or alcohol related problems. Score between 8-15:  moderate risk of alcohol related problems. Score between 16-19:  high risk of alcohol related problems. Score 20 or above:  warrants further diagnostic evaluation for alcohol dependence and treatment.   CLINICAL FACTORS:   Depression:   Insomnia Alcohol/Substance Abuse/Dependencies Chronic Pain   Musculoskeletal: Strength & Muscle Tone: within normal limits Gait & Station: unsteady Patient leans: N/A  Psychiatric Specialty Exam:  Presentation  General Appearance: Disheveled  Eye Contact:Fleeting  Speech:Normal Rate  Speech Volume:Normal  Handedness:Right   Mood and Affect   Mood:Dysphoric  Affect:Flat   Thought Process  Thought Processes:Coherent  Descriptions of Associations:Intact  Orientation:Full (Time, Place and Person)  Thought Content:Logical  History of Schizophrenia/Schizoaffective disorder:No  Duration of Psychotic Symptoms:No data recorded Hallucinations:Hallucinations: None  Ideas of Reference:None  Suicidal Thoughts:Suicidal Thoughts: No  Homicidal Thoughts:Homicidal Thoughts: No   Sensorium  Memory:Immediate Fair  Judgment:Fair  Insight:Fair   Executive Functions  Concentration:Fair  Attention Span:Poor  Recall:Fair  Fund of Knowledge:Fair  Language:Fair   Psychomotor Activity  Psychomotor Activity:Psychomotor Activity: Decreased   Assets  Assets:Social Support; Housing; Desire for Improvement   Sleep  Sleep:Sleep: Poor    Physical Exam: Physical Exam Vitals and nursing note reviewed.  Constitutional:      Appearance: She is ill-appearing and diaphoretic.  Eyes:     Extraocular Movements: Extraocular movements intact.  Pulmonary:     Effort: Pulmonary effort is normal.  Musculoskeletal:     Cervical back: Normal range of motion.  Neurological:     Mental Status: She is alert and oriented to person, place, and time.     Motor: Tremor present.   Review of Systems  Constitutional:  Positive for chills, diaphoresis, malaise/fatigue and weight loss.  Respiratory:  Negative for cough.   Cardiovascular:  Negative for chest pain.  Gastrointestinal:  Positive for nausea. Negative for diarrhea.  Genitourinary:  Negative for dysuria.  Musculoskeletal:  Positive for joint pain and myalgias.  Neurological:  Negative for dizziness and headaches.  Psychiatric/Behavioral:  Positive for depression and substance abuse. Negative for hallucinations and suicidal ideas. The patient has insomnia.   Blood pressure (!) 155/100, pulse 86, temperature 98 F (36.7 C), temperature source Oral, resp. rate 17, height  5\' 3"  (1.6 m), weight 47.6 kg, SpO2 98 %. Body mass index is 18.6 kg/m.   COGNITIVE FEATURES THAT CONTRIBUTE  TO RISK:  Thought constriction (tunnel vision)    SUICIDE RISK:   Mild:  Suicidal ideation of limited frequency, intensity, duration, and specificity.  There are no identifiable plans, no associated intent, mild dysphoria and related symptoms, good self-control (both objective and subjective assessment), few other risk factors, and identifiable protective factors, including available and accessible social support.  PLAN OF CARE:   Safety and Monitoring --  Admission to inpatient psychiatric unit for safety, stabilization and treatment -- Daily contact with patient to assess and evaluate symptoms and progress in treatment -- Patient's case to be discussed in multi-disciplinary team meeting. -- Patient will be encouraged to participate in the therapeutic group milieu. -- Observation Level : q15 minute checks -- Vital signs:  q12 hours -- Precautions: suicide.  Plan  -Monitor Vitals. -Monitor for thoughts of harm to self or others -Monitor for psychosis, disorganization or changes to cognition -Monitor for withdrawal symptoms. -Monitor for medication side effects.  Labs/Studies: Nutrition, A1c, lipids  Medications: Bentyl for cramping, imodium for diarrhea, clonidine for cravings, remeron for sleep, naproxen for aches and pains, zofran for nausea  Nicoderm for smoking cessation Hydroxyzine for anxiety Ensure for underweight  I certify that inpatient services furnished can reasonably be expected to improve the patient's condition.   Maida Sale, MD 08/26/2021, 2:44 PM

## 2021-08-26 NOTE — ED Notes (Signed)
Attempted to call report. Westly Pam will have to call back.

## 2021-08-26 NOTE — BH Assessment (Addendum)
Comprehensive Clinical Assessment (CCA) Note  08/26/2021 BREEAN DRAVES [id number] Disposition: Clinician discussed patient care with Lindon Romp, FNP.  He recommended inpatient care for patient.  Clinician informed Dr. Betsey Holiday and RN Maci Brame via secure messaging.    Pt says she is starting to go through withdrawals.  Pt has poor eye contact but is oriented.  Patient says that she is starting to have pain from withdrawals and gives minimal information.  She is not responding to internal stimuli.  She does not evidence any delusional thought patterns.  Pt reports poor appetite and sleep due to her use of heroin.  Pt has no outpatient psychiatric care.  She has no previous inpatient care either.  Pt is interested in getting help for her SU.   Chief Complaint:  Chief Complaint  Patient presents with   IVC   Medical Clearance   Visit Diagnosis: MDD recurrent, severe; Opioid use d/o severe    CCA Screening, Triage and Referral (STR)  Patient Reported Information How did you hear about [place]? Family/Friend (Pt says her daughter brought her to the hospital.)  What Is the Reason for Your Visit/Call Today? Pt says her daughter brought her to the hospital because she was withdrawing from heroin.  Pt says she has been snorting 2-3 lines of heroin a day.  Pt cannot quantify how much.  She has been using it daily for about a year.  Pt is positive for opiates and benzos.  Pt says a friend "gave me a pill" when asked if she used xanax or ativans.  Pt says she had previous suicidal thoughts because family members found out about her heroin use.  Pt currently is denying wanting to kill herself.  Pt denies any HI or A/V hallucinations.  How Long Has This Been Causing You Problems? No data recorded What Do You Feel Would Help You the Most Today? Alcohol or Drug Use Treatment; Treatment for Depression or other mood problem   Have You Recently Had Any Thoughts About Hurting Yourself? Yes (Had previous  suicidal thoughts.)  Are You Planning to Commit Suicide/Harm Yourself At This time? No   Have you Recently Had Thoughts About [place]? No  Are You Planning to Harm Someone at This Time? No  Explanation: No data recorded  Have You Used Any Alcohol or Drugs in the Past 24 Hours? Yes  How Long Ago Did You Use Drugs or Alcohol? No data recorded What Did You Use and How Much? Used heroin prior to arrival to [facility].   Do You Currently Have a Therapist/Psychiatrist? No  Name of Therapist/Psychiatrist: No data recorded  Have You Been Recently Discharged From Any Office Practice or Programs? No data recorded Explanation of Discharge From Practice/Program: No data recorded    CCA Screening Triage Referral Assessment Type of Contact: Tele-Assessment  Telemedicine Service Delivery:   Is this Initial or Reassessment? Initial Assessment  Date Telepsych consult ordered in [facility]:  08/25/21  Time Telepsych consult ordered in [facility]:  2345  Location of Assessment: [facility]  Provider Location: [facility] Assessment Services   Collateral Involvement: No data recorded  Does Patient Have a Ashby? No data recorded Name and Contact of Legal Guardian: No data recorded If Minor and Not Living with Parent(s), Who has Custody? No data recorded Is CPS involved or ever been involved? Never  Is APS involved or ever been involved? Never   Patient Determined To Be At Risk for Harm To Self or  Others Based on Review of Patient Reported Information or Presenting Complaint? Yes, for Self-Harm  Method: No data recorded Availability of Means: No data recorded Intent: No data recorded Notification Required: No data recorded Additional Information for Danger to Others Potential: No data recorded Additional Comments for Danger to Others Potential: No data recorded Are There Guns or Other Weapons in Your Home? No data recorded Types of Guns/Weapons: No data recorded Are These  Weapons Safely Secured?                            No data recorded Who Could Verify You Are Able To Have These Secured: No data recorded Do You Have any Outstanding Charges, Pending Court Dates, Parole/Probation? No data recorded Contacted To Inform of Risk of Harm To Self or Others: No data recorded   Does Patient Present under Involuntary Commitment? Yes  IVC Papers Initial File Date: No data recorded  [place] of Residence: [place]   Patient Currently Receiving the Following Services: Not Receiving Services   Determination of Need: Urgent (48 hours)   Options For Referral: Inpatient Hospitalization     CCA Biopsychosocial Patient Reported Schizophrenia/Schizoaffective Diagnosis in Past: No   Strengths: Desires to get off the heroin.   Mental Health Symptoms Depression:   Hopelessness; Worthlessness   Duration of Depressive symptoms:  Duration of Depressive Symptoms: Less than two weeks   Mania:   None   Anxiety:    None   Psychosis:   None   Duration of Psychotic symptoms:    Trauma:   None   Obsessions:   None   Compulsions:   None   Inattention:   None   Hyperactivity/Impulsivity:   None   Oppositional/Defiant Behaviors:   None   Emotional Irregularity:   None   Other Mood/Personality Symptoms:  No data recorded   Mental Status Exam Appearance and self-care  Stature:   Average   Weight:   Thin   Clothing:   Disheveled   Grooming:   Neglected   Cosmetic use:   None   Posture/gait:  No data recorded  Motor activity:   Tremor   Sensorium  Attention:   Normal   Concentration:   Preoccupied   Orientation:   X5   Recall/memory:   Defective in Short-term   Affect and Mood  Affect:   Anxious   Mood:   Depressed; Anxious   Relating  Eye contact:   None   Facial expression:   Anxious; Sad   Attitude toward examiner:   Cooperative   Thought and Language  Speech flow:  Clear and Coherent   Thought  content:   Appropriate to Mood and Circumstances   Preoccupation:   Guilt   Hallucinations:   None   Organization:  No data recorded  [employer] of Knowledge:   Average   Intelligence:   Average   Abstraction:   Normal   Judgement:   Impaired   Reality Testing:   Adequate   Insight:   Fair   Decision Making:   Impulsive   Social Functioning  Social Maturity:   Impulsive   Social Judgement:   Normal   Stress  Stressors:   Family conflict; Illness   Coping Ability:   Overwhelmed; Exhausted   Skill Deficits:   Communication; Decision making   Supports:   Family     Religion:    Leisure/Recreation:    Exercise/Diet: Exercise/Diet Have  You Gained or Lost A Significant Amount of Weight in the Past Six Months?: Yes-Lost Number of Pounds Lost?:  ("A lot") Do You Follow a Special Diet?: No Do You Have Any Trouble Sleeping?: Yes Explanation of Sleeping Difficulties: "I can't sleep when I am hurting."   CCA Employment/Education Employment/Work Situation: Employment / Work Situation Employment Situation: Employed Patient's Job has Been Impacted by Current Illness: Yes Describe how Patient's Job has Been Impacted: When she does not have the heroin and she is sick. Has Patient ever Been in the [employer]?: No  Education: Education Is Patient Currently Attending School?: No Last Grade Completed: 12 Did You Attend College?: No   CCA Family/Childhood History Family and Relationship History: Family history Marital status: Married Number of Years Married: 46 Does patient have children?: Yes How many children?: 3  Childhood History:  Childhood History By whom was/is the patient raised?: Grandparents Did patient suffer any verbal/emotional/physical/sexual abuse as a child?: No Did patient suffer from severe childhood neglect?: No Has patient ever been sexually abused/assaulted/raped as an adolescent or adult?: No Was the  patient ever a victim of a crime or a disaster?: No Witnessed domestic violence?: No Has patient been affected by domestic violence as an adult?: No  Child/Adolescent Assessment:     CCA Substance Use Alcohol/Drug Use: Alcohol / Drug Use Pain Medications: Pt is abusing heroin. Prescriptions: None Over the Counter: None History of alcohol / drug use?: Yes Negative Consequences of Use: Personal relationships Withdrawal Symptoms: Cramps, Fever / Chills, Tremors, Weakness, Sweats, Diarrhea, Nausea / Vomiting, Patient aware of relationship between substance abuse and physical/medical complications Substance #1 Name of Substance 1: Heroin 1 - Age of First Use: 55 years of age 17 - Amount (size/oz): Unknown "2-3 lines a day" 1 - Frequency: Daily 1 - Duration: over the last year 1 - Last Use / Amount: 12/26, one hour prior to arrival 1 - Method of Aquiring: purchase illegally 1- Route of Use: nasal inhalation                       ASAM's:  Six Dimensions of Multidimensional Assessment  Dimension 1:  Acute Intoxication and/or Withdrawal Potential:      Dimension 2:  Biomedical Conditions and Complications:      Dimension 3:  Emotional, Behavioral, or Cognitive Conditions and Complications:     Dimension 4:  Readiness to Change:     Dimension 5:  Relapse, Continued use, or Continued Problem Potential:     Dimension 6:  Recovery/Living Environment:     ASAM Severity Score:    ASAM Recommended Level of Treatment:     Substance use Disorder (SUD)    Recommendations for Services/Supports/Treatments:    Discharge Disposition:    DSM5 Diagnoses: Patient Active Problem List   Diagnosis Date Noted   Small vessel disease, cerebrovascular    Heavy tobacco smoker    Hyperlipidemia LDL goal <70    Acute ischemic stroke ([place]) 06/25/2019   Stroke (cerebrum) ([place]) 06/25/2019   CAD (coronary artery disease) 09/24/2016   Unstable angina (HCC)    Chest pain 08/10/2016    Hyperlipidemia 08/10/2016   Chronic pain syndrome 08/10/2016   Tobacco abuse 08/10/2016   Hypokalemia 08/10/2016   Urge incontinence 12/15/2012   Chronic low back pain 12/15/2012     Referrals to Alternative Service(s): Referred to Alternative Service(s):   Place:   Date:   Time:    Referred to Alternative Service(s):   Place:  Date:   Time:    Referred to Alternative Service(s):   Place:   Date:   Time:    Referred to Alternative Service(s):   Place:   Date:   Time:     Wandra Mannan

## 2021-08-27 ENCOUNTER — Encounter (HOSPITAL_COMMUNITY): Payer: Self-pay

## 2021-08-27 DIAGNOSIS — E559 Vitamin D deficiency, unspecified: Secondary | ICD-10-CM | POA: Diagnosis present

## 2021-08-27 LAB — VITAMIN B12: Vitamin B-12: 260 pg/mL (ref 180–914)

## 2021-08-27 LAB — SYPHILIS: RPR W/REFLEX TO RPR TITER AND TREPONEMAL ANTIBODIES, TRADITIONAL SCREENING AND DIAGNOSIS ALGORITHM: RPR Ser Ql: NONREACTIVE

## 2021-08-27 LAB — LIPID PANEL
Cholesterol: 168 mg/dL (ref 0–200)
HDL: 40 mg/dL — ABNORMAL LOW
LDL Cholesterol: 114 mg/dL — ABNORMAL HIGH (ref 0–99)
Total CHOL/HDL Ratio: 4.2 ratio
Triglycerides: 70 mg/dL
VLDL: 14 mg/dL (ref 0–40)

## 2021-08-27 LAB — VITAMIN D 25 HYDROXY (VIT D DEFICIENCY, FRACTURES): Vit D, 25-Hydroxy: 9.97 ng/mL — ABNORMAL LOW (ref 30–100)

## 2021-08-27 LAB — HEMOGLOBIN A1C
Hgb A1c MFr Bld: 4.8 % (ref 4.8–5.6)
Mean Plasma Glucose: 91.06 mg/dL

## 2021-08-27 LAB — TSH: TSH: 0.199 u[IU]/mL — ABNORMAL LOW (ref 0.350–4.500)

## 2021-08-27 LAB — MAGNESIUM: Magnesium: 2 mg/dL (ref 1.7–2.4)

## 2021-08-27 MED ORDER — MIRTAZAPINE 15 MG PO TABS
15.0000 mg | ORAL_TABLET | Freq: Every day | ORAL | Status: DC
  Administered 2021-08-27 – 2021-08-29 (×3): 15 mg via ORAL
  Filled 2021-08-27 (×5): qty 1

## 2021-08-27 MED ORDER — ENSURE ENLIVE PO LIQD
237.0000 mL | Freq: Three times a day (TID) | ORAL | Status: DC
  Administered 2021-08-27 – 2021-08-28 (×3): 237 mL via ORAL
  Filled 2021-08-27 (×12): qty 237

## 2021-08-27 MED ORDER — BUPRENORPHINE HCL-NALOXONE HCL 2-0.5 MG SL SUBL
1.0000 | SUBLINGUAL_TABLET | Freq: Two times a day (BID) | SUBLINGUAL | Status: DC
  Administered 2021-08-27 (×2): 1 via SUBLINGUAL
  Filled 2021-08-27 (×2): qty 1

## 2021-08-27 MED ORDER — VITAMIN D (ERGOCALCIFEROL) 1.25 MG (50000 UNIT) PO CAPS
50000.0000 [IU] | ORAL_CAPSULE | ORAL | Status: DC
  Administered 2021-08-28: 11:00:00 50000 [IU] via ORAL
  Filled 2021-08-27 (×2): qty 1

## 2021-08-27 NOTE — BH IP Treatment Plan (Signed)
Interdisciplinary Treatment and Diagnostic Plan Update  08/27/2021 Time of Session: 9:15am  Katelyn Smith MRN: [id number]  Principal Diagnosis: MDD (major depressive disorder), recurrent episode, severe ([place])  Secondary Diagnoses: Principal Problem:   MDD (major depressive disorder), recurrent episode, severe ([place]) Active Problems:   Opioid dependence with withdrawal ([place])   BMI less than 19,adult   Current Medications:  Current Facility-Administered Medications  Medication Dose Route Frequency Provider Last Rate Last Admin   acetaminophen (TYLENOL) tablet 650 mg  650 mg Oral Q4H PRN Rozetta Nunnery, NP   650 mg at 08/26/21 2023   atorvastatin (LIPITOR) tablet 40 mg  40 mg Oral q1800 Lindon Romp A, NP   40 mg at 08/26/21 1655   buprenorphine-naloxone (SUBOXONE) 2-0.5 mg per SL tablet 1 tablet  1 tablet Sublingual BID Maida Sale, MD   1 tablet at 08/27/21 1145   cloNIDine (CATAPRES) tablet 0.1 mg  0.1 mg Oral QID Lindon Romp A, NP   0.1 mg at 08/27/21 1221   Followed by   Derrill Memo ON 08/28/2021] cloNIDine (CATAPRES) tablet 0.1 mg  0.1 mg Oral BID Rozetta Nunnery, NP       Followed by   Derrill Memo ON 08/30/2021] cloNIDine (CATAPRES) tablet 0.1 mg  0.1 mg Oral Daily Lindon Romp A, NP       clopidogrel (PLAVIX) tablet 75 mg  75 mg Oral Daily Lindon Romp A, NP   75 mg at 08/27/21 0900   dicyclomine (BENTYL) capsule 20 mg  20 mg Oral Q6H PRN Lindon Romp A, NP   20 mg at 08/27/21 4854   feeding supplement (ENSURE ENLIVE / ENSURE PLUS) liquid 237 mL  237 mL Oral BID BM Rosezetta Schlatter, MD   237 mL at 08/27/21 1100   hydrOXYzine (ATARAX) tablet 25 mg  25 mg Oral Q6H PRN Lindon Romp A, NP   25 mg at 08/27/21 6270   loperamide (IMODIUM) capsule 2-4 mg  2-4 mg Oral PRN Rozetta Nunnery, NP       methocarbamol (ROBAXIN) tablet 500 mg  500 mg Oral Q8H PRN Lindon Romp A, NP   500 mg at 08/27/21 3500   mirtazapine (REMERON) tablet 7.5 mg  7.5 mg Oral QHS PRN Maida Sale, MD   7.5  mg at 08/26/21 2125   naproxen (NAPROSYN) tablet 500 mg  500 mg Oral BID PRN Rozetta Nunnery, NP   500 mg at 08/27/21 0132   nicotine (NICODERM CQ - dosed in mg/24 hours) patch 14 mg  14 mg Transdermal Daily Rosezetta Schlatter, MD   14 mg at 08/27/21 0900   ondansetron (ZOFRAN-ODT) disintegrating tablet 4 mg  4 mg Oral Q6H PRN Rozetta Nunnery, NP   4 mg at 08/27/21 9381   PTA Medications: Medications Prior to Admission  Medication Sig Dispense Refill Last Dose   atorvastatin (LIPITOR) 40 MG tablet Take 1 tablet (40 mg total) by mouth daily at 6 PM. 30 tablet 3    clopidogrel (PLAVIX) 75 MG tablet Take 1 tablet (75 mg total) by mouth daily. 30 tablet 3    oxyCODONE (ROXICODONE) 15 MG immediate release tablet Take 15 mg by mouth 4 (four) times daily as needed.      Oxycodone HCl 10 MG TABS Take 10 mg by mouth every 4 (four) hours as needed for severe pain.       Patient Stressors: Marital or family conflict   Substance abuse    Patient Strengths: Motivation for treatment/growth  Supportive family/friends   Treatment Modalities: Medication Management, Group therapy, Case management,  1 to 1 session with clinician, Psychoeducation, Recreational therapy.   Physician Treatment Plan for Primary Diagnosis: MDD (major depressive disorder), recurrent episode, severe ([place]) Long Term Goal(s): Improvement in symptoms so as ready for discharge   Short Term Goals: Ability to identify changes in lifestyle to reduce recurrence of condition will improve Ability to verbalize feelings will improve Ability to disclose and discuss suicidal ideas Ability to demonstrate self-control will improve Ability to identify and develop effective coping behaviors will improve Ability to maintain clinical measurements within normal limits will improve Compliance with prescribed medications will improve Ability to identify triggers associated with substance abuse/mental health issues will improve  Medication Management:  Evaluate patient's response, side effects, and tolerance of medication regimen.  Therapeutic Interventions: 1 to 1 sessions, Unit Group sessions and Medication administration.  Evaluation of Outcomes: Not Met  Physician Treatment Plan for Secondary Diagnosis: Principal Problem:   MDD (major depressive disorder), recurrent episode, severe ([place]) Active Problems:   Opioid dependence with withdrawal ([place])   BMI less than 19,adult  Long Term Goal(s): Improvement in symptoms so as ready for discharge   Short Term Goals: Ability to identify changes in lifestyle to reduce recurrence of condition will improve Ability to verbalize feelings will improve Ability to disclose and discuss suicidal ideas Ability to demonstrate self-control will improve Ability to identify and develop effective coping behaviors will improve Ability to maintain clinical measurements within normal limits will improve Compliance with prescribed medications will improve Ability to identify triggers associated with substance abuse/mental health issues will improve     Medication Management: Evaluate patient's response, side effects, and tolerance of medication regimen.  Therapeutic Interventions: 1 to 1 sessions, Unit Group sessions and Medication administration.  Evaluation of Outcomes: Not Met   RN Treatment Plan for Primary Diagnosis: MDD (major depressive disorder), recurrent episode, severe ([place]) Long Term Goal(s): Knowledge of disease and therapeutic regimen to maintain health will improve  Short Term Goals: Ability to remain free from injury will improve, Ability to participate in decision making will improve, Ability to verbalize feelings will improve, Ability to disclose and discuss suicidal ideas, and Ability to identify and develop effective coping behaviors will improve  Medication Management: RN will administer medications as ordered by provider, will assess and evaluate patient's response and provide  education to patient for prescribed medication. RN will report any adverse and/or side effects to prescribing provider.  Therapeutic Interventions: 1 on 1 counseling sessions, Psychoeducation, Medication administration, Evaluate responses to treatment, Monitor vital signs and CBGs as ordered, Perform/monitor CIWA, COWS, AIMS and Fall Risk screenings as ordered, Perform wound care treatments as ordered.  Evaluation of Outcomes: Not Met   LCSW Treatment Plan for Primary Diagnosis: MDD (major depressive disorder), recurrent episode, severe ([place]) Long Term Goal(s): Safe transition to appropriate next level of care at discharge, Engage patient in therapeutic group addressing interpersonal concerns.  Short Term Goals: Engage patient in aftercare planning with referrals and resources, Increase social support, Increase emotional regulation, Facilitate acceptance of mental health diagnosis and concerns, Identify triggers associated with mental health/substance abuse issues, and Increase skills for wellness and recovery  Therapeutic Interventions: Assess for all discharge needs, 1 to 1 time with Social worker, Explore available resources and support systems, Assess for adequacy in community support network, Educate family and significant other(s) on suicide prevention, Complete Psychosocial Assessment, Interpersonal group therapy.  Evaluation of Outcomes: Not Met   Progress in Treatment:  Attending groups: Yes. Participating in groups: Yes. Taking medication as prescribed: Yes. Toleration medication: Yes. Family/Significant other contact made: Yes, individual(s) contacted:  If consents are provided Patient understands diagnosis: Yes. Discussing patient identified problems/goals with staff: Yes. Medical problems stabilized or resolved: Yes. Denies suicidal/homicidal ideation: Yes. Issues/concerns per patient self-inventory: No.   New problem(s) identified: No, Describe:  None   New Short  Term/Long Term Goal(s): medication stabilization, elimination of SI thoughts, development of comprehensive mental wellness plan.   Patient Goals:  Did not attend   Discharge Plan or Barriers: Patient recently admitted. CSW will continue to follow and assess for appropriate referrals and possible discharge planning.   Reason for Continuation of Hospitalization: Depression Medication stabilization Suicidal ideation Withdrawal symptoms  Estimated Length of Stay: 3 to 5 days    Scribe for Treatment Team: Darleen Crocker, Latanya Presser 08/27/2021 12:50 PM

## 2021-08-27 NOTE — Progress Notes (Signed)
°  MHT advised that pt became lightheaded after labs were drawn. Hydrated pt with 1 cup of water and a pitcher of Gatorade.  Pt was advised to rise slowly when standing and to sit slowly if at any point she feels dizzy while ambulating.  Will notify day RN.

## 2021-08-27 NOTE — Plan of Care (Signed)
Nurse discussed coping skills with patient.  

## 2021-08-27 NOTE — Progress Notes (Addendum)
Pt observed on phone with family, in day room briefly, but in room mostly.  Pt reports that she still did not feel like coming out much today.  Pt asked, "Since I IVC'd myself in here, can I sign myself out?"  Pt was informed to speak with doctor concerning next steps.  Pt reports high anxiety related to getting back to her grandkids.  Pt denies SI/HI and verbally contracts for safety.  Pt denies AVH.  Administered PRNs for pain and muscle spasms/cramps for pt's lower back and leg pain ranging  7-10/10 to include PRN (Tylenol, Bentyl, Robaxin, Naproxen) and PRN Hydroxyzine for anxiety per [facility] per pt request.  Pt is safe on unit with Q 15 minute safety checks.   08/27/21 2119  Psych Admission Type (Psych Patients Only)  Admission Status Involuntary  Psychosocial Assessment  Patient Complaints Anxiety  Eye Contact Brief  Facial Expression Grimacing;Sad  Affect Anxious  Speech Logical/coherent  Interaction Forwards little;Needy  Motor Activity Unsteady;Tremors;Slow  Appearance/Hygiene Unremarkable  Behavior Characteristics Cooperative;Anxious  Mood Depressed;Anxious  Thought Process  Coherency WDL  Content WDL  Delusions None reported or observed  Perception WDL  Hallucination None reported or observed  Judgment Impaired  Confusion None  Danger to Self  Current suicidal ideation? Denies  Danger to Others  Danger to Others None reported or observed

## 2021-08-27 NOTE — Progress Notes (Signed)
NUTRITION ASSESSMENT  Pt identified as at risk on the Malnutrition Screen Tool  INTERVENTION: 1. Supplements: Ensure Enlive po TID, each supplement provides 350 kcal and 20 grams of protein   NUTRITION DIAGNOSIS: Unintentional weight loss related to sub-optimal intake as evidenced by pt report.   Goal: Pt to meet >/= 90% of their estimated nutrition needs.  Monitor:  PO intake  Assessment:  Pt admitted with MDD and opioid withdrawal. Last used heroin 12/25.  Per weight records, pt's weight has been trending down since 2018-2019. Ensure supplements have been ordered, will increase to TID.   Height: Ht Readings from Last 1 Encounters:  08/26/21 5\' 3"  (1.6 m)    Weight: Wt Readings from Last 1 Encounters:  08/26/21 47.6 kg    Weight Hx: Wt Readings from Last 10 Encounters:  08/26/21 47.6 kg  08/25/21 49.4 kg  03/11/20 45.4 kg  06/27/19 51.9 kg  04/18/18 50.8 kg  12/03/17 54 kg  06/15/17 56.7 kg  09/24/16 54 kg  09/17/16 54.4 kg  09/09/16 55.3 kg    BMI:  Body mass index is 18.6 kg/m. Pt meets criteria for normal -borderline underweight based on current BMI.  Estimated Nutritional Needs: Kcal: 25-30 kcal/kg Protein: > 1 gram protein/kg Fluid: 1 ml/kcal  Diet Order:  Diet Order             Diet regular Room service appropriate? Yes; Fluid consistency: Thin  Diet effective now                  Pt is also offered choice of unit snacks mid-morning and mid-afternoon.  Pt is eating as desired.   Lab results and medications reviewed.   11/07/16, MS, RD, LDN Inpatient Clinical Dietitian Contact information available via Amion

## 2021-08-27 NOTE — Plan of Care (Signed)
  Problem: Education: Goal: Emotional status will improve Outcome: Not Progressing Goal: Mental status will improve Outcome: Not Progressing Goal: Verbalization of understanding the information provided will improve Outcome: Not Progressing   

## 2021-08-27 NOTE — Group Note (Signed)
LCSW Group Note   LATAYSHA VOHRA [id number] DATE : 08/27/2021  TIME: 2:08 PM  Type of Group and Topic: Psychoeducational Group: Discharge Planning   Participation Level: Did Not Attend   Description of Group   Discharge planning group reviews patients anticipated discharge plans and assists patients to anticipate and address any barriers to wellness/recovery in the community. Suicide prevention education is reviewed with patients in group.   Therapeutic Goals   1. Patients will state their anticipated discharge plan and mental health aftercare   2. Patients will identify potential barriers to wellness in the community setting   3. Patients will engage in problem solving, solution focused discussion of ways to anticipate and address barriers to wellness/recovery   Summary of Patient Progress: Patient did not attend group despite encouraged participation.   Therapeutic Modalities:   Motivational Interviewing  Signed:  Corky Crafts, MSW, [place], LCASA 08/27/2021 2:11 PM

## 2021-08-27 NOTE — Progress Notes (Signed)
Psychoeducational Group Note  Date:  08/27/2021 Time:  2047  Group Topic/Focus:  Wrap-Up Group:   The focus of this group is to help patients review their daily goal of treatment and discuss progress on daily workbooks.  Participation Level: Did Not Attend  Participation Quality:  Not Applicable  Affect:  Not Applicable  Cognitive:  Not Applicable  Insight:  Not Applicable  Engagement in Group: Not Applicable  Additional Comments:  The patient did not attend group this evening.   Hazle Coca S 08/27/2021, 8:49 PM

## 2021-08-27 NOTE — Progress Notes (Addendum)
Pt presents with high anxiety and muscle pain/spasms throughout the evening with a rate of 8-9/10.  Pt is withdrawing with a COW score of 8.  Pt denies SI/HI and AVH.  Pt verbally  contracts for safety.    PRN Tylenol, Bentyl, Hydroxyzine, Robaxin, Remeron, and Naproxen for pain, anxiety, muscle and abdominal aches aches and the sleep per the [facility] per pt request.  Pt is safe on unit with Q 15 minute safety checks.    08/26/21 2023  Psych Admission Type (Psych Patients Only)  Admission Status Involuntary  Psychosocial Assessment  Patient Complaints Anxiety;Other (Comment) (pt stated, "on edge")  Eye Contact Brief  Facial Expression Grimacing;Sad  Affect Anxious  Speech Logical/coherent  Interaction Forwards little;Needy  Motor Activity Unsteady;Tremors;Slow  Appearance/Hygiene In scrubs  Behavior Characteristics Cooperative;Anxious  Mood Depressed;Anxious  Thought Process  Coherency WDL  Content WDL  Delusions None reported or observed  Perception WDL  Hallucination None reported or observed  Judgment Impaired  Confusion None  Danger to Self  Current suicidal ideation? Denies  Danger to Others  Danger to Others None reported or observed

## 2021-08-27 NOTE — Group Note (Signed)
Recreation Therapy Group Note   Group Topic:Stress Management  Group Date: 08/27/2021 Start Time: 0930 End Time: 0945 Facilitators: Caroll Rancher, LRT,CTRS Location: [address] Dayroom   Goal Area(s) Addresses:  Patient will actively participate in stress management techniques presented during session.  Patient will successfully identify benefit of practicing stress management post d/c.    Group Description: Guided Imagery. LRT provided education, instruction, and demonstration on practice of visualization via guided imagery. Patient was asked to participate in the technique introduced during session. LRT debriefed including topics of mindfulness, stress management and specific scenarios each patient could use these techniques. Patients were given suggestions of ways to access scripts post d/c and encouraged to explore Youtube and other apps available on smartphones, tablets, and computers.   Affect/Mood: N/A   Participation Level: Did not attend    Clinical Observations/Individualized Feedback:     Plan: Continue to engage patient in RT group sessions 2-3x/week.   Caroll Rancher, LRT,CTRS 08/27/2021 11:28 AM

## 2021-08-27 NOTE — Progress Notes (Signed)
[facility] MD Progress Note  08/27/2021 2:24 PM Katelyn Smith  MRN:  [id number] Reason for admission: Patient is a 55 year old female with history of substance use and depression presenting due to heroin overdose and passive SI. Principal Problem: MDD (major depressive disorder), recurrent episode, severe (HCC) Diagnosis: Principal Problem:   MDD (major depressive disorder), recurrent episode, severe (HCC) Active Problems:   Opioid dependence with withdrawal (HCC)   BMI less than 19,adult  Total Time spent with patient: 30 minutes  Past Psychiatric History: see H&P  Subjective Patient was seen and assessed with attending Dr. Loleta Chance this morning at bedside.  Patient reports to significant withdrawal symptoms from heroin.  Patient reports she is unable to tolerate p.o. intake at this time.  Patient endorses nausea, headaches, tremors.  Patient reports feeling anxious as well as depressed as she goes through opiate withdrawal.  Patient reports she is not sleeping well.  Encouraged patient to continue p.o. intake at this time but we would also adjust medications in order to better account for patient's symptomology.  Patient denies present SI/HI/AVH.  Patient reports she has not had a bowel movement but reports that this is likely due to not having a great deal of p.o. intake at this time.  Discussed plan with patient to start Suboxone in order to manage patient's current opiate withdrawal as she is not tolerating p.o. intake as well as having a less than 19 BMI.  Discussed that she would not be leaving on Suboxone but that it would be used currently managing her withdrawal symptoms.  In addition, discussed that we would highly encourage her to continue drinking Ensure and fluids in order to avoid requiring emergency medical attention at this time.  Patient verbalized agreement and had no other questions at this time.  Past Medical History:  Past Medical History:  Diagnosis Date   Anxiety    Chest  pain    a. 07/2016: Stress test showing no evidence of ischemia. b. cath 08/2016: 40% Ost RCA stenosis, 70% Lateral 3rd Mrg, and 80% Acute Mrg (too small for intervention, therefore medical therapy recommended).    Chronic back pain    Chronic neck pain    Depression    H/O degenerative disc disease    High cholesterol    Lumbar radiculopathy, right    Stroke (HCC) 10/2019   Tobacco abuse     Past Surgical History:  Procedure Laterality Date   BACK SURGERY     CARDIAC CATHETERIZATION N/A 09/09/2016   Procedure: Left Heart Cath and Coronary Angiography;  Surgeon: Peter M Swaziland, MD;  Location: [facility] INVASIVE CV LAB;  Service: Cardiovascular;  Laterality: N/A;   CARDIAC CATHETERIZATION  09/09/2016   TUBAL LIGATION     Family History:  Family History  Problem Relation Age of Onset   Heart failure Mother    Heart disease Mother 48       first MI early 5s   Cancer Father        Died at 82   Cancer Sister    Stroke Other    Heart disease Other    Stroke Maternal Grandmother    Family Psychiatric  History: see H&P Social History:  Social History   Substance and Sexual Activity  Alcohol Use No     Social History   Substance and Sexual Activity  Drug Use No    Social History   Socioeconomic History   Marital status: Married    Spouse name: Yenesis Even  Number of children: 3   Years of education: Not on file   Highest education level: 12th grade  Occupational History   Occupation: Cook  Tobacco Use   Smoking status: Every Day    Packs/day: 1.00    Years: 30.00    Pack years: 30.00    Types: Cigarettes   Smokeless tobacco: Never   Tobacco comments:    Since age 49  Vaping Use   Vaping Use: Some days  Substance and Sexual Activity   Alcohol use: No   Drug use: No   Sexual activity: Yes    Birth control/protection: Surgical  Other Topics Concern   Not on file  Social History Narrative   Not on file   Social Determinants of Health   Financial Resource  Strain: Not on file  Food Insecurity: Not on file  Transportation Needs: Not on file  Physical Activity: Not on file  Stress: Not on file  Social Connections: Not on file   Additional Social History:                         Sleep: Fair  Appetite:  Fair  Current Medications: Current Facility-Administered Medications  Medication Dose Route Frequency Provider Last Rate Last Admin   acetaminophen (TYLENOL) tablet 650 mg  650 mg Oral Q4H PRN Nira Conn A, NP   650 mg at 08/26/21 2023   atorvastatin (LIPITOR) tablet 40 mg  40 mg Oral q1800 Nira Conn A, NP   40 mg at 08/26/21 1655   buprenorphine-naloxone (SUBOXONE) 2-0.5 mg per SL tablet 1 tablet  1 tablet Sublingual BID Roselle Locus, MD   1 tablet at 08/27/21 1145   cloNIDine (CATAPRES) tablet 0.1 mg  0.1 mg Oral QID Nira Conn A, NP   0.1 mg at 08/27/21 1221   Followed by   Melene Muller ON 08/28/2021] cloNIDine (CATAPRES) tablet 0.1 mg  0.1 mg Oral BID Nira Conn A, NP       Followed by   Melene Muller ON 08/30/2021] cloNIDine (CATAPRES) tablet 0.1 mg  0.1 mg Oral Daily Nira Conn A, NP       clopidogrel (PLAVIX) tablet 75 mg  75 mg Oral Daily Nira Conn A, NP   75 mg at 08/27/21 0900   dicyclomine (BENTYL) capsule 20 mg  20 mg Oral Q6H PRN Nira Conn A, NP   20 mg at 08/27/21 0927   feeding supplement (ENSURE ENLIVE / ENSURE PLUS) liquid 237 mL  237 mL Oral TID BM Mason Jim, Amy E, MD   237 mL at 08/27/21 1416   hydrOXYzine (ATARAX) tablet 25 mg  25 mg Oral Q6H PRN Nira Conn A, NP   25 mg at 08/27/21 5643   loperamide (IMODIUM) capsule 2-4 mg  2-4 mg Oral PRN Jackelyn Poling, NP       methocarbamol (ROBAXIN) tablet 500 mg  500 mg Oral Q8H PRN Nira Conn A, NP   500 mg at 08/27/21 3295   mirtazapine (REMERON) tablet 7.5 mg  7.5 mg Oral QHS PRN Roselle Locus, MD   7.5 mg at 08/26/21 2125   naproxen (NAPROSYN) tablet 500 mg  500 mg Oral BID PRN Nira Conn A, NP   500 mg at 08/27/21 0132   nicotine  (NICODERM CQ - dosed in mg/24 hours) patch 14 mg  14 mg Transdermal Daily Lamar Sprinkles, MD   14 mg at 08/27/21 0900   ondansetron (ZOFRAN-ODT) disintegrating tablet 4 mg  4 mg Oral Q6H PRN Jackelyn Poling, NP   4 mg at 08/27/21 1610    Lab Results:  Results for orders placed or performed during the hospital encounter of 08/26/21 (from the past 48 hour(s))  Magnesium     Status: None   Collection Time: 08/27/21  6:23 AM  Result Value Ref Range   Magnesium 2.0 1.7 - 2.4 mg/dL    Comment: Performed at [facility], [address] [place], [place] [address]  Vitamin B12     Status: None   Collection Time: 08/27/21  6:23 AM  Result Value Ref Range   Vitamin B-12 260 180 - 914 pg/mL    Comment: (NOTE) This assay is not validated for testing neonatal or myeloproliferative syndrome specimens for Vitamin B12 levels. Performed at [facility], [address] [place], [place] [address]   VITAMIN D 25 Hydroxy (Vit-D Deficiency, Fractures)     Status: Abnormal   Collection Time: 08/27/21  6:23 AM  Result Value Ref Range   Vit D, 25-Hydroxy 9.97 (L) 30 - 100 ng/mL    Comment: (NOTE) Vitamin D deficiency has been defined by the Institute of Medicine  and an Endocrine Society practice guideline as a level of serum 25-OH  vitamin D less than 20 ng/mL (1,2). The Endocrine Society went on to  further define vitamin D insufficiency as a level between 21 and 29  ng/mL (2).  1. IOM (Institute of Medicine). 2010. Dietary reference intakes for  calcium and D. [place]: [employer]. 2. Holick MF, Binkley Jardine, Bischoff-Ferrari HA, et al. Evaluation,  treatment, and prevention of vitamin D deficiency: an Endocrine  Society clinical practice guideline, JCEM. 2011 Jul; 96(7): 1911-30.  Performed at [facility], [address] [place], [place] [address]   RPR     Status: None   Collection Time: 08/27/21  6:23 AM  Result Value Ref  Range   RPR Ser Ql NON REACTIVE NON REACTIVE    Comment: Performed at [facility], [address], [place], [place] [address]  TSH     Status: Abnormal   Collection Time: 08/27/21  6:23 AM  Result Value Ref Range   TSH 0.199 (L) 0.350 - 4.500 uIU/mL    Comment: Performed by a 3rd Generation assay with a functional sensitivity of <=0.01 uIU/mL. Performed at [facility], [address], [place], [place] [address]   Lipid panel     Status: Abnormal   Collection Time: 08/27/21  6:23 AM  Result Value Ref Range   Cholesterol 168 0 - 200 mg/dL   Triglycerides 70 <657 mg/dL   HDL 40 (L) >84 mg/dL   Total CHOL/HDL Ratio 4.2 RATIO   VLDL 14 0 - 40 mg/dL   LDL Cholesterol 696 (H) 0 - 99 mg/dL    Comment:        Total Cholesterol/HDL:CHD Risk Coronary Heart Disease Risk Table                     Men   Women  1/2 Average Risk   3.4   3.3  Average Risk       5.0   4.4  2 X Average Risk   9.6   7.1  3 X Average Risk  23.4   11.0        Use the calculated Patient Ratio above and the CHD Risk Table to determine the patient's  CHD Risk.        ATP III CLASSIFICATION (LDL):  <100     mg/dL   Optimal  657-846  mg/dL   Near or Above                    Optimal  130-159  mg/dL   Borderline  962-952  mg/dL   High  >841     mg/dL   Very High Performed at [facility], [address], [place], [place] [address]   Hemoglobin A1c     Status: None   Collection Time: 08/27/21  6:23 AM  Result Value Ref Range   Hgb A1c MFr Bld 4.8 4.8 - 5.6 %    Comment: (NOTE) Pre diabetes:          5.7%-6.4%  Diabetes:              >6.4%  Glycemic control for   <7.0% adults with diabetes    Mean Plasma Glucose 91.06 mg/dL    Comment: Performed at [facility], [address] [place], [place] [address]    Blood Alcohol level:  Lab Results  Component Value Date   ETH <10 08/25/2021    Metabolic Disorder Labs: Lab Results  Component Value Date    HGBA1C 4.8 08/27/2021   MPG 91.06 08/27/2021   MPG 114.02 06/26/2019   No results found for: PROLACTIN Lab Results  Component Value Date   CHOL 168 08/27/2021   TRIG 70 08/27/2021   HDL 40 (L) 08/27/2021   CHOLHDL 4.2 08/27/2021   VLDL 14 08/27/2021   LDLCALC 114 (H) 08/27/2021   LDLCALC 92 06/26/2019    Physical Findings: AIMS: Facial and Oral Movements Muscles of Facial Expression: None, normal Lips and Perioral Area: None, normal Jaw: None, normal Tongue: None, normal,Extremity Movements Upper (arms, wrists, hands, fingers): None, normal Lower (legs, knees, ankles, toes): None, normal, Trunk Movements Neck, shoulders, hips: None, normal, Overall Severity Severity of abnormal movements (highest score from questions above): None, normal Incapacitation due to abnormal movements: None, normal Patient's awareness of abnormal movements (rate only patient's report): No Awareness, Dental Status Current problems with teeth and/or dentures?: No Does patient usually wear dentures?: No  CIWA:    COWS:  COWS Total Score: 8  Musculoskeletal: Strength & Muscle Tone: Unable to assess as patient was lying in bed Gait & Station: Unable to assess as patient was lying in bed Patient leans: Unable to assess as patient was lying in bed  Psychiatric Specialty Exam:  Presentation  General Appearance: Disheveled  Eye Contact:Fleeting  Speech:Normal Rate  Speech Volume:Normal  Handedness:Right   Mood and Affect  Mood: depressed Affect:Flat   Thought Process  Thought Processes:Coherent  Descriptions of Associations:Intact  Orientation:Full (Time, Place and Person)  Thought Content:Logical  History of Schizophrenia/Schizoaffective disorder: No Duration of Psychotic Symptoms: denies Hallucinations:Hallucinations: None  Ideas of Reference:None  Suicidal Thoughts:Suicidal Thoughts: No  Homicidal Thoughts:Homicidal Thoughts: No   Sensorium  Memory:Immediate  Fair  Judgment:Fair  Insight:Fair   Executive Functions  Concentration:Fair  Attention Span:Poor  Recall:Fair  Fund of Knowledge:Fair  Language:Fair   Psychomotor Activity  Psychomotor Activity:Psychomotor Activity: Decreased   Assets  Assets:Social Support; Housing; Desire for Improvement   Sleep  Sleep:Sleep: Poor    Physical Exam: Physical Exam Vitals and nursing note reviewed.  Constitutional:      Appearance: Normal appearance. She is normal weight.  HENT:     Head: Normocephalic and atraumatic.  Pulmonary:     Effort: Pulmonary effort is normal.  Neurological:     General: No focal deficit present.     Mental Status: She is oriented to person, place, and time.   Review of Systems  Respiratory:  Negative for shortness of breath.   Cardiovascular:  Negative for chest pain.  Gastrointestinal:  Negative for abdominal pain, constipation, diarrhea, heartburn, nausea and vomiting.  Neurological:  Negative for headaches.  Blood pressure 115/75, pulse 78, temperature 98 F (36.7 C), temperature source Oral, resp. rate 18, height  (1.6 m), weight 47.6 kg, SpO2 98 %. Body mass index is 18.6 kg/m.   Treatment Plan Summary: Daily contact with patient to assess and evaluate symptoms and progress in treatment and Medication management ASSESSMENT  Patient is a 55 year old female with history of substance use and depression presenting due to heroin overdose and passive SI.  PLAN Safety and Monitoring: Voluntary admission to inpatient psychiatric unit for safety, stabilization and treatment Daily contact with patient to assess and evaluate symptoms and progress in treatment Patient's case to be discussed in multi-disciplinary team meeting Observation Level : q15 minute checks Vital signs: q12 hours Precautions: suicide, elopement, and assault   Psychiatric Problems Opiate use disorder-severe Major depressive disorder-recurrent episode, severe without  psychotic features -START Suboxone 2-0.5 mg twice daily for patient's opiate withdrawal -Continue clonidine taper for opiate withdrawal -We will discuss starting an antidepressant once patient is not as severely withdrawing from opiates -NRT for nicotine use disorder -Ensure 3 times daily for nutritional support   Medical Problems Hypercholesteremia -Continue Lipitor 40 mg  History of stroke -Continue home med Plavix 75 mg daily   PRNs Tylenol 650 mg for mild pain Maalox/Mylanta 30 mL for indigestion Hydroxyzine 25 mg tid for anxiety Milk of Magnesia 30 mL for constipation Remeron 7.5 mg for sleep Bentyl 20 mg for abdominal cramping Imodium 2 to 4 mg as needed for diarrhea Robaxin 500 mg for muscle spasms Naproxen 500 mg for aching pain Zofran 4 mg for nausea   4. Discharge Planning: Social work and case management to assist with discharge planning and identification of hospital follow-up needs prior to discharge Estimated LOS: 5-7 days Discharge Concerns: Need to establish a safety plan; Medication compliance and effectiveness Discharge Goals: Return home with outpatient referrals for mental health follow-up including medication management/psychotherapy  Park Pope, MD 08/27/2021, 2:24 PM

## 2021-08-27 NOTE — Progress Notes (Signed)
D:  Patient has been in bed most of the day.  Continues to ask for detox medication, etc.  PRN's have been given to her throughout the day. A:  Medications administered per MD orders.  Emotional support and encouragement given patient. R:  Denied SI and HI, contracts for safety.  Denied A/V hallucinations.  Safety maintained with 15 minute checks.

## 2021-08-27 NOTE — [facility] Group Notes (Signed)
The focus of this group is to help patients establish daily goals to achieve during treatment and discuss how the patient can incorporate goal setting into their daily lives to aide in recovery.  Pt did not attend 

## 2021-08-28 LAB — T4, FREE: Free T4: 1.09 ng/dL (ref 0.61–1.12)

## 2021-08-28 MED ORDER — WHITE PETROLATUM EX OINT
TOPICAL_OINTMENT | CUTANEOUS | Status: AC
  Administered 2021-08-28: 1
  Filled 2021-08-28: qty 5

## 2021-08-28 MED ORDER — BUPRENORPHINE HCL-NALOXONE HCL 2-0.5 MG SL SUBL
1.0000 | SUBLINGUAL_TABLET | Freq: Every day | SUBLINGUAL | Status: DC
  Administered 2021-08-28 – 2021-08-29 (×2): 1 via SUBLINGUAL
  Filled 2021-08-28 (×2): qty 1

## 2021-08-28 NOTE — Plan of Care (Signed)
°  Problem: Education: °Goal: Emotional status will improve °Outcome: Progressing °Goal: Mental status will improve °Outcome: Progressing °  °Problem: Activity: °Goal: Interest or engagement in activities will improve °Outcome: Progressing °  °

## 2021-08-28 NOTE — Progress Notes (Addendum)
°  On assessment, pt is moderately anxious and depressed.  Pt is more visible in the dayroom and on the phone, isolating less in her room.  Pt reports pain in her legs and back 7/10.  Pt denies SI/HI and verbally contracts for safety.  Pt denies AVH.  Administered PRNs (Hydroxyzine, Robaxin, Naproxen) for anxiety and muscle pain.  Pt refused Ensure that was ordered.  Pt remains safe on unit with  Q 15 minute safety checks.   08/28/21 2112  Psych Admission Type (Psych Patients Only)  Admission Status Involuntary  Psychosocial Assessment  Patient Complaints Anxiety;Depression  Eye Contact Brief  Facial Expression Grimacing;Sad  Affect Anxious  Speech Logical/coherent  Interaction Forwards little;Needy  Motor Activity Unsteady;Tremors;Slow  Appearance/Hygiene Unremarkable  Behavior Characteristics Cooperative;Anxious  Mood Depressed;Anxious  Thought Process  Coherency WDL  Content WDL  Delusions None reported or observed  Perception WDL  Hallucination None reported or observed  Judgment Impaired  Confusion None  Danger to Self  Current suicidal ideation? Denies  Danger to Others  Danger to Others None reported or observed

## 2021-08-28 NOTE — Progress Notes (Signed)
[facility] MD Progress Note  08/28/2021 10:58 AM Katelyn Smith  MRN:  [id number] Reason for admission: Patient is a 55 year old female with history of substance use and depression presenting due to heroin overdose and passive SI. Principal Problem: MDD (major depressive disorder), recurrent episode, severe (HCC) Diagnosis: Principal Problem:   MDD (major depressive disorder), recurrent episode, severe (HCC) Active Problems:   Opioid dependence with withdrawal (HCC)   BMI less than 19,adult   Vitamin D deficiency  Total Time spent with patient: 30 minutes  Past Psychiatric History: see H&P  Subjective Patient was seen and assessed with attending Dr. Loleta Chance this morning at bedside.  Patient reports to still having significant withdrawal symptoms from heroin.  Patient reports she is unable to tolerate p.o. intake at this time.  Patient endorses nausea, headaches, tremors.  Patient reports feeling anxious as well as depressed as she goes through opiate withdrawal. Patient wants to ensure withdrawal  Patient reports she is not sleeping well.  Encouraged patient to continue p.o. intake at this time but we would also adjust medications in order to better account for patient's symptomology.  Patient denies present SI/HI/AVH.  Patient reports she has not had a bowel movement but reports that this is likely due to not having a great deal of p.o. intake at this time.   Discussed plan with patient to continue Suboxone in order to manage patient's current opiate withdrawal as she is not tolerating p.o. intake as well as having a less than 19 BMI.  Discussed that she would not be leaving on Suboxone but that it would be used currently managing her withdrawal symptoms.  In addition, discussed that we would highly encourage her to continue drinking Ensure and fluids in order to avoid requiring emergency medical attention at this time.  Patient verbalized agreement and had no other questions at this time.  Past  Medical History:  Past Medical History:  Diagnosis Date   Anxiety    Chest pain    a. 07/2016: Stress test showing no evidence of ischemia. b. cath 08/2016: 40% Ost RCA stenosis, 70% Lateral 3rd Mrg, and 80% Acute Mrg (too small for intervention, therefore medical therapy recommended).    Chronic back pain    Chronic neck pain    Depression    H/O degenerative disc disease    High cholesterol    Lumbar radiculopathy, right    Stroke (HCC) 10/2019   Tobacco abuse     Past Surgical History:  Procedure Laterality Date   BACK SURGERY     CARDIAC CATHETERIZATION N/A 09/09/2016   Procedure: Left Heart Cath and Coronary Angiography;  Surgeon: Peter M Swaziland, MD;  Location: [facility] INVASIVE CV LAB;  Service: Cardiovascular;  Laterality: N/A;   CARDIAC CATHETERIZATION  09/09/2016   TUBAL LIGATION     Family History:  Family History  Problem Relation Age of Onset   Heart failure Mother    Heart disease Mother 4       first MI early 43s   Cancer Father        Died at 41   Cancer Sister    Stroke Other    Heart disease Other    Stroke Maternal Grandmother    Family Psychiatric  History: see H&P Social History:  Social History   Substance and Sexual Activity  Alcohol Use No     Social History   Substance and Sexual Activity  Drug Use No    Social History   Socioeconomic History  Marital status: Married    Spouse name: Aribella Vavra   Number of children: 3   Years of education: Not on file   Highest education level: 12th grade  Occupational History   Occupation: Cook  Tobacco Use   Smoking status: Every Day    Packs/day: 1.00    Years: 30.00    Pack years: 30.00    Types: Cigarettes   Smokeless tobacco: Never   Tobacco comments:    Since age 30  Vaping Use   Vaping Use: Some days  Substance and Sexual Activity   Alcohol use: No   Drug use: No   Sexual activity: Yes    Birth control/protection: Surgical  Other Topics Concern   Not on file  Social History  Narrative   Not on file   Social Determinants of Health   Financial Resource Strain: Not on file  Food Insecurity: Not on file  Transportation Needs: Not on file  Physical Activity: Not on file  Stress: Not on file  Social Connections: Not on file   Additional Social History:                         Sleep: Fair  Appetite:  Fair  Current Medications: Current Facility-Administered Medications  Medication Dose Route Frequency Provider Last Rate Last Admin   acetaminophen (TYLENOL) tablet 650 mg  650 mg Oral Q4H PRN Nira Conn A, NP   650 mg at 08/28/21 0405   atorvastatin (LIPITOR) tablet 40 mg  40 mg Oral q1800 Nira Conn A, NP   40 mg at 08/27/21 1819   buprenorphine-naloxone (SUBOXONE) 2-0.5 mg per SL tablet 1 tablet  1 tablet Sublingual Daily Park Pope, MD   1 tablet at 08/28/21 0815   cloNIDine (CATAPRES) tablet 0.1 mg  0.1 mg Oral BID Nira Conn A, NP   0.1 mg at 08/28/21 0815   Followed by   Melene Muller ON 08/30/2021] cloNIDine (CATAPRES) tablet 0.1 mg  0.1 mg Oral Daily Nira Conn A, NP       clopidogrel (PLAVIX) tablet 75 mg  75 mg Oral Daily Nira Conn A, NP   75 mg at 08/28/21 0815   dicyclomine (BENTYL) capsule 20 mg  20 mg Oral Q6H PRN Nira Conn A, NP   20 mg at 08/27/21 2119   feeding supplement (ENSURE ENLIVE / ENSURE PLUS) liquid 237 mL  237 mL Oral TID BM Mason Jim, Amy E, MD   237 mL at 08/27/21 2104   hydrOXYzine (ATARAX) tablet 25 mg  25 mg Oral Q6H PRN Nira Conn A, NP   25 mg at 08/27/21 2118   loperamide (IMODIUM) capsule 2-4 mg  2-4 mg Oral PRN Jackelyn Poling, NP       methocarbamol (ROBAXIN) tablet 500 mg  500 mg Oral Q8H PRN Nira Conn A, NP   500 mg at 08/28/21 0814   mirtazapine (REMERON) tablet 15 mg  15 mg Oral Seabron Spates, MD   15 mg at 08/27/21 2119   naproxen (NAPROSYN) tablet 500 mg  500 mg Oral BID PRN Nira Conn A, NP   500 mg at 08/28/21 0005   nicotine (NICODERM CQ - dosed in mg/24 hours) patch 14 mg  14 mg Transdermal  Daily Lamar Sprinkles, MD   14 mg at 08/28/21 0816   ondansetron (ZOFRAN-ODT) disintegrating tablet 4 mg  4 mg Oral Q6H PRN Jackelyn Poling, NP   4 mg at 08/28/21 [phone]  Vitamin D (Ergocalciferol) (DRISDOL) capsule 50,000 Units  50,000 Units Oral Q7 days Hill, Shelbie Hutching, MD        Lab Results:  Results for orders placed or performed during the hospital encounter of 08/26/21 (from the past 48 hour(s))  Magnesium     Status: None   Collection Time: 08/27/21  6:23 AM  Result Value Ref Range   Magnesium 2.0 1.7 - 2.4 mg/dL    Comment: Performed at [facility], [address] [place], [place] [address]  Vitamin B12     Status: None   Collection Time: 08/27/21  6:23 AM  Result Value Ref Range   Vitamin B-12 260 180 - 914 pg/mL    Comment: (NOTE) This assay is not validated for testing neonatal or myeloproliferative syndrome specimens for Vitamin B12 levels. Performed at [facility], [address], [place], [place] [address]   VITAMIN D 25 Hydroxy (Vit-D Deficiency, Fractures)     Status: Abnormal   Collection Time: 08/27/21  6:23 AM  Result Value Ref Range   Vit D, 25-Hydroxy 9.97 (L) 30 - 100 ng/mL    Comment: (NOTE) Vitamin D deficiency has been defined by the Institute of Medicine  and an Endocrine Society practice guideline as a level of serum 25-OH  vitamin D less than 20 ng/mL (1,2). The Endocrine Society went on to  further define vitamin D insufficiency as a level between 21 and 29  ng/mL (2).  1. IOM (Institute of Medicine). 2010. Dietary reference intakes for  calcium and D. [place]: [employer]. 2. Holick MF, Binkley Piney, Bischoff-Ferrari HA, et al. Evaluation,  treatment, and prevention of vitamin D deficiency: an Endocrine  Society clinical practice guideline, JCEM. 2011 Jul; 96(7): 1911-30.  Performed at [facility], [address], [place], [place] [address]   RPR     Status: None    Collection Time: 08/27/21  6:23 AM  Result Value Ref Range   RPR Ser Ql NON REACTIVE NON REACTIVE    Comment: Performed at [facility], [address] [place], [place] [address]  TSH     Status: Abnormal   Collection Time: 08/27/21  6:23 AM  Result Value Ref Range   TSH 0.199 (L) 0.350 - 4.500 uIU/mL    Comment: Performed by a 3rd Generation assay with a functional sensitivity of <=0.01 uIU/mL. Performed at [facility], [address], [place], [place] [address]   Lipid panel     Status: Abnormal   Collection Time: 08/27/21  6:23 AM  Result Value Ref Range   Cholesterol 168 0 - 200 mg/dL   Triglycerides 70 <130 mg/dL   HDL 40 (L) >86 mg/dL   Total CHOL/HDL Ratio 4.2 RATIO   VLDL 14 0 - 40 mg/dL   LDL Cholesterol 578 (H) 0 - 99 mg/dL    Comment:        Total Cholesterol/HDL:CHD Risk Coronary Heart Disease Risk Table                     Men   Women  1/2 Average Risk   3.4   3.3  Average Risk       5.0   4.4  2 X Average Risk   9.6   7.1  3 X Average Risk  23.4   11.0        Use the calculated Patient Ratio above and the CHD Risk  Table to determine the patient's CHD Risk.        ATP III CLASSIFICATION (LDL):  <100     mg/dL   Optimal  161-096  mg/dL   Near or Above                    Optimal  130-159  mg/dL   Borderline  045-409  mg/dL   High  >811     mg/dL   Very High Performed at [facility], [address], [place], [place] [address]   Hemoglobin A1c     Status: None   Collection Time: 08/27/21  6:23 AM  Result Value Ref Range   Hgb A1c MFr Bld 4.8 4.8 - 5.6 %    Comment: (NOTE) Pre diabetes:          5.7%-6.4%  Diabetes:              >6.4%  Glycemic control for   <7.0% adults with diabetes    Mean Plasma Glucose 91.06 mg/dL    Comment: Performed at [facility], [address] [place], [place] [address]  T4, free     Status: None   Collection Time: 08/27/21  6:39 PM  Result Value Ref Range   Free T4  1.09 0.61 - 1.12 ng/dL    Comment: (NOTE) Biotin ingestion may interfere with free T4 tests. If the results are inconsistent with the TSH level, previous test results, or the clinical presentation, then consider biotin interference. If needed, order repeat testing after stopping biotin. Performed at [facility], [address] [place], [place] [address]     Blood Alcohol level:  Lab Results  Component Value Date   ETH <10 08/25/2021    Metabolic Disorder Labs: Lab Results  Component Value Date   HGBA1C 4.8 08/27/2021   MPG 91.06 08/27/2021   MPG 114.02 06/26/2019   No results found for: PROLACTIN Lab Results  Component Value Date   CHOL 168 08/27/2021   TRIG 70 08/27/2021   HDL 40 (L) 08/27/2021   CHOLHDL 4.2 08/27/2021   VLDL 14 08/27/2021   LDLCALC 114 (H) 08/27/2021   LDLCALC 92 06/26/2019    Physical Findings: AIMS: Facial and Oral Movements Muscles of Facial Expression: None, normal Lips and Perioral Area: None, normal Jaw: None, normal Tongue: None, normal,Extremity Movements Upper (arms, wrists, hands, fingers): None, normal Lower (legs, knees, ankles, toes): None, normal, Trunk Movements Neck, shoulders, hips: None, normal, Overall Severity Severity of abnormal movements (highest score from questions above): None, normal Incapacitation due to abnormal movements: None, normal Patient's awareness of abnormal movements (rate only patient's report): No Awareness, Dental Status Current problems with teeth and/or dentures?: No Does patient usually wear dentures?: No  CIWA:    COWS:  COWS Total Score: 5  Musculoskeletal: Strength & Muscle Tone: Unable to assess as patient was lying in bed Gait & Station: Unable to assess as patient was lying in bed Patient leans: Unable to assess as patient was lying in bed  Psychiatric Specialty Exam:  Presentation  General Appearance: Disheveled  Eye Contact:Fleeting  Speech:Normal Rate  Speech  Volume:Normal  Handedness:Right   Mood and Affect  Mood: depressed Affect:Flat   Thought Process  Thought Processes:Coherent  Descriptions of Associations:Intact  Orientation:Full (Time, Place and Person)  Thought Content:Logical  History of Schizophrenia/Schizoaffective disorder: No Duration of Psychotic Symptoms: denies Hallucinations: denies  Ideas of Reference:None  Suicidal Thoughts:denies  Homicidal Thoughts:denies  Sensorium  Memory:Immediate Fair  Judgment:Fair  Insight:Fair   Executive Functions  Concentration:Fair  Attention Span:Poor  Recall:Fair  [employer] of Knowledge:Fair  Language:Fair   Psychomotor Activity  Psychomotor Activity:slow   Assets  Assets:Social Support; Housing; Desire for Improvement   Sleep  Sleep:No data recorded    Physical Exam: Physical Exam Vitals and nursing note reviewed.  Constitutional:      Appearance: Normal appearance. She is normal weight.  HENT:     Head: Normocephalic and atraumatic.  Pulmonary:     Effort: Pulmonary effort is normal.  Neurological:     General: No focal deficit present.     Mental Status: She is oriented to person, place, and time.   Review of Systems  Respiratory:  Negative for shortness of breath.   Cardiovascular:  Negative for chest pain.  Gastrointestinal:  Negative for abdominal pain, constipation, diarrhea, heartburn, nausea and vomiting.  Neurological:  Negative for headaches.  Blood pressure 106/74, pulse 100, temperature 98.2 F (36.8 C), temperature source Oral, resp. rate 18, height 5\' 3"  (1.6 m), weight 42.1 kg, SpO2 100 %. Body mass index is 16.44 kg/m.   Treatment Plan Summary: Daily contact with patient to assess and evaluate symptoms and progress in treatment and Medication management ASSESSMENT  Patient is a 55 year old female with history of substance use and depression presenting due to heroin overdose and passive SI.  PLAN Safety and  Monitoring: Voluntary admission to inpatient psychiatric unit for safety, stabilization and treatment Daily contact with patient to assess and evaluate symptoms and progress in treatment Patient's case to be discussed in multi-disciplinary team meeting Observation Level : q15 minute checks Vital signs: q12 hours Precautions: suicide, elopement, and assault   Psychiatric Problems Opiate use disorder-severe Major depressive disorder-recurrent episode, severe without psychotic features -DECREASE Suboxone 2-0.5 mg to once a daily for patient's opiate withdrawal -Continue clonidine taper for opiate withdrawal -Continue Remeron 15 mg qhs for sleep and depressive symptoms -NRT for nicotine use disorder -Ensure 3 times daily for nutritional support   Medical Problems Hypercholesteremia -Continue Lipitor 40 mg  History of stroke -Continue home med Plavix 75 mg daily   PRNs Tylenol 650 mg for mild pain Maalox/Mylanta 30 mL for indigestion Hydroxyzine 25 mg tid for anxiety Milk of Magnesia 30 mL for constipation Remeron 7.5 mg for sleep Bentyl 20 mg for abdominal cramping Imodium 2 to 4 mg as needed for diarrhea Robaxin 500 mg for muscle spasms Naproxen 500 mg for aching pain Zofran 4 mg for nausea   4. Discharge Planning: Social work and case management to assist with discharge planning and identification of hospital follow-up needs prior to discharge Estimated LOS: 5-7 days Discharge Concerns: Need to establish a safety plan; Medication compliance and effectiveness Discharge Goals: Return home with outpatient referrals for mental health follow-up including medication management/psychotherapy  58, MD 08/28/2021, 10:58 AM

## 2021-08-28 NOTE — Progress Notes (Signed)
D:  Patient denied SI and HI, contracts for safety.  Denied A/V hallucinations. A:  Medications administered per MD orders.  Emotional support and encouragement given patient. R:  Emotional support and encouragement given patient.

## 2021-08-28 NOTE — Plan of Care (Signed)
Nurse discussed coping skills with patient.  

## 2021-08-28 NOTE — Progress Notes (Signed)
[facility] Group Notes:  (Nursing/MHT/Case Management/Adjunct)  Date:  08/28/2021  Time:  2015 Type of Therapy:   wrap up gruop  Participation Level:  Active  Participation Quality:  Appropriate, Attentive, Sharing, and Supportive  Affect:  Depressed  Cognitive:  Alert  Insight:  Improving  Engagement in Group:  Engaged  Modes of Intervention:  Clarification, Education, and Support  Summary of Progress/Problems: Positive thinking and positive change were discussed.   Marcille Buffy 08/28/2021, 8:52 PM

## 2021-08-28 NOTE — [facility] Counselor (Signed)
CSW attempted to meet with pt to complete PSA but pt asked CSW to come back tomorrow.  Fredirick Lathe, LCSWA Clinicial Social Worker [employer]

## 2021-08-29 DIAGNOSIS — F1123 Opioid dependence with withdrawal: Secondary | ICD-10-CM

## 2021-08-29 DIAGNOSIS — F332 Major depressive disorder, recurrent severe without psychotic features: Principal | ICD-10-CM

## 2021-08-29 LAB — T3, FREE: T3, Free: 1.4 pg/mL — ABNORMAL LOW (ref 2.0–4.4)

## 2021-08-29 LAB — VITAMIN B1: Vitamin B1 (Thiamine): 116.2 nmol/L (ref 66.5–200.0)

## 2021-08-29 MED ORDER — TRAZODONE HCL 50 MG PO TABS
50.0000 mg | ORAL_TABLET | Freq: Every day | ORAL | Status: DC
  Administered 2021-08-29: 22:00:00 50 mg via ORAL
  Filled 2021-08-29 (×2): qty 1

## 2021-08-29 MED ORDER — MELATONIN 5 MG PO TABS
5.0000 mg | ORAL_TABLET | Freq: Every day | ORAL | Status: DC
  Administered 2021-08-29: 22:00:00 5 mg via ORAL
  Filled 2021-08-29 (×2): qty 1

## 2021-08-29 MED ORDER — TRAZODONE HCL 50 MG PO TABS
50.0000 mg | ORAL_TABLET | Freq: Once | ORAL | Status: AC
  Administered 2021-08-29: 01:00:00 50 mg via ORAL
  Filled 2021-08-29 (×2): qty 1

## 2021-08-29 NOTE — Group Note (Signed)
LCSW Group Therapy Note   Group Date: 08/29/2021 Start Time: 1300 End Time: 1400  Type of Therapy and Topic:  Group Therapy:  Self-Esteem   Participation Level:  Active  Description of Group: This group addressed positive self-esteem. Patients were given a worksheet with a blank shield. Patients were asked what a shield is and when it is used. Patients were asked to list, draw, or write protective factors in the their lives on their shields. Patients discussed the words, ideas and drawings that they put on their shield. Patients were encouraged to have a daily reflection of positive characteristics/ protective factors.  Therapeutic Goals Patient will verbalize two of their positive qualities Patient will demonstrate insight but naming social supports in their lives Patient will verbalize their feelings when voicing positive self affirmations and when voicing positive affirmations of others Patients will discuss the potential positive impact on their wellness/recovery of focusing on positive traits of self and others.  Summary of Patient Progress: Pt shared her name during introductions and that that she is looking forward to getting back to her support system when she is discharged. Pt accepted worksheet and participated appropriately in discussion.  Felizardo Hoffmann, LCSWA 08/29/2021  1:15 PM

## 2021-08-29 NOTE — [facility] Group Notes (Signed)
Group Note- Orientation and goals group with PsychoEducation.  Pt were asked to identify goal they would like to work on, and unit ward rules were reviewed as well as a schedule of the day.  ''I walk down the street by [employer]'' poem was read to identify negative behavioral patterns. Pt identified her addtiction as her hole, and stated her goal was to just keep doing what she's doing to get better.

## 2021-08-29 NOTE — Group Note (Signed)
Recreation Therapy Group Note   Group Topic:Stress Management  Group Date: 08/29/2021 Start Time: 0940 End Time: 0950 Facilitators: Caroll Rancher, LRT,CTRS Location: [address] Dayroom   Goal Area(s) Addresses:  Patient will actively participate in stress management techniques presented during session.  Patient will successfully identify benefit of practicing stress management post d/c.   Group Description: Meditation. LRT played a meditation that focused on new horizons and being open to new things/experiences in the coming year.  Patients were to listen and follow along as meditation played to fully engage in activity.  Patients were given suggestions on using the Internet and Apps to locate meditations.   Affect/Mood: N/A   Participation Level: Did not attend    Clinical Observations/Individualized Feedback:     Plan: Continue to engage patient in RT group sessions 2-3x/week.   Caroll Rancher, LRT,CTRS 08/29/2021 11:33 AM

## 2021-08-29 NOTE — Progress Notes (Signed)
Pt presents with depressed mood, affect blunted. Katelyn Smith states she continues to feel horrible, depressed and that she has persistent withdrawal symptoms. Patient denies any SI/ HI/AV Hallucinations. Patient states her goal for today is to get better. Patient has been visible on the unit, she did attend group today. She has been compliant with medications. Will continue to monitor. Pt is safe.

## 2021-08-29 NOTE — Progress Notes (Signed)
Adult Psychoeducational Group Note  Date:  08/29/2021 Time:  2:07 PM  Group Topic/Focus:  Dimensions of Wellness:   The focus of this group is to introduce the topic of wellness and discuss the role each dimension of wellness plays in total health.  Participation Level:  Active  Participation Quality:  Appropriate  Affect:  Appropriate  Cognitive:  Appropriate  Insight: Appropriate  Engagement in Group:  Engaged  Modes of Intervention:  Discussion  Additional Comments: Patient attended group and participated.   Yaritzel Stange W Render Marley 08/29/2021, 2:07 PM

## 2021-08-29 NOTE — Progress Notes (Signed)
°  MHT informs writer that pt reports she is unable to sleep.  Provider notified.  Administered 50 mg Trazodone per MAR per provider.

## 2021-08-29 NOTE — [facility] Group Notes (Signed)
PsychoEducational group-  Pt were asked to read a poem by Lemmie Evens identifying the brain as both a chimp who over reacts and the owl, who thinks things through using judgement. Patients were then asked to apply this thinking to their own lives and what circumstances contributed to their being here.

## 2021-08-29 NOTE — Progress Notes (Signed)
[facility] MD Progress Note  08/29/2021 2:41 PM Katelyn Smith  MRN:  [id number] Reason for admission: Patient is a 55 year old female with history of substance use and depression presenting due to heroin overdose and passive SI. Principal Problem: MDD (major depressive disorder), recurrent episode, severe (HCC) Diagnosis: Principal Problem:   MDD (major depressive disorder), recurrent episode, severe (HCC) Active Problems:   Opioid dependence with withdrawal (HCC)   BMI less than 19,adult   Vitamin D deficiency  Total Time spent with patient: 30 minutes  Past Psychiatric History: see H&P  Subjective Patient discussed this morning during progression rounds with treatment team. She spend most of the day yesterday in bed. She comes out for medications and to use the phone. She has not been cooperative with the SW evaluation. She decided that she wants to go home yesterday evening.  Patient was seen and assessed today on rounds.   Patient reports that she didn't sleep very well overnight. She believes that it was due to keeping the nicotine patch on. We talk about trying to increase daytime activity by attending more groups. She only went to 2 total. She has been able to keep down some crackers and things like that, overall not really eating much and appetite isn't that great, but not really having the nausea anymore. No further issues with diarrhea and constipation. She denies cravings, but appears to be minimizing to obtain discharge. She gives an overall flowery view of her relationship with her spouse and daughter. Whereas yesterday just the mention of it was a tearful thing. She states that she wants to go home because her family misses her and wants her to come home. I advise her to reconsider leaving, and think about it overnight. If she continues to want to leave, we can discharge her tomorrow. She agrees to reconsider. Otherwise she denies harm to self and others, no hallucinations.   Past  Medical History:  Past Medical History:  Diagnosis Date   Anxiety    Chest pain    a. 07/2016: Stress test showing no evidence of ischemia. b. cath 08/2016: 40% Ost RCA stenosis, 70% Lateral 3rd Mrg, and 80% Acute Mrg (too small for intervention, therefore medical therapy recommended).    Chronic back pain    Chronic neck pain    Depression    H/O degenerative disc disease    High cholesterol    Lumbar radiculopathy, right    Stroke (HCC) 10/2019   Tobacco abuse     Past Surgical History:  Procedure Laterality Date   BACK SURGERY     CARDIAC CATHETERIZATION N/A 09/09/2016   Procedure: Left Heart Cath and Coronary Angiography;  Surgeon: Peter M Swaziland, MD;  Location: [facility] INVASIVE CV LAB;  Service: Cardiovascular;  Laterality: N/A;   CARDIAC CATHETERIZATION  09/09/2016   TUBAL LIGATION     Family History:  Family History  Problem Relation Age of Onset   Heart failure Mother    Heart disease Mother 69       first MI early 59s   Cancer Father        Died at 10   Cancer Sister    Stroke Other    Heart disease Other    Stroke Maternal Grandmother    Family Psychiatric  History: see H&P Social History:  Social History   Substance and Sexual Activity  Alcohol Use No     Social History   Substance and Sexual Activity  Drug Use No    Social  History   Socioeconomic History   Marital status: Married    Spouse name: Sunya Humbarger   Number of children: 3   Years of education: Not on file   Highest education level: 12th grade  Occupational History   Occupation: Cook  Tobacco Use   Smoking status: Every Day    Packs/day: 1.00    Years: 30.00    Pack years: 30.00    Types: Cigarettes   Smokeless tobacco: Never   Tobacco comments:    Since age 11  Vaping Use   Vaping Use: Some days  Substance and Sexual Activity   Alcohol use: No   Drug use: No   Sexual activity: Yes    Birth control/protection: Surgical  Other Topics Concern   Not on file  Social History  Narrative   Not on file   Social Determinants of Health   Financial Resource Strain: Not on file  Food Insecurity: Not on file  Transportation Needs: Not on file  Physical Activity: Not on file  Stress: Not on file  Social Connections: Not on file   Additional Social History:        Sleep: Fair  Appetite:  Fair  Current Medications: Current Facility-Administered Medications  Medication Dose Route Frequency Provider Last Rate Last Admin   acetaminophen (TYLENOL) tablet 650 mg  650 mg Oral Q4H PRN Nira Conn A, NP   650 mg at 08/28/21 0405   atorvastatin (LIPITOR) tablet 40 mg  40 mg Oral q1800 Nira Conn A, NP   40 mg at 08/28/21 1725   cloNIDine (CATAPRES) tablet 0.1 mg  0.1 mg Oral BID Nira Conn A, NP   0.1 mg at 08/29/21 0818   Followed by   Melene Muller ON 08/30/2021] cloNIDine (CATAPRES) tablet 0.1 mg  0.1 mg Oral Daily Nira Conn A, NP       clopidogrel (PLAVIX) tablet 75 mg  75 mg Oral Daily Nira Conn A, NP   75 mg at 08/29/21 0817   dicyclomine (BENTYL) capsule 20 mg  20 mg Oral Q6H PRN Nira Conn A, NP   20 mg at 08/27/21 2119   feeding supplement (ENSURE ENLIVE / ENSURE PLUS) liquid 237 mL  237 mL Oral TID BM Mason Jim, Amy E, MD   237 mL at 08/28/21 1500   hydrOXYzine (ATARAX) tablet 25 mg  25 mg Oral Q6H PRN Nira Conn A, NP   25 mg at 08/28/21 2112   loperamide (IMODIUM) capsule 2-4 mg  2-4 mg Oral PRN Jackelyn Poling, NP       melatonin tablet 5 mg  5 mg Oral QHS Ryan Palermo, Shelbie Hutching, MD       methocarbamol (ROBAXIN) tablet 500 mg  500 mg Oral Q8H PRN Nira Conn A, NP   500 mg at 08/29/21 1438   mirtazapine (REMERON) tablet 15 mg  15 mg Oral Seabron Spates, MD   15 mg at 08/28/21 2112   naproxen (NAPROSYN) tablet 500 mg  500 mg Oral BID PRN Nira Conn A, NP   500 mg at 08/29/21 1437   nicotine (NICODERM CQ - dosed in mg/24 hours) patch 14 mg  14 mg Transdermal Daily Lamar Sprinkles, MD   14 mg at 08/29/21 0815   ondansetron (ZOFRAN-ODT) disintegrating  tablet 4 mg  4 mg Oral Q6H PRN Nira Conn A, NP   4 mg at 08/28/21 0815   traZODone (DESYREL) tablet 50 mg  50 mg Oral QHS Sopheap Basic, Shelbie Hutching, MD  Vitamin D (Ergocalciferol) (DRISDOL) capsule 50,000 Units  50,000 Units Oral Q7 days Roselle Locus, MD   50,000 Units at 08/28/21 1100    Lab Results:  Results for orders placed or performed during the hospital encounter of 08/26/21 (from the past 48 hour(s))  T3, free     Status: Abnormal   Collection Time: 08/27/21  6:39 PM  Result Value Ref Range   T3, Free 1.4 (L) 2.0 - 4.4 pg/mL    Comment: (NOTE) Performed At: [facility] [address] [place], [place] [id number] Jolene Schimke MD IP:[id number]   T4, free     Status: None   Collection Time: 08/27/21  6:39 PM  Result Value Ref Range   Free T4 1.09 0.61 - 1.12 ng/dL    Comment: (NOTE) Biotin ingestion may interfere with free T4 tests. If the results are inconsistent with the TSH level, previous test results, or the clinical presentation, then consider biotin interference. If needed, order repeat testing after stopping biotin. Performed at [facility], [address] [place], [place] [address]     Blood Alcohol level:  Lab Results  Component Value Date   ETH <10 08/25/2021    Metabolic Disorder Labs: Lab Results  Component Value Date   HGBA1C 4.8 08/27/2021   MPG 91.06 08/27/2021   MPG 114.02 06/26/2019   No results found for: PROLACTIN Lab Results  Component Value Date   CHOL 168 08/27/2021   TRIG 70 08/27/2021   HDL 40 (L) 08/27/2021   CHOLHDL 4.2 08/27/2021   VLDL 14 08/27/2021   LDLCALC 114 (H) 08/27/2021   LDLCALC 92 06/26/2019    Physical Findings: AIMS: Facial and Oral Movements Muscles of Facial Expression: None, normal Lips and Perioral Area: None, normal Jaw: None, normal Tongue: None, normal,Extremity Movements Upper (arms, wrists, hands, fingers): None, normal Lower (legs, knees, ankles, toes): None, normal,  Trunk Movements Neck, shoulders, hips: None, normal, Overall Severity Severity of abnormal movements (highest score from questions above): None, normal Incapacitation due to abnormal movements: None, normal Patient's awareness of abnormal movements (rate only patient's report): No Awareness, Dental Status Current problems with teeth and/or dentures?: No Does patient usually wear dentures?: No  CIWA:    COWS:  COWS Total Score: 7  Musculoskeletal: Strength & Muscle Tone: Unable to assess as patient was lying in bed Gait & Station: Unable to assess as patient was lying in bed Patient leans: Unable to assess as patient was lying in bed  Psychiatric Specialty Exam:  Presentation  General Appearance: Appropriate for Environment  Eye Contact:Fair  Speech:Clear and Coherent  Speech Volume:Normal  Handedness:Right   Mood and Affect  Mood: depressed Affect:Depressed   Thought Process  Thought Processes:Linear  Descriptions of Associations:Intact  Orientation:Full (Time, Place and Person)  Thought Content:Logical  History of Schizophrenia/Schizoaffective disorder: No Duration of Psychotic Symptoms: denies Hallucinations: denies  Ideas of Reference:None  Suicidal Thoughts:denies  Homicidal Thoughts:denies   Sensorium  Memory:Immediate Fair  Judgment:Fair  Insight:Shallow   Executive Functions  Concentration:Fair  Attention Span:Fair  Recall:Fair  Fund of Knowledge:Fair  Language:Fair   Psychomotor Activity  Psychomotor Activity:slow   Assets  Assets:Financial Resources/Insurance; Transportation; Communication Skills   Sleep  Sleep:Sleep: Poor     Physical Exam: Physical Exam Vitals and nursing note reviewed.  Constitutional:      Appearance: Normal appearance. She is normal weight.  HENT:     Head: Normocephalic and atraumatic.  Eyes:     Extraocular Movements: Extraocular movements intact.  Pulmonary:     Effort: Pulmonary effort  is normal.  Neurological:     General: No focal deficit present.     Mental Status: She is oriented to person, place, and time.   Review of Systems  Constitutional:  Positive for malaise/fatigue.  Gastrointestinal:  Negative for constipation, diarrhea, nausea and vomiting.  Neurological:  Negative for dizziness and headaches.  Psychiatric/Behavioral:  Positive for depression and substance abuse. Negative for hallucinations and suicidal ideas. The patient has insomnia.   Blood pressure (!) 122/91, pulse (!) 102, temperature 97.7 F (36.5 C), temperature source Oral, resp. rate 18, height  (1.6 m), weight 42.1 kg, SpO2 98 %. Body mass index is 16.44 kg/m.   Treatment Plan Summary: Daily contact with patient to assess and evaluate symptoms and progress in treatment and Medication management ASSESSMENT  Patient is a 55 year old female with history of substance use and depression presenting due to heroin overdose and passive SI.  PLAN Safety and Monitoring: Voluntary admission to inpatient psychiatric unit for safety, stabilization and treatment Daily contact with patient to assess and evaluate symptoms and progress in treatment Patient's case to be discussed in multi-disciplinary team meeting Observation Level : q15 minute checks Vital signs: q12 hours Precautions: suicide, elopement, and assault   Psychiatric Problems Opiate use disorder-severe Major depressive disorder-recurrent episode, severe without psychotic features -discontinued Suboxone - taper concluded -Continue clonidine taper for opiate withdrawal -Continue Remeron 15 mg qhs for sleep and depressive symptoms -NRT for nicotine use disorder -Ensure 3 times daily for nutritional support - Started melatonin  PO QHS for sleep. Advised patient to remove nicotine patch 2 hours prior to going to sleep. Counseled on sleep hygiene   - added trazodone  PO QHS PRN -Remeron  PO QHS for mood, anxiety and  sleep   Medical Problems Hypercholesteremia -Continue Lipitor 40 mg  Vitamin D deficiency -Ergocalciferol 50 000 units PO weekly  History of stroke -Continue home med Plavix 75 mg daily   PRNs Tylenol 650 mg for mild pain Maalox/Mylanta 30 mL for indigestion Hydroxyzine 25 mg tid for anxiety Milk of Magnesia 30 mL for constipation Bentyl 20 mg for abdominal cramping Imodium 2 to 4 mg as needed for diarrhea Robaxin 500 mg for muscle spasms Naproxen 500 mg for aching pain Zofran 4 mg for nausea   4. Discharge Planning: Social work and case management to assist with discharge planning and identification of hospital follow-up needs prior to discharge Estimated LOS: 5-7 days Discharge Concerns: Need to establish a safety plan; Medication compliance and effectiveness Discharge Goals: Return home with outpatient referrals for mental health follow-up including medication management/psychotherapy - likely Saturday  Roselle Locus, MD 08/29/2021, 2:41 PM

## 2021-08-29 NOTE — BHH Counselor (Signed)
CSW attempted to contact the Pt's daughter to complete an SPE.  The phone does not ring but does give a busy/beeping tone.  There is no voicemail available at this time.  CSW will attempt again tomorrow 08/30/2021.

## 2021-08-30 MED ORDER — VITAMIN D (ERGOCALCIFEROL) 1.25 MG (50000 UNIT) PO CAPS: 50000.0000 [IU] | ORAL_CAPSULE | ORAL | 0 refills | Status: AC

## 2021-08-30 MED ORDER — NICOTINE 14 MG/24HR TD PT24: 14.0000 mg | MEDICATED_PATCH | Freq: Every day | TRANSDERMAL | 0 refills | Status: AC

## 2021-08-30 MED ORDER — TRAZODONE HCL 50 MG PO TABS: 50.0000 mg | ORAL_TABLET | Freq: Every day | ORAL | 0 refills | Status: AC

## 2021-08-30 MED ORDER — MELATONIN 5 MG PO TABS: 5.0000 mg | ORAL_TABLET | Freq: Every day | ORAL | 0 refills | Status: AC

## 2021-08-30 MED ORDER — METHOCARBAMOL 500 MG PO TABS: 500.0000 mg | ORAL_TABLET | Freq: Three times a day (TID) | ORAL | 0 refills | Status: AC | PRN

## 2021-08-30 MED ORDER — NAPROXEN 500 MG PO TABS: 500.0000 mg | ORAL_TABLET | Freq: Two times a day (BID) | ORAL | 0 refills | Status: AC | PRN

## 2021-08-30 MED ORDER — MIRTAZAPINE 15 MG PO TABS: 15.0000 mg | ORAL_TABLET | Freq: Every day | ORAL | 0 refills | Status: AC

## 2021-08-30 NOTE — BHH Suicide Risk Assessment (Signed)
Suicide Risk Assessment  Discharge Assessment    [facility] Discharge Suicide Risk Assessment   Principal Problem: MDD (major depressive disorder), recurrent episode, severe (HCC) Discharge Diagnoses: Principal Problem:   MDD (major depressive disorder), recurrent episode, severe (HCC) Active Problems:   Opioid dependence with withdrawal (HCC)   BMI less than 19,adult   Vitamin D deficiency   Patient is a 55 year old female with history of substance use and depression presenting due to heroin overdose and passive SI. Patient denies SI, HI, and AVH. Patient was educated on discharge regarding safety planning should she decompensate or relapse in her sobriety. Patient indicated understanding. Patient endorsed that she felt she had learned how to communicate better and was interested in NA at discharge.   Total Time spent with patient: 15 minutes  Musculoskeletal: Strength & Muscle Tone: within normal limits Gait & Station: normal Patient leans: N/A  Psychiatric Specialty Exam  Presentation  General Appearance: Appropriate for Environment  Eye Contact:Fair  Speech:Clear and Coherent  Speech Volume:Normal  Handedness:Right   Mood and Affect  Mood:Depressed  Duration of Depression Symptoms: Less than two weeks  Affect:Depressed   Thought Process  Thought Processes:Linear  Descriptions of Associations:Intact  Orientation:Full (Time, Place and Person)  Thought Content:Logical  History of Schizophrenia/Schizoaffective disorder:No  Duration of Psychotic Symptoms:No data recorded Hallucinations:Hallucinations: None  Ideas of Reference:None  Suicidal Thoughts:Suicidal Thoughts: No  Homicidal Thoughts:Homicidal Thoughts: No   Sensorium  Memory:Immediate Fair  Judgment:Fair  Insight:Shallow   Executive Functions  Concentration:Fair  Attention Span:Fair  Recall:Fair  Fund of Knowledge:Fair  Language:Fair   Psychomotor Activity  Psychomotor  Activity:Psychomotor Activity: Decreased   Assets  Assets:Financial Resources/Insurance; Transportation; Communication Skills   Sleep  Sleep:Sleep: Poor   Physical Exam: Physical Exam ROS Blood pressure (!) 133/93, pulse 89, temperature 98 F (36.7 C), temperature source Oral, resp. rate 18, height 5\' 3"  (1.6 m), weight 42.1 kg, SpO2 100 %. Body mass index is 16.44 kg/m.  Mental Status Per Nursing Assessment::   On Admission:  Suicidal ideation indicated by others  Demographic Factors:  Caucasian  Loss Factors: NA  Historical Factors: Substance use disorder  Risk Reduction Factors:   Employed and Living with another person, especially a relative  Continued Clinical Symptoms:  Underweight- BMI 16.44  Cognitive Features That Contribute To Risk:  None    Suicide Risk:  Minimal: No identifiable suicidal ideation.  Patients presenting with no risk factors but with morbid ruminations; may be classified as minimal risk based on the severity of the depressive symptoms   Follow-up Information     [facility] Follow up.   Why: Please go to this provider or call to schedule an appointment for therapy and medication management services. Contact information: [address] [place] [phone]                 Plan Of Care/Follow-up recommendations:  Follow up recommendations: - Activity as tolerated. - Diet as recommended by PCP. - Keep all scheduled follow-up appointments as recommended.    [id number] [phone], MD 08/30/2021, 9:35 AM

## 2021-08-30 NOTE — Progress Notes (Signed)
°  [facility] Adult Case Management Discharge Plan :  Will you be returning to the same living situation after discharge:  Yes,  at home with husband. At discharge, do you have transportation home?: Yes,  husband to pick up at 10:30am. Do you have the ability to pay for your medications: Yes,  referred to center [facility] help with [facility] ([facility]).  Release of information consent forms completed and in the chart;  Patient's signature needed at discharge.  Patient to Follow up at:  Follow-up Information     [facility] Follow up.   Why: Please go to this provider or call to schedule an appointment for therapy and medication management services. Contact information: [address] [place] [address] [phone]                 Next level of care provider has access to [facility] Link:no  Safety Planning and Suicide Prevention discussed: Yes,  patient provided suicide prevention pamphlet at discharge.      Has patient been referred to the Quitline?: Patient refused referral  Patient has been referred for addiction treatment: Yes  Aldine Contes, LCSWA 08/30/2021, 9:47 AM

## 2021-08-30 NOTE — Progress Notes (Signed)
Pt discharged to lobby, where husband was present to transport home. Pt was stable and appreciative at that time. All papers and electronic prescriptions were given and valuables returned. Verbal understanding expressed. Denies SI/HI and A/VH. Pt given opportunity to express concerns and ask questions.

## 2021-08-30 NOTE — BHH Counselor (Signed)
Adult Comprehensive Assessment  Patient ID: Katelyn Smith, female   DOB: [birth year], 55 y.o.   MRN: [id number]  Information Source: Information source: Patient  Current Stressors:  Patient states their primary concerns and needs for treatment are:: "heroin use." Patient states their goals for this hospitilization and ongoing recovery are:: "to get off." Educational / Learning stressors: Denies Employment / Job issues: Denies Family Relationships: Denies Surveyor, quantity / Lack of resources (include bankruptcy): Denies Housing / Lack of housing: Denies Physical health (include injuries & life threatening diseases): Denies Social relationships: Denies Substance abuse: Denies Bereavement / Loss: Denies  Living/Environment/Situation:  Living Arrangements: Spouse/significant other Who else lives in the home?: Husband, Daughter, 3 children How long has patient lived in current situation?: 4 years What is atmosphere in current home: Comfortable  Family History:  Marital status: Married Number of Years Married: 34 What types of issues is patient dealing with in the relationship?: Denies Are you sexually active?: Yes What is your sexual orientation?: heterosexual Does patient have children?: Yes How many children?: 3 How is patient's relationship with their children?: "good"  Childhood History:  By whom was/is the patient raised?: Grandparents Additional childhood history information: Pt reports growing up in [place] Description of patient's relationship with caregiver when they were a child: "good" Patient's description of current relationship with people who raised him/her: Pt's grandparents are deceased How were you disciplined when you got in trouble as a child/adolescent?: UTA Does patient have siblings?: Yes Number of Siblings: 3 Description of patient's current relationship with siblings: "good" Did patient suffer any verbal/emotional/physical/sexual abuse as a child?: No Did  patient suffer from severe childhood neglect?: No Has patient ever been sexually abused/assaulted/raped as an adolescent or adult?: No Was the patient ever a victim of a crime or a disaster?: No Witnessed domestic violence?: No Has patient been affected by domestic violence as an adult?: No  Education:  Highest grade of school patient has completed: 12th grade Currently a student?: No Learning disability?: No  Employment/Work Situation:   Employment Situation: Employed Where is Patient Currently Employed?: [employer] Long has Patient Been Employed?: 15 years Are You Satisfied With Your Job?: Yes Do You Work More Than One Job?: No Patient's Job has Been Impacted by Current Illness: No What is the Longest Time Patient has Held a Job?: 15 years Where was the Patient Employed at that Time?: [employer] Has Patient ever Been in the [employer]?: No  Financial Resources:   Financial resources: Income from employment, Private insurance Does patient have a representative payee or guardian?: No  Alcohol/Substance Abuse:   What has been your use of drugs/alcohol within the last 12 months?: Heroin daily x2 "a line" via snorting If attempted suicide, did drugs/alcohol play a role in this?: No Alcohol/Substance Abuse Treatment Hx: Denies past history Has alcohol/substance abuse ever caused legal problems?: No  Social Support System:   Patient's Community Support System: Good Describe Community Support System: Husband Type of faith/religion: Christian How does patient's faith help to cope with current illness?: Denies  Leisure/Recreation:   Do You Have Hobbies?: Yes Leisure and Hobbies: "being with my grandkids"  Strengths/Needs:   What is the patient's perception of their strengths?: "get things done."  Discharge Plan:   Currently receiving community mental health services: No Patient states concerns and preferences for aftercare planning are: Interested in medication management and  therapy. Pt declined outpatient and residential substance use resources. Patient states they will know when they are safe and  ready for discharge when: "I am ready now. I think I will do better at home." Does patient have access to transportation?: Yes (via husband) Does patient have financial barriers related to discharge medications?: No Will patient be returning to same living situation after discharge?: Yes (personal home)  Summary/Recommendations:   Summary and Recommendations (to be completed by the evaluator): 55 y/o female with no MH hx. Pt presents due to withdrawals. Reports no stressors in her life at this time. During assessment, patient is primarily concerned discharging. CSW encouraged patient and discussed process of making referrals for outpatient therapy. Patient denied outpatient and inpatient substance use resources. Patient presented as flat but cooperative, no evidence of memory or concentration impairment, no evidence of AVH, appearance is WNL. Therapeutic recommendations include crisis stabilization, medication management, group therapy, and case management.  Please note that the above assessment was completed by weekday social worker.  Lynnell Chad. 08/30/2021

## 2021-08-30 NOTE — Progress Notes (Signed)
°   08/30/21 1000  Psych Admission Type (Psych Patients Only)  Admission Status Involuntary  Psychosocial Assessment  Patient Complaints Other (Comment) (mild withdrawal symptoms)  Eye Contact Brief  Facial Expression Sad  Affect Sad  Speech Logical/coherent  Interaction Forwards little;Guarded  Motor Activity Other (Comment) (steady gait)  Appearance/Hygiene Unremarkable  Behavior Characteristics Cooperative;Appropriate to situation  Mood Anxious  Aggressive Behavior  Effect No apparent injury  Thought Process  Coherency WDL  Content WDL  Delusions None reported or observed  Perception WDL  Hallucination None reported or observed  Judgment Impaired  Confusion None  Danger to Self  Current suicidal ideation? Denies  Danger to Others  Danger to Others None reported or observed

## 2021-08-30 NOTE — [facility] Suicide Risk Assessment (Signed)
[facility] INPATIENT:  Family/Significant Other Suicide Prevention Education  Suicide Prevention Education:  Contact Attempts: Katelyn Smith (daughter), (name of family member/significant other) has been identified by the patient as the family member/significant other with whom the patient will be residing, and identified as the person(s) who will aid the patient in the event of a mental health crisis.  With written consent from the patient, two attempts were made to provide suicide prevention education, prior to and/or following the patient's discharge.  We were unsuccessful in providing suicide prevention education.  A suicide education pamphlet was given to the patient to share with family/significant other.  Date and time of first attempt:12/30 by weekday social worker at 2:58pm at [phone] Date and time of second attempt:12/31 via [facility] [employer] [place] at 9:30am with two different numbers, [phone] and [phone]. HIPPA compliant voicemail left with callback number, suicide prevention education provided to patient upon discharge.  [name] 08/30/2021, 9:32 AM

## 2021-08-30 NOTE — Progress Notes (Addendum)
D- Patient alert and oriented x4. Patient in depressed mood but states she feels better today. Denies SI, HI, AVH. Pt has minimal interaction with pees and staff. Pt stays in her room majority of the shift or talking on the phone.    A- Scheduled medications administered to patient, per MD orders.Routine safety checks conducted every 15 minutes.  Patient informed to notify staff with problems or concerns.   R- Patient compliant with medications and verbalized understanding treatment plan. Patient receptive, calm, and cooperative.      08/29/21 2130  Psych Admission Type (Psych Patients Only)  Admission Status Involuntary  Psychosocial Assessment  Patient Complaints Depression;Irritability  Eye Contact Brief  Facial Expression Sad  Affect Anxious;Irritable;Labile  Speech Logical/coherent  Interaction Forwards little;Guarded  Motor Activity Tremors  Appearance/Hygiene Unremarkable  Behavior Characteristics Anxious;Fidgety;Irritable  Mood Depressed;Irritable;Labile  Thought Process  Coherency WDL  Content WDL  Delusions None reported or observed  Perception WDL  Hallucination None reported or observed  Judgment Impaired  Confusion None  Danger to Self  Current suicidal ideation? Denies  Danger to Others  Danger to Others None reported or observed

## 2021-08-30 NOTE — Discharge Summary (Signed)
Physician Discharge Summary Note  Patient:  Katelyn Smith is an 55 y.o., female MRN:  [id number] DOB:  Jun 27, 1966 Patient phone:  [phone] (home)  Patient address:   [address]  [place] [address],  Total Time spent with patient: 15 minutes  Date of Admission:  08/26/2021 Date of Discharge: 08/30/2021  Reason for Admission:  passive SI and Heroin use  Principal Problem: MDD (major depressive disorder), recurrent episode, severe (HCC) Discharge Diagnoses: Principal Problem:   MDD (major depressive disorder), recurrent episode, severe (HCC) Active Problems:   Opioid dependence with withdrawal (HCC)   BMI less than 19,adult   Vitamin D deficiency   Past Psychiatric History: substance abuse, depressed mood  Past Medical History:  Past Medical History:  Diagnosis Date   Anxiety    Chest pain    a. 07/2016: Stress test showing no evidence of ischemia. b. cath 08/2016: 40% Ost RCA stenosis, 70% Lateral 3rd Mrg, and 80% Acute Mrg (too small for intervention, therefore medical therapy recommended).    Chronic back pain    Chronic neck pain    Depression    H/O degenerative disc disease    High cholesterol    Lumbar radiculopathy, right    Stroke (HCC) 10/2019   Tobacco abuse     Past Surgical History:  Procedure Laterality Date   BACK SURGERY     CARDIAC CATHETERIZATION N/A 09/09/2016   Procedure: Left Heart Cath and Coronary Angiography;  Surgeon: Peter M Swazil[place], MD;  Location: [facility] INVASIVE CV LAB;  Service: Cardiovascular;  Laterality: N/A;   CARDIAC CATHETERIZATION  09/09/2016   TUBAL LIGATION     Family History:  Family History  Problem Relation Age of Onset   Heart failure Mother    Heart disease Mother 56       first MI early 77s   Cancer Father        Died at 64   Cancer Sister    Stroke Other    Heart disease Other    Stroke Maternal Grandmother    Family Psychiatric  History: Denies Social History:  Social History   Substance and  Sexual Activity  Alcohol Use No     Social History   Substance and Sexual Activity  Drug Use No    Social History   Socioeconomic History   Marital status: Married    Spouse name: Saragrace Selke   Number of children: 3   Years of education: Not on file   Highest education level: 12th grade  Occupational History   Occupation: Cook  Tobacco Use   Smoking status: Every Day    Packs/day: 1.00    Years: 30.00    Pack years: 30.00    Types: Cigarettes   Smokeless tobacco: Never   Tobacco comments:    Since age 17  Vaping Use   Vaping Use: Some days  Substance and Sexual Activity   Alcohol use: No   Drug use: No   Sexual activity: Yes    Birth control/protection: Surgical  Other Topics Concern   Not on file  Social History Narrative   Not on file   Social Determinants of Health   Financial Resource Strain: Not on file  Food Insecurity: Not on file  Transportation Needs: Not on file  Physical Activity: Not on file  Stress: Not on file  Social Connections: Not on file    Hospital Course:  [name] is a 55 YO F with a history of substance use and  depression who started using oral opioids following a surgery at age 78 and transitioned to heroin a year ago. Patient was started on Clonidine PRN with addn.PRNs for Heroin detox. Patient was also started and completed a Suboxone taper. Patient was also starter on Remeron for mood and sleep as well as melatonin. Patient did not have a complicated withdrawal and although was noted to be underweight did have some improvement in appetite. Patient attended groups and interacted well on the mielu. Patient Remeron was titrated up to 15mg  and patient endorsed improved mood. Patient was able to endorse a plan for sobriety at discharge. Patient endorsed that she was going to take more work hours and her children and husband were going to help her stay accountable. At discharge patient denied SI, HI, and AVH.   Physical Findings: AIMS: Facial  and Oral Movements Muscles of Facial Expression: None, normal Lips and Perioral Area: None, normal Jaw: None, normal Tongue: None, normal,Extremity Movements Upper (arms, wrists, hands, fingers): None, normal Lower (legs, knees, ankles, toes): None, normal, Trunk Movements Neck, shoulders, hips: None, normal, Overall Severity Severity of abnormal movements (highest score from questions above): None, normal Incapacitation due to abnormal movements: None, normal Patient's awareness of abnormal movements (rate only patient's report): No Awareness, Dental Status Current problems with teeth and/or dentures?: No Does patient usually wear dentures?: No  CIWA:    COWS:  COWS Total Score: 3  Musculoskeletal: Strength & Muscle Tone: within normal limits Gait & Station: normal Patient leans: N/A   Psychiatric Specialty Exam:  Presentation  General Appearance: Appropriate for Environment  Eye Contact:Fair  Speech:Clear and Coherent  Speech Volume:Normal  Handedness:Right   Mood and Affect  Mood:Depressed  Affect:Depressed   Thought Process  Thought Processes:Linear  Descriptions of Associations:Intact  Orientation:Full (Time, Place and Person)  Thought Content:Logical  History of Schizophrenia/Schizoaffective disorder:No  Duration of Psychotic Symptoms:No data recorded Hallucinations:Hallucinations: None  Ideas of Reference:None  Suicidal Thoughts:Suicidal Thoughts: No  Homicidal Thoughts:Homicidal Thoughts: No   Sensorium  Memory:Immediate Fair  Judgment:Fair  Insight:Shallow   Executive Functions  Concentration:Fair  Attention Span:Fair  Recall:Fair  Fund of Knowledge:Fair  Language:Fair   Psychomotor Activity  Psychomotor Activity:Psychomotor Activity: Decreased   Assets  Assets:Financial Resources/Insurance; Transportation; Communication Skills   Sleep  Sleep:Sleep: Poor    Physical Exam: Physical Exam Constitutional:       Comments: Very thin  HENT:     Head: Normocephalic.  Eyes:     Extraocular Movements: Extraocular movements intact.  Pulmonary:     Effort: Pulmonary effort is normal.  Neurological:     Mental Status: She is alert.   Review of Systems  Psychiatric/Behavioral:  Negative for depression, hallucinations and suicidal ideas.   Blood pressure (!) 133/93, pulse 89, temperature 98 F (36.7 C), temperature source Oral, resp. rate 18, height 5\' 3"  (1.6 m), weight 42.1 kg, SpO2 100 %. Body mass index is 16.44 kg/m.   Social History   Tobacco Use  Smoking Status Every Day   Packs/day: 1.00   Years: 30.00   Pack years: 30.00   Types: Cigarettes  Smokeless Tobacco Never  Tobacco Comments   Since age 79   Tobacco Cessation:  A prescription for an FDA-approved tobacco cessation medication provided at discharge   Blood Alcohol level:  Lab Results  Component Value Date   [facility] <10 08/25/2021    Metabolic Disorder Labs:  Lab Results  Component Value Date   HGBA1C 4.8 08/27/2021   MPG 91.06 08/27/2021  MPG 114.02 06/26/2019   No results found for: PROLACTIN Lab Results  Component Value Date   CHOL 168 08/27/2021   TRIG 70 08/27/2021   HDL 40 (L) 08/27/2021   CHOLHDL 4.2 08/27/2021   VLDL 14 08/27/2021   LDLCALC 114 (H) 08/27/2021   LDLCALC 92 06/26/2019    See Psychiatric Specialty Exam and Suicide Risk Assessment completed by Attending Physician prior to discharge.  Discharge destination:  Home  Is patient on multiple antipsychotic therapies at discharge:  No   Has Patient had three or more failed trials of antipsychotic monotherapy by history:  No  Recommended Plan for Multiple Antipsychotic Therapies: NA   Allergies as of 08/30/2021       Reactions   Mirabegron Hypertension        Medication List     STOP taking these medications    oxyCODONE 15 MG immediate release tablet Commonly known as: ROXICODONE   Oxycodone HCl 10 MG Tabs       TAKE these  medications      Indication  atorvastatin 40 MG tablet Commonly known as: LIPITOR Take 1 tablet (40 mg total) by mouth daily at 6 PM.  Indication: High Amount of Fats in the Blood   clopidogrel 75 MG tablet Commonly known as: PLAVIX Take 1 tablet (75 mg total) by mouth daily.  Indication: Historical   melatonin 5 MG Tabs Take 1 tablet (5 mg total) by mouth at bedtime.  Indication: Trouble Sleeping   methocarbamol 500 MG tablet Commonly known as: ROBAXIN Take 1 tablet (500 mg total) by mouth every 8 (eight) hours as needed for muscle spasms.  Indication: Musculoskeletal Pain   mirtazapine 15 MG tablet Commonly known as: REMERON Take 1 tablet (15 mg total) by mouth at bedtime.  Indication: Major Depressive Disorder   naproxen 500 MG tablet Commonly known as: NAPROSYN Take 1 tablet (500 mg total) by mouth 2 (two) times daily as needed (aching, pain, or discomfort).  Indication: Pain   nicotine 14 mg/24hr patch Commonly known as: NICODERM CQ - dosed in mg/24 hours Place 1 patch (14 mg total) onto the skin daily. Start taking on: August 31, 2021  Indication: Nicotine Addiction   traZODone 50 MG tablet Commonly known as: DESYREL Take 1 tablet (50 mg total) by mouth at bedtime.  Indication: Trouble Sleeping   Vitamin D (Ergocalciferol) 1.25 MG (50000 UNIT) Caps capsule Commonly known as: DRISDOL Take 1 capsule (50,000 Units total) by mouth every 7 (seven) days. Start taking on: September 04, 2021  Indication: Vitamin D Deficiency        Follow-up Information     Services, [facility] Follow up.   Why: Please go to this provider or call to schedule an appointment for therapy and medication management services. Contact information: [address] [place] [address] [phone]                 Follow-up recommendations:  Follow up recommendations: - Activity as tolerated. - Diet as recommended by PCP. - Keep all scheduled follow-up appointments  as recommended.   Comments:   Follow up recommendations: - Activity as tolerated. - Diet as recommended by PCP. - Keep all scheduled follow-up appointments as recommended.   Signed:  PGY-2 Bobbye Morton, MD 08/30/2021, 10:00 AM

## 2021-11-10 ENCOUNTER — Emergency Department (HOSPITAL_COMMUNITY): Payer: 59

## 2021-11-10 ENCOUNTER — Emergency Department (HOSPITAL_COMMUNITY)
Admission: EM | Admit: 2021-11-10 | Discharge: 2021-11-10 | Disposition: A | Discharge status: Home / Self Care | Payer: 59 | Attending: Emergency Medicine | Admitting: Emergency Medicine

## 2021-11-10 ENCOUNTER — Encounter (HOSPITAL_COMMUNITY): Payer: Self-pay

## 2021-11-10 ENCOUNTER — Other Ambulatory Visit: Payer: Self-pay

## 2021-11-10 DIAGNOSIS — Z7902 Long term (current) use of antithrombotics/antiplatelets: Secondary | ICD-10-CM | POA: Insufficient documentation

## 2021-11-10 DIAGNOSIS — X509XXA Other and unspecified overexertion or strenuous movements or postures, initial encounter: Secondary | ICD-10-CM | POA: Diagnosis not present

## 2021-11-10 DIAGNOSIS — R791 Abnormal coagulation profile: Secondary | ICD-10-CM | POA: Diagnosis not present

## 2021-11-10 DIAGNOSIS — Y92810 Car as the place of occurrence of the external cause: Secondary | ICD-10-CM | POA: Insufficient documentation

## 2021-11-10 DIAGNOSIS — R079 Chest pain, unspecified: Secondary | ICD-10-CM

## 2021-11-10 DIAGNOSIS — S299XXA Unspecified injury of thorax, initial encounter: Secondary | ICD-10-CM | POA: Insufficient documentation

## 2021-11-10 DIAGNOSIS — R0789 Other chest pain: Secondary | ICD-10-CM | POA: Diagnosis not present

## 2021-11-10 LAB — CBC WITH DIFFERENTIAL/PLATELET
Abs Immature Granulocytes: 0.02 10*3/uL (ref 0.00–0.07)
Basophils Absolute: 0 10*3/uL (ref 0.0–0.1)
Basophils Relative: 0 %
Eosinophils Absolute: 0 10*3/uL (ref 0.0–0.5)
Eosinophils Relative: 0 %
HCT: 52.3 % — ABNORMAL HIGH (ref 36.0–46.0)
Hemoglobin: 17.7 g/dL — ABNORMAL HIGH (ref 12.0–15.0)
Immature Granulocytes: 0 %
Lymphocytes Relative: 33 %
Lymphs Abs: 2.7 10*3/uL (ref 0.7–4.0)
MCH: 33.5 pg (ref 26.0–34.0)
MCHC: 33.8 g/dL (ref 30.0–36.0)
MCV: 99.1 fL (ref 80.0–100.0)
Monocytes Absolute: 0.5 10*3/uL (ref 0.1–1.0)
Monocytes Relative: 6 %
Neutro Abs: 4.8 10*3/uL (ref 1.7–7.7)
Neutrophils Relative %: 61 %
Platelets: 262 10*3/uL (ref 150–400)
RBC: 5.28 MIL/uL — ABNORMAL HIGH (ref 3.87–5.11)
RDW: 12.3 % (ref 11.5–15.5)
WBC: 8.1 10*3/uL (ref 4.0–10.5)
nRBC: 0 % (ref 0.0–0.2)

## 2021-11-10 LAB — TROPONIN I (HIGH SENSITIVITY)
Troponin I (High Sensitivity): 2 ng/L (ref <18)
Troponin I (High Sensitivity): 2 ng/L (ref <18)

## 2021-11-10 LAB — BASIC METABOLIC PANEL
Anion gap: 11 (ref 5–15)
BUN: 16 mg/dL (ref 6–20)
CO2: 25 mmol/L (ref 22–32)
Calcium: 9.5 mg/dL (ref 8.9–10.3)
Chloride: 101 mmol/L (ref 98–111)
Creatinine, Ser: 0.6 mg/dL (ref 0.44–1.00)
GFR, Estimated: >60 mL/min (ref >60)
Glucose, Bld: 97 mg/dL (ref 70–99)
Potassium: 4 mmol/L (ref 3.5–5.1)
Sodium: 137 mmol/L (ref 135–145)

## 2021-11-10 LAB — D-DIMER, QUANTITATIVE: D-Dimer, Quant: 1.11 ug/mL-FEU — ABNORMAL HIGH (ref 0.00–0.50)

## 2021-11-10 MED ORDER — IOHEXOL 350 MG/ML SOLN
100.0000 mL | Freq: Once | INTRAVENOUS | Status: AC | PRN
  Administered 2021-11-10: 75 mL via INTRAVENOUS

## 2021-11-10 NOTE — ED Triage Notes (Signed)
Patient complaining of pain to right axilla area. States that she was bending over pulling on something a few days ago and heard a pop. Area extremely painful to touch and patient states there is bruising to area. Pop felt on left side.  ?

## 2021-11-10 NOTE — Discharge Instructions (Signed)
You were seen in the emergency department for right-sided chest pain.  You had blood work EKG chest x-ray and a CAT scan of your chest that did not show any serious findings.  This pain is likely muscular.  You can use ice to the affected area and try Tylenol and ibuprofen.  Follow-up with your regular doctor.  Return to the emergency department if any worsening or concerning symptoms ?

## 2021-11-10 NOTE — ED Provider Notes (Incomplete)
[facility] EMERGENCY DEPARTMENT Provider Note   CSN: [id number] Arrival date & time: 11/10/21  1620     History {Add pertinent medical, surgical, social history, OB history to HPI:1} Chief Complaint  Patient presents with   Muscle Pain    Katelyn Smith is a 56 y.o. female.  She is here with a complaint of some right lateral upper chest pain into her right axilla that started last evening.  Worse with coughing and deep breathing raising her arm.  Rates it as 10 out of 10 yesterday now 7 out of 10.  Has tried nothing for it.  No known trauma.  She said she also injured her left lower lateral rib yesterday while leaning over in the car seat.  That is getting better.  She does not feel short of breath but she says it hurts when she takes of breath.  The history is provided by the patient.  Chest Pain Pain location:  R lateral chest Pain quality: stabbing   Pain severity:  Moderate Onset quality:  Sudden Duration:  2 days Timing:  Intermittent Progression:  Improving Chronicity:  New Relieved by:  Certain positions Worsened by:  Coughing and deep breathing Ineffective treatments:  None tried Associated symptoms: no abdominal pain, no cough, no diaphoresis, no fever, no nausea, no shortness of breath and no vomiting   Risk factors: coronary artery disease, high cholesterol and smoking       Home Medications Prior to Admission medications   Medication Sig Start Date End Date Taking? Authorizing Provider  atorvastatin (LIPITOR) 40 MG tablet Take 1 tablet (40 mg total) by mouth daily at 6 PM. 06/27/19   Metzger-Cihelka, Cristie Hem, NP  clopidogrel (PLAVIX) 75 MG tablet Take 1 tablet (75 mg total) by mouth daily. 06/27/19   Metzger-Cihelka, Desiree, NP  melatonin 5 MG TABS Take 1 tablet (5 mg total) by mouth at bedtime. 08/30/21   Bobbye Morton, MD  methocarbamol (ROBAXIN) 500 MG tablet Take 1 tablet (500 mg total) by mouth every 8 (eight) hours as needed for muscle spasms. 08/30/21    Bobbye Morton, MD  mirtazapine (REMERON) 15 MG tablet Take 1 tablet (15 mg total) by mouth at bedtime. 08/30/21   Bobbye Morton, MD  naproxen (NAPROSYN) 500 MG tablet Take 1 tablet (500 mg total) by mouth 2 (two) times daily as needed (aching, pain, or discomfort). 08/30/21   Bobbye Morton, MD  nicotine (NICODERM CQ - DOSED IN MG/24 HOURS) 14 mg/24hr patch Place 1 patch (14 mg total) onto the skin daily. 08/31/21   Bobbye Morton, MD  traZODone (DESYREL) 50 MG tablet Take 1 tablet (50 mg total) by mouth at bedtime. 08/30/21   Bobbye Morton, MD  Vitamin D, Ergocalciferol, (DRISDOL) 1.25 MG (50000 UNIT) CAPS capsule Take 1 capsule (50,000 Units total) by mouth every 7 (seven) days. 09/04/21   Bobbye Morton, MD      Allergies    Mirabegron    Review of Systems   Review of Systems  Constitutional:  Negative for diaphoresis and fever.  HENT:  Negative for sore throat.   Respiratory:  Negative for cough and shortness of breath.   Cardiovascular:  Positive for chest pain.  Gastrointestinal:  Negative for abdominal pain, nausea and vomiting.  Genitourinary:  Negative for dysuria.  Skin:  Negative for rash.   Physical Exam Updated Vital Signs BP 134/89    Pulse 80    Temp 97.9 F (36.6 C)  Resp (!) 23    Ht 5\' 3"  (1.6 m)    Wt 49.9 kg    SpO2 92%    BMI 19.49 kg/m  Physical Exam Vitals and nursing note reviewed.  Constitutional:      General: She is not in acute distress.    Appearance: Normal appearance. She is well-developed.  HENT:     Head: Normocephalic and atraumatic.  Eyes:     Conjunctiva/sclera: Conjunctivae normal.  Cardiovascular:     Rate and Rhythm: Normal rate and regular rhythm.     Heart sounds: No murmur heard. Pulmonary:     Effort: Pulmonary effort is normal. No respiratory distress.     Breath sounds: Normal breath sounds.  Chest:    Abdominal:     Palpations: Abdomen is soft.     Tenderness: There is no abdominal tenderness. There is no guarding or  rebound.  Musculoskeletal:        General: No swelling.     Cervical back: Neck supple.  Skin:    General: Skin is warm and dry.     Capillary Refill: Capillary refill takes less than 2 seconds.  Neurological:     General: No focal deficit present.     Mental Status: She is alert.    ED Results / Procedures / Treatments   Labs (all labs ordered are listed, but only abnormal results are displayed) Labs Reviewed  BASIC METABOLIC PANEL  CBC WITH DIFFERENTIAL/PLATELET  D-DIMER, QUANTITATIVE  TROPONIN I (HIGH SENSITIVITY)    EKG None  Radiology No results found.  Procedures Procedures  {Document cardiac monitor, telemetry assessment procedure when appropriate:1}  Medications Ordered in ED Medications - No data to display  ED Course/ Medical Decision Making/ A&P                           Medical Decision Making Amount and/or Complexity of Data Reviewed Labs: ordered. Radiology: ordered.   This patient complains of ***; this involves an extensive number of treatment Options and is a complaint that carries with it a high risk of complications and morbidity. The differential includes ***  I ordered, reviewed and interpreted labs, which included *** I ordered medication *** and reviewed PMP when indicated. I ordered imaging studies which included *** and I independently    visualized and interpreted imaging which showed *** Additional history obtained from *** Previous records obtained and reviewed *** I consulted *** and discussed lab and imaging findings and discussed disposition.  Cardiac monitoring reviewed, *** Social determinants considered, *** Critical Interventions: ***  After the interventions stated above, I reevaluated the patient and found *** Admission and further testing considered, ***   {Document critical care time when appropriate:1} {Document review of labs and clinical decision tools ie heart score, Chads2Vasc2 etc:1}  {Document your  independent review of radiology images, and any outside records:1} {Document your discussion with family members, caretakers, and with consultants:1} {Document social determinants of health affecting pt's care:1} {Document your decision making why or why not admission, treatments were needed:1} Final Clinical Impression(s) / ED Diagnoses Final diagnoses:  None    Rx / DC Orders ED Discharge Orders     None

## 2022-05-18 DIAGNOSIS — M47816 Spondylosis without myelopathy or radiculopathy, lumbar region: Secondary | ICD-10-CM | POA: Diagnosis not present

## 2022-05-18 DIAGNOSIS — G894 Chronic pain syndrome: Secondary | ICD-10-CM | POA: Diagnosis not present

## 2022-05-18 DIAGNOSIS — R69 Illness, unspecified: Secondary | ICD-10-CM | POA: Diagnosis not present

## 2022-05-18 DIAGNOSIS — I10 Essential (primary) hypertension: Secondary | ICD-10-CM | POA: Diagnosis not present

## 2022-06-19 DIAGNOSIS — M47816 Spondylosis without myelopathy or radiculopathy, lumbar region: Secondary | ICD-10-CM | POA: Diagnosis not present

## 2022-06-19 DIAGNOSIS — I10 Essential (primary) hypertension: Secondary | ICD-10-CM | POA: Diagnosis not present

## 2022-06-19 DIAGNOSIS — G894 Chronic pain syndrome: Secondary | ICD-10-CM | POA: Diagnosis not present

## 2022-07-20 DIAGNOSIS — I10 Essential (primary) hypertension: Secondary | ICD-10-CM | POA: Diagnosis not present

## 2022-07-20 DIAGNOSIS — Z681 Body mass index (BMI) 19 or less, adult: Secondary | ICD-10-CM | POA: Diagnosis not present

## 2022-07-20 DIAGNOSIS — I639 Cerebral infarction, unspecified: Secondary | ICD-10-CM | POA: Diagnosis not present

## 2022-07-20 DIAGNOSIS — M47816 Spondylosis without myelopathy or radiculopathy, lumbar region: Secondary | ICD-10-CM | POA: Diagnosis not present

## 2022-07-20 DIAGNOSIS — G894 Chronic pain syndrome: Secondary | ICD-10-CM | POA: Diagnosis not present

## 2022-09-10 DIAGNOSIS — G894 Chronic pain syndrome: Secondary | ICD-10-CM | POA: Diagnosis not present

## 2022-09-10 DIAGNOSIS — R69 Illness, unspecified: Secondary | ICD-10-CM | POA: Diagnosis not present

## 2022-09-10 DIAGNOSIS — I700 Atherosclerosis of aorta: Secondary | ICD-10-CM | POA: Diagnosis not present

## 2023-01-14 DIAGNOSIS — M47816 Spondylosis without myelopathy or radiculopathy, lumbar region: Secondary | ICD-10-CM | POA: Diagnosis not present

## 2023-01-14 DIAGNOSIS — I10 Essential (primary) hypertension: Secondary | ICD-10-CM | POA: Diagnosis not present

## 2023-01-14 DIAGNOSIS — N390 Urinary tract infection, site not specified: Secondary | ICD-10-CM | POA: Diagnosis not present

## 2023-01-14 DIAGNOSIS — G894 Chronic pain syndrome: Secondary | ICD-10-CM | POA: Diagnosis not present

## 2023-03-08 ENCOUNTER — Emergency Department (HOSPITAL_COMMUNITY): Payer: 59

## 2023-03-08 ENCOUNTER — Observation Stay (HOSPITAL_COMMUNITY)
Admission: EM | Admit: 2023-03-08 | Discharge: 2023-03-09 | Disposition: A | Discharge status: Home / Self Care | Payer: 59 | Attending: Internal Medicine | Admitting: Internal Medicine

## 2023-03-08 ENCOUNTER — Other Ambulatory Visit: Payer: Self-pay

## 2023-03-08 DIAGNOSIS — Z79899 Other long term (current) drug therapy: Secondary | ICD-10-CM | POA: Diagnosis not present

## 2023-03-08 DIAGNOSIS — I6529 Occlusion and stenosis of unspecified carotid artery: Secondary | ICD-10-CM | POA: Diagnosis not present

## 2023-03-08 DIAGNOSIS — F17210 Nicotine dependence, cigarettes, uncomplicated: Secondary | ICD-10-CM | POA: Insufficient documentation

## 2023-03-08 DIAGNOSIS — Z720 Tobacco use: Secondary | ICD-10-CM | POA: Diagnosis present

## 2023-03-08 DIAGNOSIS — R200 Anesthesia of skin: Principal | ICD-10-CM | POA: Diagnosis present

## 2023-03-08 DIAGNOSIS — I10 Essential (primary) hypertension: Secondary | ICD-10-CM | POA: Diagnosis not present

## 2023-03-08 DIAGNOSIS — I6501 Occlusion and stenosis of right vertebral artery: Secondary | ICD-10-CM | POA: Diagnosis not present

## 2023-03-08 DIAGNOSIS — F332 Major depressive disorder, recurrent severe without psychotic features: Secondary | ICD-10-CM | POA: Diagnosis present

## 2023-03-08 DIAGNOSIS — R202 Paresthesia of skin: Secondary | ICD-10-CM | POA: Diagnosis not present

## 2023-03-08 DIAGNOSIS — Z8673 Personal history of transient ischemic attack (TIA), and cerebral infarction without residual deficits: Secondary | ICD-10-CM | POA: Diagnosis not present

## 2023-03-08 DIAGNOSIS — E785 Hyperlipidemia, unspecified: Secondary | ICD-10-CM | POA: Diagnosis not present

## 2023-03-08 DIAGNOSIS — I2510 Atherosclerotic heart disease of native coronary artery without angina pectoris: Secondary | ICD-10-CM | POA: Diagnosis not present

## 2023-03-08 DIAGNOSIS — G894 Chronic pain syndrome: Secondary | ICD-10-CM | POA: Diagnosis present

## 2023-03-08 DIAGNOSIS — G459 Transient cerebral ischemic attack, unspecified: Secondary | ICD-10-CM | POA: Diagnosis not present

## 2023-03-08 DIAGNOSIS — Z7902 Long term (current) use of antithrombotics/antiplatelets: Secondary | ICD-10-CM | POA: Diagnosis not present

## 2023-03-08 DIAGNOSIS — R0689 Other abnormalities of breathing: Secondary | ICD-10-CM | POA: Diagnosis not present

## 2023-03-08 DIAGNOSIS — I6782 Cerebral ischemia: Secondary | ICD-10-CM | POA: Diagnosis not present

## 2023-03-08 DIAGNOSIS — R29898 Other symptoms and signs involving the musculoskeletal system: Secondary | ICD-10-CM | POA: Diagnosis not present

## 2023-03-08 LAB — DIFFERENTIAL
Abs Immature Granulocytes: 0.02 10*3/uL (ref 0.00–0.07)
Basophils Absolute: 0 10*3/uL (ref 0.0–0.1)
Basophils Relative: 1 %
Eosinophils Absolute: 0.1 10*3/uL (ref 0.0–0.5)
Eosinophils Relative: 1 %
Immature Granulocytes: 0 %
Lymphocytes Relative: 34 %
Lymphs Abs: 2.2 10*3/uL (ref 0.7–4.0)
Monocytes Absolute: 0.3 10*3/uL (ref 0.1–1.0)
Monocytes Relative: 4 %
Neutro Abs: 3.8 10*3/uL (ref 1.7–7.7)
Neutrophils Relative %: 60 %

## 2023-03-08 LAB — I-STAT CHEM 8, ED
BUN: 14 mg/dL (ref 6–20)
Calcium, Ion: 1.18 mmol/L (ref 1.15–1.40)
Chloride: 107 mmol/L (ref 98–111)
Creatinine, Ser: 0.7 mg/dL (ref 0.44–1.00)
Glucose, Bld: 90 mg/dL (ref 70–99)
HCT: 40 % (ref 36.0–46.0)
Hemoglobin: 13.6 g/dL (ref 12.0–15.0)
Potassium: 4 mmol/L (ref 3.5–5.1)
Sodium: 141 mmol/L (ref 135–145)
TCO2: 27 mmol/L (ref 22–32)

## 2023-03-08 LAB — CBC
HCT: 40.8 % (ref 36.0–46.0)
Hemoglobin: 13.8 g/dL (ref 12.0–15.0)
MCH: 33.7 pg (ref 26.0–34.0)
MCHC: 33.8 g/dL (ref 30.0–36.0)
MCV: 99.8 fL (ref 80.0–100.0)
Platelets: 218 10*3/uL (ref 150–400)
RBC: 4.09 MIL/uL (ref 3.87–5.11)
RDW: 12.4 % (ref 11.5–15.5)
WBC: 6.4 10*3/uL (ref 4.0–10.5)
nRBC: 0 % (ref 0.0–0.2)

## 2023-03-08 LAB — COMPREHENSIVE METABOLIC PANEL
ALT: 15 U/L (ref 0–44)
AST: 19 U/L (ref 15–41)
Albumin: 4 g/dL (ref 3.5–5.0)
Alkaline Phosphatase: 72 U/L (ref 38–126)
Anion gap: 8 (ref 5–15)
BUN: 14 mg/dL (ref 6–20)
CO2: 24 mmol/L (ref 22–32)
Calcium: 8.9 mg/dL (ref 8.9–10.3)
Chloride: 105 mmol/L (ref 98–111)
Creatinine, Ser: 0.68 mg/dL (ref 0.44–1.00)
GFR, Estimated: >60 mL/min (ref >60)
Glucose, Bld: 96 mg/dL (ref 70–99)
Potassium: 4.7 mmol/L (ref 3.5–5.1)
Sodium: 137 mmol/L (ref 135–145)
Total Bilirubin: 0.7 mg/dL (ref 0.3–1.2)
Total Protein: 7 g/dL (ref 6.5–8.1)

## 2023-03-08 LAB — PROTIME-INR
INR: 0.9 (ref 0.8–1.2)
Prothrombin Time: 12.4 seconds (ref 11.4–15.2)

## 2023-03-08 LAB — APTT: aPTT: 28 seconds (ref 24–36)

## 2023-03-08 LAB — ETHANOL: Alcohol, Ethyl (B): <10 mg/dL (ref <10)

## 2023-03-08 MED ORDER — ASPIRIN 325 MG PO TABS
325.0000 mg | ORAL_TABLET | Freq: Once | ORAL | Status: AC
  Administered 2023-03-08: 325 mg via ORAL
  Filled 2023-03-08: qty 1

## 2023-03-08 MED ORDER — OXYCODONE HCL 5 MG PO TABS
15.0000 mg | ORAL_TABLET | Freq: Three times a day (TID) | ORAL | Status: DC | PRN
  Administered 2023-03-08: 15 mg via ORAL
  Filled 2023-03-08: qty 3

## 2023-03-08 MED ORDER — ATORVASTATIN CALCIUM 40 MG PO TABS: 40.0000 mg | ORAL_TABLET | Freq: Every day | ORAL | Status: DC

## 2023-03-08 MED ORDER — STROKE: EARLY STAGES OF RECOVERY BOOK
Freq: Once | Status: AC
  Filled 2023-03-08: qty 1

## 2023-03-08 MED ORDER — ASPIRIN 81 MG PO TBEC
81.0000 mg | DELAYED_RELEASE_TABLET | Freq: Every day | ORAL | Status: DC
  Administered 2023-03-09: 81 mg via ORAL
  Filled 2023-03-08: qty 1

## 2023-03-08 MED ORDER — IOHEXOL 350 MG/ML SOLN
75.0000 mL | Freq: Once | INTRAVENOUS | Status: AC | PRN
  Administered 2023-03-08: 75 mL via INTRAVENOUS

## 2023-03-08 MED ORDER — ATORVASTATIN CALCIUM 40 MG PO TABS
40.0000 mg | ORAL_TABLET | Freq: Every day | ORAL | Status: DC
  Administered 2023-03-08: 40 mg via ORAL
  Filled 2023-03-08: qty 1

## 2023-03-08 MED ORDER — CLOPIDOGREL BISULFATE 75 MG PO TABS
75.0000 mg | ORAL_TABLET | Freq: Every day | ORAL | Status: DC
  Administered 2023-03-09: 75 mg via ORAL
  Filled 2023-03-08: qty 1

## 2023-03-08 MED ORDER — CLOPIDOGREL BISULFATE 75 MG PO TABS: 75.0000 mg | ORAL_TABLET | Freq: Every day | ORAL | Status: DC

## 2023-03-08 NOTE — ED Notes (Signed)
Patient returned from MRI at this time.  

## 2023-03-08 NOTE — Progress Notes (Signed)
Stroke Neurology Consultation Note  Consult Requested by: Dr. Hyacinth Meeker  Reason for Consult: left foot numbness  Consult Date: 03/08/23   The history was obtained from the pt.  During history and examination, all items were able to obtain unless otherwise noted.  History of Present Illness:  Katelyn Smith is a 57 y.o. Caucasian female with PMH of hyperlipidemia, smoker, depression, anxiety, lower back pain, stroke in 06/2019 presented to ED for code stroke.  Around 2 PM this afternoon, patient still got up and walking had a sudden onset left foot numbness, lasted 15 minutes and resolved.  She denies any weakness, facial droop, slurred speech or vision changes.  10 minutes after symptoms came back again with left foot numbness tingling spreading to left leg.  Symptoms lasted about 15 minutes and resolved except left toes some residual tingling and numbness.  CT reported probable acute infarct in the right parietal lobe, however on my review, I am not fully convinced, could be part of the white matter ischemic changes or chronic small right parietal infarct.  However, she does have chronic right caudate head infarct which was not showing on her previous CT and MRI in 06/2019.  She had right thalamic small infarct in 06/2019 status post tPA treatment.  She had a symptoms of left arm numbness and left leg weakness at that time.  MRA no large vessel stenosis.  EF 60%, LDL 92, A1c 5.6.  Discharged on DAPT and Lipitor 40.  Currently patient on Plavix and Lipitor at home.  Patient still smoking, half pack per day.  Denies any alcohol or illicit drugs.  LSN: 2 PM today tPA Given: No: Non disabled symptoms IR: No, no sign of LVO MRS: 1  Past Medical History:  Diagnosis Date   Anxiety    Chest pain    a. 07/2016: Stress test showing no evidence of ischemia. b. cath 08/2016: 40% Ost RCA stenosis, 70% Lateral 3rd Mrg, and 80% Acute Mrg (too small for intervention, therefore medical therapy recommended).     Chronic back pain    Chronic neck pain    Depression    H/O degenerative disc disease    High cholesterol    Lumbar radiculopathy, right    Stroke (HCC) 10/2019   Tobacco abuse     Past Surgical History:  Procedure Laterality Date   BACK SURGERY     CARDIAC CATHETERIZATION N/A 09/09/2016   Procedure: Left Heart Cath and Coronary Angiography;  Surgeon: Peter M [place], MD;  Location: [facility] INVASIVE CV LAB;  Service: Cardiovascular;  Laterality: N/A;   CARDIAC CATHETERIZATION  09/09/2016   TUBAL LIGATION      Family History  Problem Relation Age of Onset   Heart failure Mother    Heart disease Mother 38       first MI early 86s   Cancer Father        Died at 24   Cancer Sister    Stroke Other    Heart disease Other    Stroke Maternal Grandmother     Social History:  reports that she has been smoking cigarettes. She has a 30.00 pack-year smoking history. She has never used smokeless tobacco. She reports that she does not drink alcohol and does not use drugs.  Allergies:  Allergies  Allergen Reactions   Mirabegron Hypertension    No current facility-administered medications on file prior to encounter.   Current Outpatient Medications on File Prior to Encounter  Medication Sig Dispense Refill  atorvastatin (LIPITOR) 40 MG tablet Take 1 tablet (40 mg total) by mouth daily at 6 PM. 30 tablet 3   clopidogrel (PLAVIX) 75 MG tablet Take 1 tablet (75 mg total) by mouth daily. 30 tablet 3   melatonin 5 MG TABS Take 1 tablet (5 mg total) by mouth at bedtime. 30 tablet 0   methocarbamol (ROBAXIN) 500 MG tablet Take 1 tablet (500 mg total) by mouth every 8 (eight) hours as needed for muscle spasms. 10 tablet 0   mirtazapine (REMERON) 15 MG tablet Take 1 tablet (15 mg total) by mouth at bedtime. 30 tablet 0   naproxen (NAPROSYN) 500 MG tablet Take 1 tablet (500 mg total) by mouth 2 (two) times daily as needed (aching, pain, or discomfort). 30 tablet 0   nicotine (NICODERM CQ - DOSED  IN MG/24 HOURS) 14 mg/24hr patch Place 1 patch (14 mg total) onto the skin daily. 28 patch 0   traZODone (DESYREL) 50 MG tablet Take 1 tablet (50 mg total) by mouth at bedtime. 30 tablet 0   Vitamin D, Ergocalciferol, (DRISDOL) 1.25 MG (50000 UNIT) CAPS capsule Take 1 capsule (50,000 Units total) by mouth every 7 (seven) days. 5 capsule 0    Review of Systems: A full ROS was attempted today and was able to be performed.  Systems assessed include - Constitutional, Eyes, HENT, Respiratory, Cardiovascular, Gastrointestinal, Genitourinary, Integument/breast, Hematologic/lymphatic, Musculoskeletal, Neurological, Behavioral/Psych, Endocrine, Allergic/Immunologic - with pertinent responses as per HPI.  Physical Examination: Pulse Rate:  [81] 81 (07/08 1633) Resp:  [17] 17 (07/08 1633) BP: (127)/(101) 127/101 (07/08 1632) SpO2:  [97 %] 97 % (07/08 1633)  General - well nourished, well developed, in no apparent distress.    Ophthalmologic - fundi not visualized due to noncooperation.    Cardiovascular - regular rhythm and rate  Mental Status -  Level of arousal and orientation to time, place, and person were intact. Language including expression, naming, repetition, comprehension was assessed and found intact. Attention span and concentration were normal. Fund of Knowledge was assessed and was intact.  Cranial Nerves II - XII - II - Vision intact OU. III, IV, VI - Extraocular movements intact. V - Facial sensation intact bilaterally. VII - Facial movement intact bilaterally. VIII - Hearing & vestibular intact bilaterally. X - Palate elevates symmetrically. XI - Chin turning & shoulder shrug intact bilaterally. XII - Tongue protrusion intact.  Motor Strength - The patient's strength was normal in all extremities and pronator drift was absent.   Motor Tone & Bulk - Muscle tone was assessed at the neck and appendages and was normal.  Bulk was normal and fasciculations were absent.    Reflexes - The patient's reflexes were normal in all extremities and she had no pathological reflexes.  Sensory - Light touch, temperature/pinprick were assessed and were normal except mild left toe decreased light touch sensation.    Coordination - The patient had normal movements in the hands and feet with no ataxia or dysmetria.  Tremor was absent.  Gait and Station - deferred  NIHSS = 1 for sensory  Data Reviewed: CT HEAD CODE STROKE WO CONTRAST  Result Date: 03/08/2023 CLINICAL DATA:  Code stroke.  Left lower extremity weakness EXAM: CT HEAD WITHOUT CONTRAST TECHNIQUE: Contiguous axial images were obtained from the base of the skull through the vertex without intravenous contrast. RADIATION DOSE REDUCTION: This exam was performed according to the departmental dose-optimization program which includes automated exposure control, adjustment of the mA and/or kV according to  patient size and/or use of iterative reconstruction technique. COMPARISON:  CT Head 06/25/19 FINDINGS: Brain: Possible acute infarct in the right parietal lobe (series 5, image 12-13). No hemorrhage. No hydrocephalus. No extra-axial fluid collection. Chronic infarct in the right caudate head. Vascular: No hyperdense vessel or unexpected calcification. Skull: Normal. Negative for fracture or focal lesion. Sinuses/Orbits: No middle ear or mastoid effusion. Paranasal sinuses are clear. Orbits are unremarkable. Other: None. ASPECTS [facility] Stroke Program Early CT Score): 9 IMPRESSION: Probable acute infarct in the right parietal lobe. No hemorrhage. Aspects is 9. Findings were communicated with Dr. Hyacinth Meeker on 03/08/23 at 4:35 PM via telephone. Electronically Signed   By: Lorenza Cambridge M.D.   On: 03/08/2023 16:35    Assessment: 57 y.o. female with PMH of hyperlipidemia, smoker, depression, anxiety, lower back pain, stroke in 06/2019 presented to ED for left foot numbness tingling spreading to left leg.  Symptoms occur colitis lasted  about 15 minutes each and now resolved except left toes some residual tingling and numbness.  CT reported probable acute infarct in the right parietal lobe, however on my review, I am not fully convinced, could be part of the white matter ischemic changes or chronic small right parietal infarct.  However, she does have chronic right caudate head infarct which was not showing on her previous CT and MRI in 06/2019.  Patient not a TNK candidate given nondisabling symptoms.  No IR given no sign of LVO.  Will continue stroke workup with MRI, CT head and neck and 2D echo.  Stroke Risk Factors - hyperlipidemia and history of stroke  Plan: Recommend admission and continue further stroke work up  Frequent neuro checks Telemetry monitoring MRI brain  CTA head and neck Echocardiogram  UDS, fasting lipid panel and HgbA1C Permissive hypertension (only treat if BP > 220/120 unless a lower blood pressure is clinically necessary) for 24-48 hours post stroke onset GI and DVT prophylaxis  Continue home Plavix.  Add aspirin 81 for DAPT once p.o. access. Continue home statin Discussed with Dr. Hyacinth Meeker ED physician We will follow   Consult Participants: Patient, RN, stroke response RN, me Location of the provider: [facility] Location of the patient: [facility]  Time Code Stroke Page received:  1424 Time neurologist arrived:  1427 Time NIHSS completed: 1445    This consult was provided via telemedicine with 2-way video and audio communication. The patient/family was informed that care would be provided in this way and agreed to receive care in this manner.    Marvel Plan, MD PhD Stroke Neurology 03/08/2023 5:07 PM

## 2023-03-08 NOTE — ED Notes (Signed)
Patient transported to CT via stretcher at this time.  

## 2023-03-08 NOTE — ED Notes (Signed)
ED TO INPATIENT HANDOFF REPORT  ED Nurse Name and Phone #: Binnie Rail [phone]  S Name/Age/Gender Katelyn Smith 57 y.o. female Room/Bed: APA17/APA17  Code Status   Code Status: Prior  Home/SNF/Other Home Patient oriented to: self, place, time, and situation Is this baseline? Yes   Triage Complete: Triage complete  Chief Complaint Left sided numbness [R20.0]  Triage Note Pt BIB [facility] from home c/o sudden onset L side weakness/numbness that began in toes and progressed to L hand and mouth. Some tingling in lips.   Pt has hx of stroke 2 years ago.   Grip strength equal, face symmetrical, no slurred speech. NIH 0 at this time.   EMS endorses hypertension PTA, states pt was ambulatory to stretcher. 18 g L FA.  Pt diagnosed with UTI 1 week ago, currently on cipro.   Pt A & O x 4, NAD.   EDP Hyacinth Meeker, MD at bedside.    Allergies Allergies  Allergen Reactions   Mirabegron Hypertension    Level of Care/Admitting Diagnosis ED Disposition     ED Disposition  Admit   Condition  --   Comment  Hospital Area: [facility] [[id number]]  Level of Care: Telemetry [5]  Covid Evaluation: Asymptomatic - no recent exposure (last 10 days) testing not required  Diagnosis: Left sided numbness [722450]  Admitting Physician: Onnie Boer [[id number]]  Attending Physician: Onnie Boer [email or web address]          B Medical/Surgery History Past Medical History:  Diagnosis Date   Anxiety    Chest pain    a. 07/2016: Stress test showing no evidence of ischemia. b. cath 08/2016: 40% Ost RCA stenosis, 70% Lateral 3rd Mrg, and 80% Acute Mrg (too small for intervention, therefore medical therapy recommended).    Chronic back pain    Chronic neck pain    Depression    H/O degenerative disc disease    High cholesterol    Lumbar radiculopathy, right    Stroke (HCC) 10/2019   Tobacco abuse    Past Surgical History:  Procedure Laterality Date   BACK SURGERY      CARDIAC CATHETERIZATION N/A 09/09/2016   Procedure: Left Heart Cath and Coronary Angiography;  Surgeon: Peter M Swaziland, MD;  Location: [facility] INVASIVE CV LAB;  Service: Cardiovascular;  Laterality: N/A;   CARDIAC CATHETERIZATION  09/09/2016   TUBAL LIGATION       A IV Location/Drains/Wounds Patient Lines/Drains/Airways Status     Active Line/Drains/Airways     Name Placement date Placement time Site Days   Peripheral IV 03/08/23 18 G Anterior;Left Forearm 03/08/23  --  Forearm  less than 1            Intake/Output Last 24 hours No intake or output data in the 24 hours ending 03/08/23 1841  Labs/Imaging Results for orders placed or performed during the hospital encounter of 03/08/23 (from the past 48 hour(s))  Ethanol     Status: None   Collection Time: 03/08/23  4:26 PM  Result Value Ref Range   Alcohol, Ethyl (B) <10 <10 mg/dL    Comment: (NOTE) Lowest detectable limit for serum alcohol is 10 mg/dL.  For medical purposes only. Performed at [facility], [address], [place], [place] [address]   Protime-INR     Status: None   Collection Time: 03/08/23  4:26 PM  Result Value Ref Range   Prothrombin Time 12.4 11.4 - 15.2 seconds   INR 0.9 0.8 - 1.2  Comment: (NOTE) INR goal varies based on device and disease states. Performed at [facility], [address], [place], [place] [address]   APTT     Status: None   Collection Time: 03/08/23  4:26 PM  Result Value Ref Range   aPTT 28 24 - 36 seconds    Comment: Performed at [facility], [address], [place], [place] [address]  CBC     Status: None   Collection Time: 03/08/23  4:26 PM  Result Value Ref Range   WBC 6.4 4.0 - 10.5 K/uL   RBC 4.09 3.87 - 5.11 MIL/uL   Hemoglobin 13.8 12.0 - 15.0 g/dL   HCT 19.1 47.8 - 29.5 %   MCV 99.8 80.0 - 100.0 fL   MCH 33.7 26.0 - 34.0 pg   MCHC 33.8 30.0 - 36.0 g/dL   RDW 62.1 30.8 - 65.7 %   Platelets 218 150 - 400 K/uL   nRBC 0.0 0.0 - 0.2 %    Comment: Performed at [facility], [address] [place], [place] [address]  Differential     Status: None   Collection Time: 03/08/23  4:26 PM  Result Value Ref Range   Neutrophils Relative % 60 %   Neutro Abs 3.8 1.7 - 7.7 K/uL   Lymphocytes Relative 34 %   Lymphs Abs 2.2 0.7 - 4.0 K/uL   Monocytes Relative 4 %   Monocytes Absolute 0.3 0.1 - 1.0 K/uL   Eosinophils Relative 1 %   Eosinophils Absolute 0.1 0.0 - 0.5 K/uL   Basophils Relative 1 %   Basophils Absolute 0.0 0.0 - 0.1 K/uL   Immature Granulocytes 0 %   Abs Immature Granulocytes 0.02 0.00 - 0.07 K/uL    Comment: Performed at [facility], [address], [place], [place] [address]  Comprehensive metabolic panel     Status: None   Collection Time: 03/08/23  4:26 PM  Result Value Ref Range   Sodium 137 135 - 145 mmol/L   Potassium 4.7 3.5 - 5.1 mmol/L   Chloride 105 98 - 111 mmol/L   CO2 24 22 - 32 mmol/L   Glucose, Bld 96 70 - 99 mg/dL    Comment: Glucose reference range applies only to samples taken after fasting for at least 8 hours.   BUN 14 6 - 20 mg/dL   Creatinine, Ser 4.13 0.44 - 1.00 mg/dL   Calcium 8.9 8.9 - 24.4 mg/dL   Total Protein 7.0 6.5 - 8.1 g/dL   Albumin 4.0 3.5 - 5.0 g/dL   AST 19 15 - 41 U/L   ALT 15 0 - 44 U/L   Alkaline Phosphatase 72 38 - 126 U/L   Total Bilirubin 0.7 0.3 - 1.2 mg/dL   GFR, Estimated >01 >02 mL/min    Comment: (NOTE) Calculated using the CKD-EPI Creatinine Equation (2021)    Anion gap 8 5 - 15    Comment: Performed at [facility], [address] [place], [place] [address]  I-stat chem 8, ED     Status: None   Collection Time: 03/08/23  4:39 PM  Result Value Ref Range   Sodium 141 135 - 145 mmol/L   Potassium 4.0 3.5 - 5.1 mmol/L   Chloride 107 98 - 111 mmol/L   BUN 14 6 - 20 mg/dL   Creatinine, Ser 6.44 0.44 - 1.00 mg/dL   Glucose, Bld 90 70 - 99 mg/dL    Comment: Glucose reference range applies only to samples taken  after fasting for at least 8 hours.   Calcium, Ion 1.18 1.15 - 1.40 mmol/L    TCO2 27 22 - 32 mmol/L   Hemoglobin 13.6 12.0 - 15.0 g/dL   HCT 40.9 81.1 - 91.4 %   MR BRAIN WO CONTRAST  Result Date: 03/08/2023 CLINICAL DATA:  Stroke, follow up EXAM: MRI HEAD WITHOUT CONTRAST TECHNIQUE: Multiplanar, multiecho pulse sequences of the brain and surrounding structures were obtained without intravenous contrast. COMPARISON:  Same day CT head. FINDINGS: Brain: No acute infarction, hemorrhage, hydrocephalus, extra-axial collection or mass lesion. Moderate, age advanced T2/FLAIR hyperintensities in the white matter, nonspecific but compatible with chronic microvascular ischemic change. Remote infarct in the right frontal white matter. Vascular: Major arterial flow voids are maintained at the skull base. Skull and upper cervical spine: Normal marrow signal. Sinuses/Orbits: Clear sinuses.  No acute orbital findings. Other: No mastoid effusions IMPRESSION: 1. No evidence of acute intracranial abnormality. 2. Moderate chronic microvascular ischemic disease. Electronically Signed   By: Feliberto Harts M.D.   On: 03/08/2023 17:45   CT ANGIO HEAD NECK W WO CM  Result Date: 03/08/2023 CLINICAL DATA:  Provided history: Stroke/TIA, determine embolic source. Left lower extremity weakness. EXAM: CT ANGIOGRAPHY HEAD AND NECK WITH AND WITHOUT CONTRAST TECHNIQUE: Multidetector CT imaging of the head and neck was performed using the standard protocol during bolus administration of intravenous contrast. Multiplanar CT image reconstructions and MIPs were obtained to evaluate the vascular anatomy. Carotid stenosis measurements (when applicable) are obtained utilizing NASCET criteria, using the distal internal carotid diameter as the denominator. RADIATION DOSE REDUCTION: This exam was performed according to the departmental dose-optimization program which includes automated exposure control, adjustment of the mA and/or kV according to patient size and/or use of iterative reconstruction technique. CONTRAST:   75mL OMNIPAQUE IOHEXOL 350 MG/ML SOLN COMPARISON:  Same-day brain MRI 03/08/2023. Same day noncontrast head CT 03/08/2023. FINDINGS: CTA NECK FINDINGS Aortic arch: Atherosclerotic plaque within the visualized aortic arch and proximal major branch vessels of the neck. The innominate and left common carotid artery origins are excluded from the field of view. Within this limitation, there is no appreciable hemodynamically significant innominate or proximal subclavian artery stenosis. Right carotid system: CCA and ICA patent within the neck without stenosis. Mild atherosclerotic plaque scattered within the CCA and about the carotid bifurcation. Left carotid system: The origin of the common carotid artery is excluded from the field of view. Within this limitation, the common carotid and internal carotid arteries are patent within the neck without stenosis. Minimal atherosclerotic plaque within the proximal CCA and about the carotid bifurcation. Vertebral arteries: Vertebral arteries codominant and patent within the neck. Mild atherosclerotic plaque at the vertebral artery origins and within both vertebral arteries at the V3/V4 junction. Resultant mild stenosis of the right vertebral artery at the V3/V4 junction. Skeleton: Cervical spondylosis. No acute fracture or aggressive osseous lesion. Other neck: No cervical lymphadenopathy. 16 mm lipoma within the posterior right lower neck (for instance as seen on series 9, image 67) Upper chest: No consolidation within the imaged lung apices. Emphysema. Review of the MIP images confirms the above findings CTA HEAD FINDINGS Anterior circulation: The intracranial internal carotid arteries are patent. Atherosclerotic plaque within both vessels with no more than mild stenosis. The M1 middle cerebral arteries are patent. Sella early right MCA bifurcation. No M2 proximal branch occlusion or high-grade proximal stenosis. The anterior cerebral arteries are patent. No intracranial  aneurysm is identified. Posterior circulation: The intracranial vertebral arteries are patent.  Atherosclerotic plaque within the bilateral vertebral arteries at the P3/P4 junction (with mild stenosis on the right). The basilar artery is patent. The posterior cerebral arteries are patent. Posterior communicating arteries are diminutive or absent, bilaterally. Venous sinuses: Within the limitations of contrast timing, no convincing thrombus. Anatomic variants: As described. Review of the MIP images confirms the above findings IMPRESSION: CTA neck: 1. The origin of the left common carotid artery is excluded from the field of view. Within this limitation, the common carotid and internal carotid arteries are patent within the neck without stenosis. Mild atherosclerotic plaque bilaterally, as described. 2. The vertebral arteries patent within the neck. Mild atherosclerotic plaque within bilaterally. Mild stenosis of the right vertebral artery at the V3/V4 junction. 3. Aortic Atherosclerosis (ICD10-I70.0) and Emphysema (ICD10-J43.9). CTA head: 1. No intracranial large vessel occlusion or proximal high-grade arterial stenosis. 2. Intracranial atherosclerotic disease with sites of mild stenosis, as described. Electronically Signed   By: Jackey Loge D.O.   On: 03/08/2023 17:41   CT HEAD CODE STROKE WO CONTRAST  Result Date: 03/08/2023 CLINICAL DATA:  Code stroke.  Left lower extremity weakness EXAM: CT HEAD WITHOUT CONTRAST TECHNIQUE: Contiguous axial images were obtained from the base of the skull through the vertex without intravenous contrast. RADIATION DOSE REDUCTION: This exam was performed according to the departmental dose-optimization program which includes automated exposure control, adjustment of the mA and/or kV according to patient size and/or use of iterative reconstruction technique. COMPARISON:  CT Head 06/25/19 FINDINGS: Brain: Possible acute infarct in the right parietal lobe (series 5, image 12-13). No  hemorrhage. No hydrocephalus. No extra-axial fluid collection. Chronic infarct in the right caudate head. Vascular: No hyperdense vessel or unexpected calcification. Skull: Normal. Negative for fracture or focal lesion. Sinuses/Orbits: No middle ear or mastoid effusion. Paranasal sinuses are clear. Orbits are unremarkable. Other: None. ASPECTS [facility] Stroke Program Early CT Score): 9 IMPRESSION: Probable acute infarct in the right parietal lobe. No hemorrhage. Aspects is 9. Findings were communicated with Dr. Hyacinth Meeker on 03/08/23 at 4:35 PM via telephone. Electronically Signed   By: Lorenza Cambridge M.D.   On: 03/08/2023 16:35    Pending Labs Unresulted Labs (From admission, onward)     Start     Ordered   03/09/23 0500  Lipid panel  (Labs)  Tomorrow morning,   R       Comments: Fasting    03/08/23 1648   03/09/23 0500  Hemoglobin A1c  (Labs)  Tomorrow morning,   R       Comments: To assess prior glycemic control    03/08/23 1648   03/08/23 1612  Urine rapid drug screen (hosp performed)  Once,   STAT        03/08/23 1612   03/08/23 1612  Urinalysis, Routine w reflex microscopic -Urine, Clean Catch  Once,   URGENT       Question:  Specimen Source  Answer:  Urine, Clean Catch   03/08/23 1612            Vitals/Pain Today's Vitals   03/08/23 1645 03/08/23 1800 03/08/23 1815 03/08/23 1830  BP: (!) 144/99 134/83 (!) 143/97 138/86  Pulse: 74 78 72 69  Resp: 19     SpO2: 95% 97% 100% 97%    Isolation Precautions No active isolations  Medications Medications   stroke: early stages of recovery book (has no administration in time range)  aspirin tablet 325 mg (325 mg Oral Given 03/08/23 1656)    Followed by  aspirin EC tablet 81 mg (has no administration in time range)  clopidogrel (PLAVIX) tablet 75 mg (has no administration in time range)  atorvastatin (LIPITOR) tablet 40 mg (40 mg Oral Given 03/08/23 1759)  iohexol (OMNIPAQUE) 350 MG/ML injection 75 mL (75 mLs Intravenous Contrast Given  03/08/23 1712)    Mobility walks     Focused Assessments Neuro Assessment Handoff:  Swallow screen pass? Yes    NIH Stroke Scale  Dizziness Present: No Headache Present: No Interval: Initial Level of Consciousness (1a.)   : Alert, keenly responsive LOC Questions (1b. )   : Answers both questions correctly LOC Commands (1c. )   : Performs both tasks correctly Best Gaze (2. )  : Normal Visual (3. )  : No visual loss Facial Palsy (4. )    : Normal symmetrical movements Motor Arm, Left (5a. )   : No drift Motor Arm, Right (5b. ) : No drift Motor Leg, Left (6a. )  : No drift Motor Leg, Right (6b. ) : No drift Limb Ataxia (7. ): Absent Sensory (8. )  : Mild-to-moderate sensory loss, patient feels pinprick is less sharp or is dull on the affected side, or there is a loss of superficial pain with pinprick, but patient is aware of being touched (slight numbness/tingling present in L toes) Best Language (9. )  : No aphasia Dysarthria (10. ): Normal Extinction/Inattention (11.)   : No Abnormality Complete NIHSS TOTAL: 1 Last date known well: 03/08/23 Last time known well: 1400 Neuro Assessment: Within Defined Limits Neuro Checks:   Initial (03/08/23 1641)  Has TPA been given? No If patient is a Neuro Trauma and patient is going to OR before floor call report to 4N Charge nurse: [phone] or [phone]   R Recommendations: See Admitting Provider Note  Report given to:   Additional Notes:

## 2023-03-08 NOTE — ED Triage Notes (Signed)
Pt BIB [facility] from home c/o sudden onset L side weakness/numbness that began in toes and progressed to L hand and mouth. Some tingling in lips.   Pt has hx of stroke 2 years ago.   Grip strength equal, face symmetrical, no slurred speech. NIH 0 at this time.   EMS endorses hypertension PTA, states pt was ambulatory to stretcher. 18 g L FA.  Pt diagnosed with UTI 1 week ago, currently on cipro.   Pt A & O x 4, NAD.   EDP Hyacinth Meeker, MD at bedside.

## 2023-03-08 NOTE — Progress Notes (Addendum)
Code stroke activated per elert @ 1616.  Neurologist paged @ [phone], on camera to assess patient @ 1623.  Pt to CT @ 1617, returned to ER from CT @ 1633.  Notified of NCCT head results per ERP @ 1640.  MRS 0. No TNK, off camera @ 1644.   LKWT 1400  Social worker

## 2023-03-08 NOTE — Progress Notes (Signed)
Patient requested to have her confidential status removed from her record. Called ED registration and spoke with [facility] to have this changed.

## 2023-03-08 NOTE — ED Provider Notes (Signed)
[place] EMERGENCY DEPARTMENT AT [facility] Provider Note   CSN: [id number] Arrival date & time: 03/08/23  1552  An emergency department physician performed an initial assessment on this suspected stroke patient at 1615.  History {Add pertinent medical, surgical, social history, OB history to HPI:1} Chief Complaint  Patient presents with   Numbness    Left side   Code Stroke    Katelyn Smith is a 57 y.o. female.  HPI   Is a 57 year old female, history of prior stroke on Plavix, also takes a statin, has had a prior stroke that gave her unilateral numbness which eventually resolved.  She currently presents with a complaint of acute onset of left foot numbness which started at 2:00 and then resolved and then came back about 45 minutes later and has been present ever since.  Last seen normal was 2:45 PM.  She called for paramedic transport and noted that she started to have some tingling of her left hand as well.  On the ambulance truck she started to have some tingling of her lips that is since resolved.  Now she only has tingling and numbness on the left foot.  There is no weakness, she noticed that when she was tried to walk something felt abnormal in her foot but she did not notice any weakness.  Has no headache, no coughing or shortness of breath  Home Medications Prior to Admission medications   Medication Sig Start Date End Date Taking? Authorizing Provider  atorvastatin (LIPITOR) 40 MG tablet Take 1 tablet (40 mg total) by mouth daily at 6 PM. 06/27/19   Metzger-Cihelka, Cristie Hem, NP  clopidogrel (PLAVIX) 75 MG tablet Take 1 tablet (75 mg total) by mouth daily. 06/27/19   Metzger-Cihelka, Desiree, NP  melatonin 5 MG TABS Take 1 tablet (5 mg total) by mouth at bedtime. 08/30/21   Bobbye Morton, MD  methocarbamol (ROBAXIN) 500 MG tablet Take 1 tablet (500 mg total) by mouth every 8 (eight) hours as needed for muscle spasms. 08/30/21   Bobbye Morton, MD  mirtazapine  (REMERON) 15 MG tablet Take 1 tablet (15 mg total) by mouth at bedtime. 08/30/21   Bobbye Morton, MD  naproxen (NAPROSYN) 500 MG tablet Take 1 tablet (500 mg total) by mouth 2 (two) times daily as needed (aching, pain, or discomfort). 08/30/21   Bobbye Morton, MD  nicotine (NICODERM CQ - DOSED IN MG/24 HOURS) 14 mg/24hr patch Place 1 patch (14 mg total) onto the skin daily. 08/31/21   Bobbye Morton, MD  traZODone (DESYREL) 50 MG tablet Take 1 tablet (50 mg total) by mouth at bedtime. 08/30/21   Bobbye Morton, MD  Vitamin D, Ergocalciferol, (DRISDOL) 1.25 MG (50000 UNIT) CAPS capsule Take 1 capsule (50,000 Units total) by mouth every 7 (seven) days. 09/04/21   Bobbye Morton, MD      Allergies    Mirabegron    Review of Systems   Review of Systems  All other systems reviewed and are negative.   Physical Exam Updated Vital Signs BP (!) 127/101   Pulse 81   Resp 17   SpO2 97%  Physical Exam Vitals and nursing note reviewed.  Constitutional:      General: She is not in acute distress.    Appearance: She is well-developed.  HENT:     Head: Normocephalic and atraumatic.     Mouth/Throat:     Pharynx: No oropharyngeal exudate.  Eyes:  General: No scleral icterus.       Right eye: No discharge.        Left eye: No discharge.     Conjunctiva/sclera: Conjunctivae normal.     Pupils: Pupils are equal, round, and reactive to light.  Neck:     Thyroid: No thyromegaly.     Vascular: No JVD.  Cardiovascular:     Rate and Rhythm: Normal rate and regular rhythm.     Heart sounds: Normal heart sounds. No murmur heard.    No friction rub. No gallop.  Pulmonary:     Effort: Pulmonary effort is normal. No respiratory distress.     Breath sounds: Normal breath sounds. No wheezing or rales.  Abdominal:     General: Bowel sounds are normal. There is no distension.     Palpations: Abdomen is soft. There is no mass.     Tenderness: There is no abdominal tenderness.  Musculoskeletal:         General: No tenderness. Normal range of motion.     Cervical back: Normal range of motion and neck supple.  Lymphadenopathy:     Cervical: No cervical adenopathy.  Skin:    General: Skin is warm and dry.     Findings: No erythema or rash.  Neurological:     Mental Status: She is alert.     Coordination: Coordination normal.     Comments: Speech is clear, cranial nerves III through XII are intact, Katelyn is intact, strength is normal in all 4 extremities including grips, sensation is intact to light touch and pinprick in all 4 extremities. Coordination as tested by finger-nose-finger is normal, no limb ataxia. Normal gait, normal reflexes at the patellar tendons bilaterally  The only abnormal finding is there is some numbness to the left foot  Psychiatric:        Behavior: Behavior normal.     ED Results / Procedures / Treatments   Labs (all labs ordered are listed, but only abnormal results are displayed) Labs Reviewed  PROTIME-INR  APTT  CBC  DIFFERENTIAL  ETHANOL  COMPREHENSIVE METABOLIC PANEL  RAPID URINE DRUG SCREEN, HOSP PERFORMED  URINALYSIS, ROUTINE W REFLEX MICROSCOPIC  I-STAT CHEM 8, ED    EKG None  Radiology CT HEAD CODE STROKE WO CONTRAST  Result Date: 03/08/2023 CLINICAL DATA:  Code stroke.  Left lower extremity weakness EXAM: CT HEAD WITHOUT CONTRAST TECHNIQUE: Contiguous axial images were obtained from the base of the skull through the vertex without intravenous contrast. RADIATION DOSE REDUCTION: This exam was performed according to the departmental dose-optimization program which includes automated exposure control, adjustment of the mA and/or kV according to patient size and/or use of iterative reconstruction technique. COMPARISON:  CT Head 06/25/19 FINDINGS: Brain: Possible acute infarct in the right parietal lobe (series 5, image 12-13). No hemorrhage. No hydrocephalus. No extra-axial fluid collection. Chronic infarct in the right caudate head. Vascular:  No hyperdense vessel or unexpected calcification. Skull: Normal. Negative for fracture or focal lesion. Sinuses/Orbits: No middle ear or mastoid effusion. Paranasal sinuses are clear. Orbits are unremarkable. Other: None. ASPECTS [facility] Stroke Program Early CT Score): 9 IMPRESSION: Probable acute infarct in the right parietal lobe. No hemorrhage. Aspects is 9. Findings were communicated with Dr. Hyacinth Meeker on 03/08/23 at 4:35 PM via telephone. Electronically Signed   By: Lorenza Cambridge M.D.   On: 03/08/2023 16:35    Procedures Procedures  {Document cardiac monitor, telemetry assessment procedure when appropriate:1}  Medications Ordered in ED Medications  stroke: early stages of recovery book (has no administration in time range)    ED Course/ Medical Decision Making/ A&P   {   Click here for ABCD2, HEART and other calculatorsREFRESH Note before signing :1}                          Medical Decision Making Amount and/or Complexity of Data Reviewed Labs: ordered. Radiology: ordered.  Risk Decision regarding hospitalization.    This patient presents to the ED for concern of numbness, this involves an extensive number of treatment options, and is a complaint that carries with it a high risk of complications and morbidity.  The differential diagnosis includes acute stroke, anxiety, hypokalemia, MS   Co morbidities that complicate the patient evaluation  Prior stroke on Plavix   Additional history obtained:  Additional history obtained from electronic medical record External records from outside source obtained and reviewed including prior stroke workup, MRI of the brain in 2020 showed punctate acute infarct in the right thalamus   Lab Tests:  I Ordered, and personally interpreted labs.  The pertinent results include: BC which is overall unremarkable   Imaging Studies ordered:  I ordered imaging studies including CT scan of the brain as a code stroke I independently visualized  and interpreted imaging which showed possible acute infarct in the right parietal lobe, I discussed this with the radiologist and then again with the neurologist, the neurologist does not believe this to be acute and with her symptoms essentially resolved they have chosen not to give thrombolytics I agree with the radiologist interpretation   Cardiac Monitoring: / EKG:  The patient was maintained on a cardiac monitor.  I personally viewed and interpreted the cardiac monitored which showed an underlying rhythm of: NSR   Consultations Obtained:  I requested consultation with the neurologist,  and discussed lab and imaging findings as well as pertinent plan - they recommend: Admission to the hospital for stroke workup, no thrombolytic therapy, they will see the patient again in the morning   Problem List / ED Course / Critical interventions / Medication management  Likely had small acute stroke or TIA, symptoms have essentially resolved other than a very small amount of numbness to the great toe of the left foot, there is no weakness I have reviewed the patients home medicines and have made adjustments as needed   Social Determinants of Health:  Prior TIA   Test / Admission - Considered:  Will admit to hospital for further workup and evaluation   {Document critical care time when appropriate:1} {Document review of labs and clinical decision tools ie heart score, Chads2Vasc2 etc:1}  {Document your independent review of radiology images, and any outside records:1} {Document your discussion with family members, caretakers, and with consultants:1} {Document social determinants of health affecting pt's care:1} {Document your decision making why or why not admission, treatments were needed:1} Final Clinical Impression(s) / ED Diagnoses Final diagnoses:  TIA (transient ischemic attack)    Rx / DC Orders ED Discharge Orders     None

## 2023-03-08 NOTE — H&P (Incomplete)
History and Physical    Katelyn Smith:[id number] DOB: Dec 15, 1965 DOA: 03/08/2023  PCP: Elfredia Nevins, MD   Patient coming from: Home  I have personally briefly reviewed patient's old medical records in [facility]  Chief Complaint: Left sided numbness  HPI: Katelyn Smith is a 57 y.o. female with medical history significant for stroke, tobacco abuse, anxiety, coronary artery disease. Patient presented to the ED with complaints of numbness started in the toes of her left foot, extending upwards to involve her left upper extremity and left side of her lips.  Symptoms started about 2:00, resolved, and reoccurred at about 2:45 PM.  Denies weakness to her extremities, denies facial asymmetry, no slurred speech.  At time of my evaluation, symptoms have all resolved. Reports prior stroke to the right side with weakness and numbness, she received tPA with no residual deficits 2020. She reports compliance with her Plavix. She is completed a course of antibiotics for UTI, symptoms have resolved.  Has about 2-3 doses left.  She does not remember the name of the medication.  ED Course: Temperature 98.2.  Heart rate 60s to 80s.  Respiratory 16-19.  Blood pressure systolic 120s to 811B.  O2 sats greater than 95% on room air. Unremarkable BMP CBC.  Head CT - Probable acute infarct in the right parietal lobe.  Neurology was consulted, recommended MRI, CTA head and neck, admit for stroke workup.  Review of Systems: As per HPI all other systems reviewed and negative.  Past Medical History:  Diagnosis Date   Anxiety    Chest pain    a. 07/2016: Stress test showing no evidence of ischemia. b. cath 08/2016: 40% Ost RCA stenosis, 70% Lateral 3rd Mrg, and 80% Acute Mrg (too small for intervention, therefore medical therapy recommended).    Chronic back pain    Chronic neck pain    Depression    H/O degenerative disc disease    High cholesterol    Lumbar radiculopathy, right    Stroke (HCC)  10/2019   Tobacco abuse     Past Surgical History:  Procedure Laterality Date   BACK SURGERY     CARDIAC CATHETERIZATION N/A 09/09/2016   Procedure: Left Heart Cath and Coronary Angiography;  Surgeon: Peter M [place], MD;  Location: [facility] INVASIVE CV LAB;  Service: Cardiovascular;  Laterality: N/A;   CARDIAC CATHETERIZATION  09/09/2016   TUBAL LIGATION       reports that she has been smoking cigarettes. She has a 30.00 pack-year smoking history. She has never used smokeless tobacco. She reports that she does not drink alcohol and does not use drugs.  Allergies  Allergen Reactions   Mirabegron Hypertension    Family History  Problem Relation Age of Onset   Heart failure Mother    Heart disease Mother 79       first MI early 21s   Cancer Father        Died at 14   Cancer Sister    Stroke Other    Heart disease Other    Stroke Maternal Grandmother     Prior to Admission medications   Medication Sig Start Date End Date Taking? Authorizing Provider  atorvastatin (LIPITOR) 40 MG tablet Take 1 tablet (40 mg total) by mouth daily at 6 PM. 06/27/19   Metzger-Cihelka, Cristie Hem, NP  clopidogrel (PLAVIX) 75 MG tablet Take 1 tablet (75 mg total) by mouth daily. 06/27/19   Metzger-Cihelka, Desiree, NP  melatonin 5 MG TABS Take 1 tablet (  5 mg total) by mouth at bedtime. 08/30/21   Bobbye Morton, MD  methocarbamol (ROBAXIN) 500 MG tablet Take 1 tablet (500 mg total) by mouth every 8 (eight) hours as needed for muscle spasms. 08/30/21   Bobbye Morton, MD  mirtazapine (REMERON) 15 MG tablet Take 1 tablet (15 mg total) by mouth at bedtime. 08/30/21   Bobbye Morton, MD  naproxen (NAPROSYN) 500 MG tablet Take 1 tablet (500 mg total) by mouth 2 (two) times daily as needed (aching, pain, or discomfort). 08/30/21   Bobbye Morton, MD  nicotine (NICODERM CQ - DOSED IN MG/24 HOURS) 14 mg/24hr patch Place 1 patch (14 mg total) onto the skin daily. 08/31/21   Bobbye Morton, MD  traZODone (DESYREL) 50  MG tablet Take 1 tablet (50 mg total) by mouth at bedtime. 08/30/21   Bobbye Morton, MD  Vitamin D, Ergocalciferol, (DRISDOL) 1.25 MG (50000 UNIT) CAPS capsule Take 1 capsule (50,000 Units total) by mouth every 7 (seven) days. 09/04/21   Bobbye Morton, MD    Physical Exam: Vitals:   03/08/23 1632 03/08/23 1633 03/08/23 1645  BP: (!) 127/101  (!) 144/99  Pulse:  81 74  Resp:  17 19  SpO2:  97% 95%    Constitutional: NAD, calm, comfortable Vitals:   03/08/23 1632 03/08/23 1633 03/08/23 1645  BP: (!) 127/101  (!) 144/99  Pulse:  81 74  Resp:  17 19  SpO2:  97% 95%   Eyes: PERRL, lids and conjunctivae normal ENMT: Mucous membranes are moist.  Neck: normal, supple, no masses, no thyromegaly Respiratory: clear to auscultation bilaterally, no wheezing, no crackles. Normal respiratory effort. No accessory muscle use.  Cardiovascular: Regular rate and rhythm, no murmurs / rubs / gallops. No extremity edema.  Extremities warm. Abdomen: no tenderness, no masses palpated. No hepatosplenomegaly. Bowel sounds positive.  Musculoskeletal: no clubbing / cyanosis. No joint deformity upper and lower extremities.  Skin: no rashes, lesions, ulcers. No induration Neurologic: No apparent cranial nerve abnormality, moving extremities spontaneously Psychiatric: Normal judgment and insight. Alert and oriented x 3. Normal mood.   Labs on Admission: I have personally reviewed following labs and imaging studies  CBC: Recent Labs  Lab 03/08/23 1626 03/08/23 1639  WBC 6.4  --   NEUTROABS 3.8  --   HGB 13.8 13.6  HCT 40.8 40.0  MCV 99.8  --   PLT 218  --    Basic Metabolic Panel: Recent Labs  Lab 03/08/23 1626 03/08/23 1639  NA 137 141  K 4.7 4.0  CL 105 107  CO2 24  --   GLUCOSE 96 90  BUN 14 14  CREATININE 0.68 0.70  CALCIUM 8.9  --    GFR: CrCl cannot be calculated (Unknown ideal weight.). Liver Function Tests: Recent Labs  Lab 03/08/23 1626  AST 19  ALT 15  ALKPHOS 72   BILITOT 0.7  PROT 7.0  ALBUMIN 4.0   Coagulation Profile: Recent Labs  Lab 03/08/23 1626  INR 0.9   Radiological Exams on Admission: MR BRAIN WO CONTRAST  Result Date: 03/08/2023 CLINICAL DATA:  Stroke, follow up EXAM: MRI HEAD WITHOUT CONTRAST TECHNIQUE: Multiplanar, multiecho pulse sequences of the brain and surrounding structures were obtained without intravenous contrast. COMPARISON:  Same day CT head. FINDINGS: Brain: No acute infarction, hemorrhage, hydrocephalus, extra-axial collection or mass lesion. Moderate, age advanced T2/FLAIR hyperintensities in the white matter, nonspecific but compatible with chronic microvascular ischemic change. Remote infarct in the right  frontal white matter. Vascular: Major arterial flow voids are maintained at the skull base. Skull and upper cervical spine: Normal marrow signal. Sinuses/Orbits: Clear sinuses.  No acute orbital findings. Other: No mastoid effusions IMPRESSION: 1. No evidence of acute intracranial abnormality. 2. Moderate chronic microvascular ischemic disease. Electronically Signed   By: Feliberto Harts M.D.   On: 03/08/2023 17:45   CT ANGIO HEAD NECK W WO CM  Result Date: 03/08/2023 CLINICAL DATA:  Provided history: Stroke/TIA, determine embolic source. Left lower extremity weakness. EXAM: CT ANGIOGRAPHY HEAD AND NECK WITH AND WITHOUT CONTRAST TECHNIQUE: Multidetector CT imaging of the head and neck was performed using the standard protocol during bolus administration of intravenous contrast. Multiplanar CT image reconstructions and MIPs were obtained to evaluate the vascular anatomy. Carotid stenosis measurements (when applicable) are obtained utilizing NASCET criteria, using the distal internal carotid diameter as the denominator. RADIATION DOSE REDUCTION: This exam was performed according to the departmental dose-optimization program which includes automated exposure control, adjustment of the mA and/or kV according to patient size and/or  use of iterative reconstruction technique. CONTRAST:  75mL OMNIPAQUE IOHEXOL 350 MG/ML SOLN COMPARISON:  Same-day brain MRI 03/08/2023. Same day noncontrast head CT 03/08/2023. FINDINGS: CTA NECK FINDINGS Aortic arch: Atherosclerotic plaque within the visualized aortic arch and proximal major branch vessels of the neck. The innominate and left common carotid artery origins are excluded from the field of view. Within this limitation, there is no appreciable hemodynamically significant innominate or proximal subclavian artery stenosis. Right carotid system: CCA and ICA patent within the neck without stenosis. Mild atherosclerotic plaque scattered within the CCA and about the carotid bifurcation. Left carotid system: The origin of the common carotid artery is excluded from the field of view. Within this limitation, the common carotid and internal carotid arteries are patent within the neck without stenosis. Minimal atherosclerotic plaque within the proximal CCA and about the carotid bifurcation. Vertebral arteries: Vertebral arteries codominant and patent within the neck. Mild atherosclerotic plaque at the vertebral artery origins and within both vertebral arteries at the V3/V4 junction. Resultant mild stenosis of the right vertebral artery at the V3/V4 junction. Skeleton: Cervical spondylosis. No acute fracture or aggressive osseous lesion. Other neck: No cervical lymphadenopathy. 16 mm lipoma within the posterior right lower neck (for instance as seen on series 9, image 67) Upper chest: No consolidation within the imaged lung apices. Emphysema. Review of the MIP images confirms the above findings CTA HEAD FINDINGS Anterior circulation: The intracranial internal carotid arteries are patent. Atherosclerotic plaque within both vessels with no more than mild stenosis. The M1 middle cerebral arteries are patent. Sella early right MCA bifurcation. No M2 proximal branch occlusion or high-grade proximal stenosis. The  anterior cerebral arteries are patent. No intracranial aneurysm is identified. Posterior circulation: The intracranial vertebral arteries are patent. Atherosclerotic plaque within the bilateral vertebral arteries at the P3/P4 junction (with mild stenosis on the right). The basilar artery is patent. The posterior cerebral arteries are patent. Posterior communicating arteries are diminutive or absent, bilaterally. Venous sinuses: Within the limitations of contrast timing, no convincing thrombus. Anatomic variants: As described. Review of the MIP images confirms the above findings IMPRESSION: CTA neck: 1. The origin of the left common carotid artery is excluded from the field of view. Within this limitation, the common carotid and internal carotid arteries are patent within the neck without stenosis. Mild atherosclerotic plaque bilaterally, as described. 2. The vertebral arteries patent within the neck. Mild atherosclerotic plaque within bilaterally. Mild stenosis of  the right vertebral artery at the V3/V4 junction. 3. Aortic Atherosclerosis (ICD10-I70.0) and Emphysema (ICD10-J43.9). CTA head: 1. No intracranial large vessel occlusion or proximal high-grade arterial stenosis. 2. Intracranial atherosclerotic disease with sites of mild stenosis, as described. Electronically Signed   By: Jackey Loge D.O.   On: 03/08/2023 17:41   CT HEAD CODE STROKE WO CONTRAST  Result Date: 03/08/2023 CLINICAL DATA:  Code stroke.  Left lower extremity weakness EXAM: CT HEAD WITHOUT CONTRAST TECHNIQUE: Contiguous axial images were obtained from the base of the skull through the vertex without intravenous contrast. RADIATION DOSE REDUCTION: This exam was performed according to the departmental dose-optimization program which includes automated exposure control, adjustment of the mA and/or kV according to patient size and/or use of iterative reconstruction technique. COMPARISON:  CT Head 06/25/19 FINDINGS: Brain: Possible acute infarct  in the right parietal lobe (series 5, image 12-13). No hemorrhage. No hydrocephalus. No extra-axial fluid collection. Chronic infarct in the right caudate head. Vascular: No hyperdense vessel or unexpected calcification. Skull: Normal. Negative for fracture or focal lesion. Sinuses/Orbits: No middle ear or mastoid effusion. Paranasal sinuses are clear. Orbits are unremarkable. Other: None. ASPECTS [facility] Stroke Program Early CT Score): 9 IMPRESSION: Probable acute infarct in the right parietal lobe. No hemorrhage. Aspects is 9. Findings were communicated with Dr. Hyacinth Meeker on 03/08/23 at 4:35 PM via telephone. Electronically Signed   By: Lorenza Cambridge M.D.   On: 03/08/2023 16:35    EKG: Independently reviewed.  Sinus rhythm, rate 73, prolonged QT 508.  No significant change compared to prior.  Assessment/Plan Principal Problem:   Left sided numbness Active Problems:   Chronic pain syndrome   Tobacco abuse   CAD (coronary artery disease)   MDD (major depressive disorder), recurrent episode, severe (HCC)  Assessment and Plan: * Left sided numbness Presenting with left-sided numbness that has resolved.  No other focal neurologic deficits history of stroke 2020, received tPA, no residual deficits.  Head CT today - Probable acute infarct in the right parietal lobe.  Ongoing tobacco abuse. -Evaluated by teleneurologist, Dr. Eduardo Osier is not fully convinced about CT findings, subsequent MRI-no evidence of acute intracranial abnormality.  CTA head and neck-no intracranial large vessel occlusion, limited view of left common carotid artery, but otherwise patent carotid arteries without stenosis. -Continue home Plavix, add aspirin 81 mg DAPT - HgbA1c, Lipid panel - Continue home statin -Echocardiogram  CAD (coronary artery disease) Stable.  No chest pain.  EKG showing prolonged QTc.  Otherwise no significant change from prior. -Resume Plavix, start aspirin, continue statin  Tobacco abuse Ongoing  tobacco abuse. -Counseled to quit smoking. - Declined nicotine patch  Chronic pain syndrome Resume home oxycodone 15 mg every 8 hours as needed.    DVT prophylaxis: Lovenox Code Status: FULL Code Family Communication: Spouse at bedside. Disposition Plan: ~ 2 days Consults called: Neurology Admission status:  Obs tele     Author: Onnie Boer, MD 03/08/2023 12:35 AM  For on call review [email or web address].

## 2023-03-09 ENCOUNTER — Observation Stay (HOSPITAL_BASED_OUTPATIENT_CLINIC_OR_DEPARTMENT_OTHER): Payer: 59

## 2023-03-09 ENCOUNTER — Other Ambulatory Visit (HOSPITAL_COMMUNITY): Payer: Self-pay | Admitting: *Deleted

## 2023-03-09 DIAGNOSIS — G459 Transient cerebral ischemic attack, unspecified: Secondary | ICD-10-CM | POA: Diagnosis not present

## 2023-03-09 DIAGNOSIS — R200 Anesthesia of skin: Secondary | ICD-10-CM | POA: Diagnosis not present

## 2023-03-09 LAB — ECHOCARDIOGRAM COMPLETE
Area-P 1/2: 2.99 cm2
Height: 63 in
MV M vel: 1.11 m/s
MV Peak grad: 4.9 mmHg
S' Lateral: 2.8 cm
Weight: 1836.8 oz

## 2023-03-09 LAB — LIPID PANEL
Cholesterol: 147 mg/dL (ref 0–200)
HDL: 52 mg/dL (ref >40)
LDL Cholesterol: 78 mg/dL (ref 0–99)
Total CHOL/HDL Ratio: 2.8 RATIO
Triglycerides: 85 mg/dL (ref <150)
VLDL: 17 mg/dL (ref 0–40)

## 2023-03-09 LAB — HIV ANTIBODY (ROUTINE TESTING W REFLEX): HIV Screen 4th Generation wRfx: NONREACTIVE

## 2023-03-09 MED ORDER — POLYETHYLENE GLYCOL 3350 17 G PO PACK: 17.0000 g | PACK | Freq: Every day | ORAL | Status: DC | PRN

## 2023-03-09 MED ORDER — ACETAMINOPHEN 325 MG PO TABS: 650.0000 mg | ORAL_TABLET | Freq: Four times a day (QID) | ORAL | Status: DC | PRN

## 2023-03-09 MED ORDER — ASPIRIN 81 MG PO TBEC: 81.0000 mg | DELAYED_RELEASE_TABLET | Freq: Every day | ORAL | 12 refills | Status: AC

## 2023-03-09 MED ORDER — ACETAMINOPHEN 650 MG RE SUPP: 650.0000 mg | Freq: Four times a day (QID) | RECTAL | Status: DC | PRN

## 2023-03-09 MED ORDER — ENOXAPARIN SODIUM 40 MG/0.4ML IJ SOSY
40.0000 mg | PREFILLED_SYRINGE | INTRAMUSCULAR | Status: DC
  Filled 2023-03-09: qty 0.4

## 2023-03-09 NOTE — Progress Notes (Signed)
  Echocardiogram 2D Echocardiogram has been performed.  Katelyn Smith 03/09/2023, 9:11 AM

## 2023-03-09 NOTE — Evaluation (Signed)
Physical Therapy Evaluation Patient Details Name: KEALANI BEAUDREAU MRN: [id number] DOB: 08-05-1966 Today's Date: 03/09/2023  History of Present Illness  Vinisha L Kerper is a 57 y.o. female with medical history significant for stroke, tobacco abuse, anxiety, coronary artery disease.  Patient presented to the ED with complaints of numbness started in the toes of her left foot, extending upwards to involve her left upper extremity and left side of her lips.  Symptoms started about 2:00, resolved, and reoccurred at about 2:45 PM.  Denies weakness to her extremities, denies facial asymmetry, no slurred speech.  At time of my evaluation, symptoms have all resolved.  Reports prior stroke to the right side with weakness and numbness, she received tPA with no residual deficits 2020.   Clinical Impression  Patient functioning at baseline for functional mobility and gait demonstrating good return for ambulating in room, hallway, stairs without loss of balance.  Plan:  Patient discharged from physical therapy to care of nursing for ambulation daily as tolerated for length of stay.          Assistance Recommended at Discharge None  If plan is discharge home, recommend the following:  Can travel by private vehicle           Equipment Recommendations None recommended by PT  Recommendations for Other Services       Functional Status Assessment Patient has not had a recent decline in their functional status     Precautions / Restrictions Precautions Precautions: None Restrictions Weight Bearing Restrictions: No      Mobility  Bed Mobility Overal bed mobility: Independent                  Transfers Overall transfer level: Independent                      Ambulation/Gait Ambulation/Gait assistance: Modified independent (Device/Increase time), Independent Gait Distance (Feet): 150 Feet Assistive device: None Gait Pattern/deviations: WFL(Within Functional Limits) Gait  velocity: near normal     General Gait Details: grossly WFL with good return for ambulating in room, hallways, stairs without loss of balance  Stairs Stairs: Yes   Stair Management: One rail Right, One rail Left, Alternating pattern Number of Stairs: 5 General stair comments: demonstrates good return for going up/down stairs using 1 side rail without loss of balance  Wheelchair Mobility     Tilt Bed    Modified Rankin (Stroke Patients Only)       Balance Overall balance assessment: Independent                                           Pertinent Vitals/Pain Pain Assessment Pain Assessment: No/denies pain    Home Living Family/patient expects to be discharged to:: Private residence Living Arrangements: Spouse/significant other Available Help at Discharge: Family;Available PRN/intermittently Type of Home: Apartment Home Access: Stairs to enter Entrance Stairs-Rails: Can reach both;Right;Left Entrance Stairs-Number of Steps: 5   Home Layout: One level Home Equipment: None      Prior Function Prior Level of Function : Independent/Modified Independent;Driving             Mobility Comments: Community ambulator without AD, drives, shops ADLs Comments: Independent     Hand Dominance   Dominant Hand: Right    Extremity/Trunk Assessment   Upper Extremity Assessment Upper Extremity Assessment: Defer to OT evaluation  Lower Extremity Assessment Lower Extremity Assessment: Overall WFL for tasks assessed    Cervical / Trunk Assessment Cervical / Trunk Assessment: Normal  Communication   Communication: No difficulties  Cognition Arousal/Alertness: Awake/alert Behavior During Therapy: WFL for tasks assessed/performed Overall Cognitive Status: Within Functional Limits for tasks assessed                                          General Comments      Exercises     Assessment/Plan    PT Assessment Patient does not  need any further PT services  PT Problem List         PT Treatment Interventions      PT Goals (Current goals can be found in the Care Plan section)  Acute Rehab PT Goals Patient Stated Goal: return home PT Goal Formulation: With patient Time For Goal Achievement: 03/09/23 Potential to Achieve Goals: Good    Frequency       Co-evaluation PT/OT/SLP Co-Evaluation/Treatment: Yes Reason for Co-Treatment: To address functional/ADL transfers PT goals addressed during session: Mobility/safety with mobility;Balance OT goals addressed during session: ADL's and self-care       AM-PAC PT "6 Clicks" Mobility  Outcome Measure Help needed turning from your back to your side while in a flat bed without using bedrails?: None Help needed moving from lying on your back to sitting on the side of a flat bed without using bedrails?: None Help needed moving to and from a bed to a chair (including a wheelchair)?: None Help needed standing up from a chair using your arms (e.g., wheelchair or bedside chair)?: None Help needed to walk in hospital room?: None Help needed climbing 3-5 steps with a railing? : None 6 Click Score: 24    End of Session   Activity Tolerance: Patient tolerated treatment well Patient left: in chair Nurse Communication: Mobility status PT Visit Diagnosis: Unsteadiness on feet (R26.81);Other abnormalities of gait and mobility (R26.89);Muscle weakness (generalized) (M62.81)    Time: 1610-9604 PT Time Calculation (min) (ACUTE ONLY): 16 min   Charges:   PT Evaluation $PT Eval Low Complexity: 1 Low PT Treatments $Therapeutic Activity: 8-22 mins PT General Charges $$ ACUTE PT VISIT: 1 Visit         12:09 PM, 03/09/23 Ocie Bob, MPT Physical Therapist with [facility] [phone] office [phone] mobile phone

## 2023-03-09 NOTE — Evaluation (Signed)
Occupational Therapy Evaluation Patient Details Name: Katelyn Smith MRN: [id number] DOB: [birth year] Today's Date: 03/09/2023   History of Present Illness Katelyn Smith is a 57 y.o. female with medical history significant for stroke, tobacco abuse, anxiety, coronary artery disease.  Patient presented to the ED with complaints of numbness started in the toes of her left foot, extending upwards to involve her left upper extremity and left side of her lips.  Symptoms started about 2:00, resolved, and reoccurred at about 2:45 PM.  Denies weakness to her extremities, denies facial asymmetry, no slurred speech.  At time of my evaluation, symptoms have all resolved.  Reports prior stroke to the right side with weakness and numbness, she received tPA with no residual deficits 2020. (per MD)   Clinical Impression   Pt agreeable to OT and PT co-evaluation. Pt appears to be at or near baseline level of function. Pt demonstrated independent bed and out of bed mobility without AD. Generally weak with WFL A/ROM. Pt is not recommended for further acute OT services and will be discharged to care of nursing staff for remaining length of stay.      Recommendations for follow up therapy are one component of a multi-disciplinary discharge planning process, led by the attending physician.  Recommendations may be updated based on patient status, additional functional criteria and insurance authorization.   Assistance Recommended at Discharge None        Functional Status Assessment  Patient has not had a recent decline in their functional status  Equipment Recommendations  None recommended by OT           Precautions / Restrictions Precautions Precautions: Fall Restrictions Weight Bearing Restrictions: No      Mobility Bed Mobility Overal bed mobility: Independent                  Transfers Overall transfer level: Independent                        Balance Overall balance  assessment: Independent                                         ADL either performed or assessed with clinical judgement   ADL Overall ADL's : Independent                                       General ADL Comments: Able to doff and don sock; indepndent mobility.     Vision Baseline Vision/History: 1 Wears glasses Ability to See in Adequate Light: 0 Adequate Patient Visual Report: No change from baseline Vision Assessment?: No apparent visual deficits                Pertinent Vitals/Pain Pain Assessment Pain Assessment: No/denies pain     Hand Dominance Right   Extremity/Trunk Assessment Upper Extremity Assessment Upper Extremity Assessment: Overall WFL for tasks assessed;Generalized weakness   Lower Extremity Assessment Lower Extremity Assessment: Defer to PT evaluation   Cervical / Trunk Assessment Cervical / Trunk Assessment: Normal   Communication Communication Communication: No difficulties   Cognition Arousal/Alertness: Awake/alert Behavior During Therapy: WFL for tasks assessed/performed Overall Cognitive Status: Within Functional Limits for tasks assessed  Home Living Family/patient expects to be discharged to:: Private residence Living Arrangements: Spouse/significant other Available Help at Discharge: Family;Available PRN/intermittently Type of Home: Apartment Home Access: Stairs to enter Entrance Stairs-Number of Steps: 5 Entrance Stairs-Rails: Can reach both;Right;Left Home Layout: One level     Bathroom Shower/Tub: Chief Strategy Officer: Standard Bathroom Accessibility: Yes How Accessible: Accessible via walker Home Equipment: None          Prior Functioning/Environment Prior Level of Function : Independent/Modified Independent                                                Co-evaluation  PT/OT/SLP Co-Evaluation/Treatment: Yes Reason for Co-Treatment: To address functional/ADL transfers   OT goals addressed during session: ADL's and self-care      AM-PAC OT "6 Clicks" Daily Activity     Outcome Measure Help from another person eating meals?: None Help from another person taking care of personal grooming?: None Help from another person toileting, which includes using toliet, bedpan, or urinal?: None Help from another person bathing (including washing, rinsing, drying)?: None Help from another person to put on and taking off regular upper body clothing?: None Help from another person to put on and taking off regular lower body clothing?: None 6 Click Score: 24   End of Session    Activity Tolerance: Patient tolerated treatment well Patient left: in bed;with call bell/phone within reach  OT Visit Diagnosis: Other symptoms and signs involving the nervous system (W09.811)                Time: 0812-0821 OT Time Calculation (min): 9 min Charges:  OT General Charges $OT Visit: 1 Visit OT Evaluation $OT Eval Low Complexity: 1 Low  Lyman Balingit OT, MOT  [employer] 03/09/2023, 9:09 AM

## 2023-03-09 NOTE — Assessment & Plan Note (Signed)
Ongoing tobacco abuse. -Counseled to quit smoking. - Declined nicotine patch

## 2023-03-09 NOTE — Progress Notes (Signed)
Pt refused Lovenox injection this AM. MD made aware via secure chat. Will continue to monitor.

## 2023-03-09 NOTE — Progress Notes (Signed)
Ng Discharge Note  Admit Date:  03/08/2023 Discharge date: 03/09/2023   Katelyn Smith to be D/C'd Home per MD order.  AVS completed. Patient/caregiver able to verbalize understanding.  Discharge Medication: Allergies as of 03/09/2023       Reactions   Mirabegron Hypertension        Medication List     TAKE these medications    aspirin EC 81 MG tablet Take 1 tablet (81 mg total) by mouth daily. Swallow whole. Start taking on: March 10, 2023   atorvastatin 40 MG tablet Commonly known as: LIPITOR Take 1 tablet (40 mg total) by mouth daily at 6 PM.   clopidogrel 75 MG tablet Commonly known as: PLAVIX Take 1 tablet (75 mg total) by mouth daily.   melatonin 5 MG Tabs Take 1 tablet (5 mg total) by mouth at bedtime.   methocarbamol 500 MG tablet Commonly known as: ROBAXIN Take 1 tablet (500 mg total) by mouth every 8 (eight) hours as needed for muscle spasms.   mirtazapine 15 MG tablet Commonly known as: REMERON Take 1 tablet (15 mg total) by mouth at bedtime.   naproxen 500 MG tablet Commonly known as: NAPROSYN Take 1 tablet (500 mg total) by mouth 2 (two) times daily as needed (aching, pain, or discomfort).   nicotine 14 mg/24hr patch Commonly known as: NICODERM CQ - dosed in mg/24 hours Place 1 patch (14 mg total) onto the skin daily.   traZODone 50 MG tablet Commonly known as: DESYREL Take 1 tablet (50 mg total) by mouth at bedtime.   Vitamin D (Ergocalciferol) 1.25 MG (50000 UNIT) Caps capsule Commonly known as: DRISDOL Take 1 capsule (50,000 Units total) by mouth every 7 (seven) days.        Discharge Assessment: Vitals:   03/09/23 0406 03/09/23 0750  BP: 102/72 121/87  Pulse: 73 68  Resp: 18 18  Temp: 97.7 F (36.5 C) 98 F (36.7 C)  SpO2: 98% 98%   Skin clean, dry and intact without evidence of skin break down, no evidence of skin tears noted. IV catheter discontinued intact. Site without signs and symptoms of complications - no redness or edema  noted at insertion site, patient denies c/o pain - only slight tenderness at site.  Dressing with slight pressure applied.  D/c Instructions-Education: Discharge instructions given to patient/family with verbalized understanding. D/c education completed with patient/family including follow up instructions, medication list, d/c activities limitations if indicated, with other d/c instructions as indicated by MD - patient able to verbalize understanding, all questions fully answered. Patient instructed to return to ED, call 911, or call MD for any changes in condition.  Patient escorted via WC, and D/C home via private auto.  Cristal Ford, LPN 09/05/1094 04:54 AM

## 2023-03-09 NOTE — TOC CM/SW Note (Signed)
Transition of Care [facility]) - Inpatient Brief Assessment   Patient Details  Name: GOLIE SIMONI MRN: [id number] Date of Birth: Jul 07, 1966  Transition of Care [facility]) CM/SW Contact:    Villa Herb, LCSWA Phone Number: 03/09/2023, 9:47 AM   Clinical Narrative: Transition of Care Department [facility]) has reviewed patient and no TOC needs have been identified at this time. We will continue to monitor patient advancement through interdisciplinary progression rounds. If new patient transition needs arise, please place a TOC consult.  Transition of Care Asessment: Insurance and Status: Insurance coverage has been reviewed Patient has primary care physician: Yes Home environment has been reviewed: from home Prior level of function:: independent Prior/Current Home Services: No current home services Social Determinants of Health Reivew: SDOH reviewed no interventions necessary Readmission risk has been reviewed: Yes Transition of care needs: no transition of care needs at this time

## 2023-03-09 NOTE — Assessment & Plan Note (Signed)
Stable.  No chest pain.  EKG showing prolonged QTc.  Otherwise no significant change from prior. -Resume Plavix, start aspirin, continue statin

## 2023-03-09 NOTE — Discharge Summary (Signed)
Physician Discharge Summary  Katelyn Smith:[id number] DOB: 1966/06/15 DOA: 03/08/2023  PCP: Elfredia Nevins, MD  Admit date: 03/08/2023 Discharge date: 03/09/2023  Admitted From: home Disposition:  home  Recommendations for Outpatient Follow-up:  Follow up with PCP in 1-2 weeks Ambulatory referral to neurology made  Home Health: none Equipment/Devices: none  Discharge Condition: stable CODE STATUS: Full code Diet Orders (From admission, onward)     Start     Ordered   03/08/23 1957  Diet regular Fluid consistency: Thin  Diet effective now       Question:  Fluid consistency:  Answer:  Thin   03/08/23 1956            HPI: Per admitting MD, Katelyn L Smith is a 57 y.o. female with medical history significant for stroke, tobacco abuse, anxiety, coronary artery disease. Patient presented to the ED with complaints of numbness started in the toes of her left foot, extending upwards to involve her left upper extremity and left side of her lips.  Symptoms started about 2:00, resolved, and reoccurred at about 2:45 PM.  Denies weakness to her extremities, denies facial asymmetry, no slurred speech.  At time of my evaluation, symptoms have all resolved. Reports prior stroke to the right side with weakness and numbness, she received tPA with no residual deficits 2020. She reports compliance with her Plavix. She is completed a course of antibiotics for UTI, symptoms have resolved.  Has about 2-3 doses left.  She does not remember the name of the medication.  Hospital Course / Discharge diagnoses: Principal Problem:   Left sided numbness Active Problems:   Chronic pain syndrome   Tobacco abuse   CAD (coronary artery disease)   MDD (major depressive disorder), recurrent episode, severe (HCC)   Principal problem TIA, left-sided numbness -patient was admitted to the hospital with transient left-sided numbness and teleneurology was consulted.  Initial head CT raise concern for about an  acute infarct in the right parietal lobe, however a follow-up MRI did not show any evidence of acute intracranial abnormalities.  CTA head and neck was without any large vessel occlusions.  Lipid panel showed an LDL of 78, continue home statin.  Neurology recommends to add aspirin for dual antiplatelet therapy.  Worked well with PT without need for follow-up.  A 2D echo was done which showed normal LVEF, no WMA, normal diastolic parameters.  RV was normal.  Given resolution of her symptoms, she is ambulatory and otherwise back to baseline, will be discharged home in stable condition.  Ambulatory referral to neurology has been made given her prior history of stroke  Active problems Tobacco use-counseled for cessation Hyperlipidemia-continue statin Chronic pain-resume home medications CAD-continue home medications, no chest pain  Sepsis ruled out   Discharge Instructions  Discharge Instructions     Ambulatory referral to Neurology   Complete by: As directed    An appointment is requested in approximately: 4 weeks      Allergies as of 03/09/2023       Reactions   Mirabegron Hypertension        Medication List     TAKE these medications    aspirin EC 81 MG tablet Take 1 tablet (81 mg total) by mouth daily. Swallow whole. Start taking on: March 10, 2023   atorvastatin 40 MG tablet Commonly known as: LIPITOR Take 1 tablet (40 mg total) by mouth daily at 6 PM.   clopidogrel 75 MG tablet Commonly known as: PLAVIX Take 1  tablet (75 mg total) by mouth daily.   melatonin 5 MG Tabs Take 1 tablet (5 mg total) by mouth at bedtime.   methocarbamol 500 MG tablet Commonly known as: ROBAXIN Take 1 tablet (500 mg total) by mouth every 8 (eight) hours as needed for muscle spasms.   mirtazapine 15 MG tablet Commonly known as: REMERON Take 1 tablet (15 mg total) by mouth at bedtime.   naproxen 500 MG tablet Commonly known as: NAPROSYN Take 1 tablet (500 mg total) by mouth 2 (two)  times daily as needed (aching, pain, or discomfort).   nicotine 14 mg/24hr patch Commonly known as: NICODERM CQ - dosed in mg/24 hours Place 1 patch (14 mg total) onto the skin daily.   traZODone 50 MG tablet Commonly known as: DESYREL Take 1 tablet (50 mg total) by mouth at bedtime.   Vitamin D (Ergocalciferol) 1.25 MG (50000 UNIT) Caps capsule Commonly known as: DRISDOL Take 1 capsule (50,000 Units total) by mouth every 7 (seven) days.        Follow-up Information     Elfredia Nevins, MD Follow up in 1 week(s).   Specialty: Internal Medicine Contact information: [address] [place] [address] [phone]                 Consultations: Teleneurology   Procedures/Studies:  ECHOCARDIOGRAM COMPLETE  Result Date: 03/09/2023    ECHOCARDIOGRAM REPORT   Patient Name:   Katelyn Smith Date of Exam: 03/09/2023 Medical Rec #:  [id number]      Height:       63.0 in Accession #:    [id number]     Weight:       114.8 lb Date of Birth:  Nov 05, 1965      BSA:          1.527 m Patient Age:    56 years       BP:           121/87 mmHg Patient Gender: F              HR:           64 bpm. [facility] Procedure: 2D Echo, Cardiac Doppler and Color Doppler Indications:    Stroke I63.9  History:        Patient has prior history of Echocardiogram examinations, most                 recent 06/26/2019. CAD, Stroke; Risk Factors:Current Smoker and                 Dyslipidemia.  Sonographer:    Aron Baba Referring Phys: [id number] Heloise Beecham [facility]  Sonographer Comments: Suboptimal parasternal window and no apical window. Image acquisition challenging due to respiratory motion. IMPRESSIONS  1. Left ventricular ejection fraction, by estimation, is 60 to 65%. The left ventricle has normal function. The left ventricle has no regional wall motion abnormalities. Left ventricular diastolic parameters were normal.  2. Right ventricular systolic function is normal. The right ventricular  size is normal. There is normal pulmonary artery systolic pressure.  3. The mitral valve is abnormal. Trivial mitral valve regurgitation. No evidence of mitral stenosis.  4. The aortic valve is tricuspid. There is mild calcification of the aortic valve. Aortic valve regurgitation is not visualized. Aortic valve sclerosis is present, with no evidence of aortic valve stenosis.  5. The inferior vena cava is dilated in size with >50% respiratory variability, suggesting right atrial pressure of 8  mmHg. FINDINGS  Left Ventricle: Left ventricular ejection fraction, by estimation, is 60 to 65%. The left ventricle has normal function. The left ventricle has no regional wall motion abnormalities. The left ventricular internal cavity size was normal in size. There is  no left ventricular hypertrophy. Left ventricular diastolic parameters were normal. Right Ventricle: The right ventricular size is normal. No increase in right ventricular wall thickness. Right ventricular systolic function is normal. There is normal pulmonary artery systolic pressure. The tricuspid regurgitant velocity is 1.86 m/s, and  with an assumed right atrial pressure of 8 mmHg, the estimated right ventricular systolic pressure is 21.8 mmHg. Left Atrium: Left atrial size was normal in size. Right Atrium: Right atrial size was normal in size. Pericardium: There is no evidence of pericardial effusion. Mitral Valve: The mitral valve is abnormal. There is mild thickening of the mitral valve leaflet(s). Trivial mitral valve regurgitation. No evidence of mitral valve stenosis. Tricuspid Valve: The tricuspid valve is normal in structure. Tricuspid valve regurgitation is trivial. No evidence of tricuspid stenosis. Aortic Valve: The aortic valve is tricuspid. There is mild calcification of the aortic valve. Aortic valve regurgitation is not visualized. Aortic valve sclerosis is present, with no evidence of aortic valve stenosis. Pulmonic Valve: The pulmonic valve  was normal in structure. Pulmonic valve regurgitation is not visualized. No evidence of pulmonic stenosis. Aorta: The aortic root is normal in size and structure. Venous: The inferior vena cava is dilated in size with greater than 50% respiratory variability, suggesting right atrial pressure of 8 mmHg. IAS/Shunts: The interatrial septum appears to be lipomatous. No atrial level shunt detected by color flow Doppler.  LEFT VENTRICLE PLAX 2D LVIDd:         4.00 cm   Diastology LVIDs:         2.80 cm   LV e' medial:    6.95 cm/s LV PW:         0.70 cm   LV E/e' medial:  12.0 LV IVS:        0.70 cm   LV e' lateral:   7.89 cm/s LVOT diam:     1.50 cm   LV E/e' lateral: 10.6 LV SV:         29 LV SV Index:   19 LVOT Area:     1.77 cm  RIGHT VENTRICLE RV S prime:     11.00 cm/s TAPSE (M-mode): 1.5 cm LEFT ATRIUM           Index        RIGHT ATRIUM          Index LA diam:      2.10 cm 1.38 cm/m   RA Area:     9.11 cm LA Vol (A2C): 12.9 ml 8.45 ml/m   RA Volume:   16.20 ml 10.61 ml/m LA Vol (A4C): 17.6 ml 11.52 ml/m  AORTIC VALVE LVOT Vmax:   70.50 cm/s LVOT Vmean:  46.367 cm/s LVOT VTI:    0.164 m  AORTA Ao Root diam: 3.10 cm Ao Asc diam:  2.90 cm MITRAL VALVE               TRICUSPID VALVE MV Area (PHT): 2.99 cm    TR Peak grad:   13.8 mmHg MV Decel Time: 254 msec    TR Vmax:        186.00 cm/s MR Peak grad: 4.9 mmHg MR Vmax:      111.00 cm/s  SHUNTS MV E velocity: 83.60 cm/s  Systemic VTI:  0.16 m MV A velocity: 72.50 cm/s  Systemic Diam: 1.50 cm MV E/A ratio:  1.15 Charlton Haws MD Electronically signed by Charlton Haws MD Signature Date/Time: 03/09/2023/10:23:49 AM    Final    MR BRAIN WO CONTRAST  Result Date: 03/08/2023 CLINICAL DATA:  Stroke, follow up EXAM: MRI HEAD WITHOUT CONTRAST TECHNIQUE: Multiplanar, multiecho pulse sequences of the brain and surrounding structures were obtained without intravenous contrast. COMPARISON:  Same day CT head. FINDINGS: Brain: No acute infarction, hemorrhage, hydrocephalus,  extra-axial collection or mass lesion. Moderate, age advanced T2/FLAIR hyperintensities in the white matter, nonspecific but compatible with chronic microvascular ischemic change. Remote infarct in the right frontal white matter. Vascular: Major arterial flow voids are maintained at the skull base. Skull and upper cervical spine: Normal marrow signal. Sinuses/Orbits: Clear sinuses.  No acute orbital findings. Other: No mastoid effusions IMPRESSION: 1. No evidence of acute intracranial abnormality. 2. Moderate chronic microvascular ischemic disease. Electronically Signed   By: Feliberto Harts M.D.   On: 03/08/2023 17:45   CT ANGIO HEAD NECK W WO CM  Result Date: 03/08/2023 CLINICAL DATA:  Provided history: Stroke/TIA, determine embolic source. Left lower extremity weakness. EXAM: CT ANGIOGRAPHY HEAD AND NECK WITH AND WITHOUT CONTRAST TECHNIQUE: Multidetector CT imaging of the head and neck was performed using the standard protocol during bolus administration of intravenous contrast. Multiplanar CT image reconstructions and MIPs were obtained to evaluate the vascular anatomy. Carotid stenosis measurements (when applicable) are obtained utilizing NASCET criteria, using the distal internal carotid diameter as the denominator. RADIATION DOSE REDUCTION: This exam was performed according to the departmental dose-optimization program which includes automated exposure control, adjustment of the mA and/or kV according to patient size and/or use of iterative reconstruction technique. CONTRAST:  75mL OMNIPAQUE IOHEXOL 350 MG/ML SOLN COMPARISON:  Same-day brain MRI 03/08/2023. Same day noncontrast head CT 03/08/2023. FINDINGS: CTA NECK FINDINGS Aortic arch: Atherosclerotic plaque within the visualized aortic arch and proximal major branch vessels of the neck. The innominate and left common carotid artery origins are excluded from the field of view. Within this limitation, there is no appreciable hemodynamically significant  innominate or proximal subclavian artery stenosis. Right carotid system: CCA and ICA patent within the neck without stenosis. Mild atherosclerotic plaque scattered within the CCA and about the carotid bifurcation. Left carotid system: The origin of the common carotid artery is excluded from the field of view. Within this limitation, the common carotid and internal carotid arteries are patent within the neck without stenosis. Minimal atherosclerotic plaque within the proximal CCA and about the carotid bifurcation. Vertebral arteries: Vertebral arteries codominant and patent within the neck. Mild atherosclerotic plaque at the vertebral artery origins and within both vertebral arteries at the V3/V4 junction. Resultant mild stenosis of the right vertebral artery at the V3/V4 junction. Skeleton: Cervical spondylosis. No acute fracture or aggressive osseous lesion. Other neck: No cervical lymphadenopathy. 16 mm lipoma within the posterior right lower neck (for instance as seen on series 9, image 67) Upper chest: No consolidation within the imaged lung apices. Emphysema. Review of the MIP images confirms the above findings CTA HEAD FINDINGS Anterior circulation: The intracranial internal carotid arteries are patent. Atherosclerotic plaque within both vessels with no more than mild stenosis. The M1 middle cerebral arteries are patent. Sella early right MCA bifurcation. No M2 proximal branch occlusion or high-grade proximal stenosis. The anterior cerebral arteries are patent. No intracranial aneurysm is identified. Posterior circulation: The intracranial vertebral arteries are patent. Atherosclerotic plaque within  the bilateral vertebral arteries at the P3/P4 junction (with mild stenosis on the right). The basilar artery is patent. The posterior cerebral arteries are patent. Posterior communicating arteries are diminutive or absent, bilaterally. Venous sinuses: Within the limitations of contrast timing, no convincing  thrombus. Anatomic variants: As described. Review of the MIP images confirms the above findings IMPRESSION: CTA neck: 1. The origin of the left common carotid artery is excluded from the field of view. Within this limitation, the common carotid and internal carotid arteries are patent within the neck without stenosis. Mild atherosclerotic plaque bilaterally, as described. 2. The vertebral arteries patent within the neck. Mild atherosclerotic plaque within bilaterally. Mild stenosis of the right vertebral artery at the V3/V4 junction. 3. Aortic Atherosclerosis (ICD10-I70.0) and Emphysema (ICD10-J43.9). CTA head: 1. No intracranial large vessel occlusion or proximal high-grade arterial stenosis. 2. Intracranial atherosclerotic disease with sites of mild stenosis, as described. Electronically Signed   By: Jackey Loge D.O.   On: 03/08/2023 17:41   CT HEAD CODE STROKE WO CONTRAST  Result Date: 03/08/2023 CLINICAL DATA:  Code stroke.  Left lower extremity weakness EXAM: CT HEAD WITHOUT CONTRAST TECHNIQUE: Contiguous axial images were obtained from the base of the skull through the vertex without intravenous contrast. RADIATION DOSE REDUCTION: This exam was performed according to the departmental dose-optimization program which includes automated exposure control, adjustment of the mA and/or kV according to patient size and/or use of iterative reconstruction technique. COMPARISON:  CT Head 06/25/19 FINDINGS: Brain: Possible acute infarct in the right parietal lobe (series 5, image 12-13). No hemorrhage. No hydrocephalus. No extra-axial fluid collection. Chronic infarct in the right caudate head. Vascular: No hyperdense vessel or unexpected calcification. Skull: Normal. Negative for fracture or focal lesion. Sinuses/Orbits: No middle ear or mastoid effusion. Paranasal sinuses are clear. Orbits are unremarkable. Other: None. ASPECTS [facility] Stroke Program Early CT Score): 9 IMPRESSION: Probable acute infarct in the  right parietal lobe. No hemorrhage. Aspects is 9. Findings were communicated with Dr. Hyacinth Meeker on 03/08/23 at 4:35 PM via telephone. Electronically Signed   By: Lorenza Cambridge M.D.   On: 03/08/2023 16:35     Subjective: - no chest pain, shortness of breath, no abdominal pain, nausea or vomiting.   Discharge Exam: BP 121/87 (BP Location: Right Arm)   Pulse 68   Temp 98 F (36.7 C) (Oral)   Resp 18   Ht 5\' 3"  (1.6 m)   Wt 52.1 kg   SpO2 98%   BMI 20.34 kg/m   General: Pt is alert, awake, not in acute distress Cardiovascular: RRR, S1/S2 +, no rubs, no gallops Respiratory: CTA bilaterally, no wheezing, no rhonchi Abdominal: Soft, NT, ND, bowel sounds + Extremities: no edema, no cyanosis   The results of significant diagnostics from this hospitalization (including imaging, microbiology, ancillary and laboratory) are listed below for reference.     Microbiology: No results found for this or any previous visit (from the past 240 hour(s)).   Labs: Basic Metabolic Panel: Recent Labs  Lab 03/08/23 1626 03/08/23 1639  NA 137 141  K 4.7 4.0  CL 105 107  CO2 24  --   GLUCOSE 96 90  BUN 14 14  CREATININE 0.68 0.70  CALCIUM 8.9  --    Liver Function Tests: Recent Labs  Lab 03/08/23 1626  AST 19  ALT 15  ALKPHOS 72  BILITOT 0.7  PROT 7.0  ALBUMIN 4.0   CBC: Recent Labs  Lab 03/08/23 1626 03/08/23 1639  WBC 6.4  --  NEUTROABS 3.8  --   HGB 13.8 13.6  HCT 40.8 40.0  MCV 99.8  --   PLT 218  --    CBG: No results for input(s): "GLUCAP" in the last 168 hours. Hgb A1c No results for input(s): "HGBA1C" in the last 72 hours. Lipid Profile Recent Labs    03/09/23 0401  CHOL 147  HDL 52  LDLCALC 78  TRIG 85  CHOLHDL 2.8   Thyroid function studies No results for input(s): "TSH", "T4TOTAL", "T3FREE", "THYROIDAB" in the last 72 hours.  Invalid input(s): "FREET3" Urinalysis    Component Value Date/Time   COLORURINE YELLOW 08/25/2021 2300   APPEARANCEUR CLEAR  08/25/2021 2300   LABSPEC 1.010 08/25/2021 2300   PHURINE 6.0 08/25/2021 2300   GLUCOSEU NEGATIVE 08/25/2021 2300   HGBUR TRACE (A) 08/25/2021 2300   BILIRUBINUR NEGATIVE 08/25/2021 2300   KETONESUR NEGATIVE 08/25/2021 2300   PROTEINUR NEGATIVE 08/25/2021 2300   UROBILINOGEN 0.2 03/24/2010 2315   NITRITE NEGATIVE 08/25/2021 2300   LEUKOCYTESUR NEGATIVE 08/25/2021 2300    FURTHER DISCHARGE INSTRUCTIONS:   Get Medicines reviewed and adjusted: Please take all your medications with you for your next visit with your Primary MD   Laboratory/radiological data: Please request your Primary MD to go over all hospital tests and procedure/radiological results at the follow up, please ask your Primary MD to get all Hospital records sent to his/her office.   In some cases, they will be blood work, cultures and biopsy results pending at the time of your discharge. Please request that your primary care M.D. goes through all the records of your hospital data and follows up on these results.   Also Note the following: If you experience worsening of your admission symptoms, develop shortness of breath, life threatening emergency, suicidal or homicidal thoughts you must seek medical attention immediately by calling 911 or calling your MD immediately  if symptoms less severe.   You must read complete instructions/literature along with all the possible adverse reactions/side effects for all the Medicines you take and that have been prescribed to you. Take any new Medicines after you have completely understood and accpet all the possible adverse reactions/side effects.    Do not drive when taking Pain medications or sleeping medications (Benzodaizepines)   Do not take more than prescribed Pain, Sleep and Anxiety Medications. It is not advisable to combine anxiety,sleep and pain medications without talking with your primary care practitioner   Special Instructions: If you have smoked or chewed Tobacco  in  the last 2 yrs please stop smoking, stop any regular Alcohol  and or any Recreational drug use.   Wear Seat belts while driving.   Please note: You were cared for by a hospitalist during your hospital stay. Once you are discharged, your primary care physician will handle any further medical issues. Please note that NO REFILLS for any discharge medications will be authorized once you are discharged, as it is imperative that you return to your primary care physician (or establish a relationship with a primary care physician if you do not have one) for your post hospital discharge needs so that they can reassess your need for medications and monitor your lab values.  Time coordinating discharge: 40 minutes  SIGNED:  Pamella Pert, MD, PhD 03/09/2023, 10:53 AM

## 2023-03-09 NOTE — Progress Notes (Signed)
SLP Cancellation Note  Patient Details Name: Katelyn Smith MRN: [id number] DOB: Jan 08, 1966   Cancelled treatment:       Reason Eval/Treat Not Completed: SLP screened, no needs identified, will sign off. D/C summary placed, all symptoms have resolved. Thank you for this referral,  Valynn Schamberger H. Romie Levee, CCC-SLP Speech Language Pathologist    Georgetta Haber 03/09/2023, 2:36 PM

## 2023-03-09 NOTE — Assessment & Plan Note (Signed)
Resume home oxycodone 15 mg every 8 hours as needed.

## 2023-03-09 NOTE — Progress Notes (Signed)
Patient slept through the night. Patient spouse at the bedside. Patient was up independently in the room  and to the bathroom during the night. NIH scores are 0-2. Plan of care ongoing.

## 2023-03-09 NOTE — Assessment & Plan Note (Addendum)
Presenting with left-sided numbness that has resolved.  No other focal neurologic deficits history of stroke 2020, received tPA, no residual deficits.  Head CT today - Probable acute infarct in the right parietal lobe.  Ongoing tobacco abuse. -Evaluated by teleneurologist, Dr. Eduardo Osier is not fully convinced about CT findings, subsequent MRI-no evidence of acute intracranial abnormality.  CTA head and neck-no intracranial large vessel occlusion, limited view of left common carotid artery, but otherwise patent carotid arteries without stenosis. -Continue home Plavix, add aspirin 81 mg DAPT - HgbA1c, Lipid panel - Continue home statin -Echocardiogram

## 2023-03-09 NOTE — Discharge Instructions (Signed)
Follow with Elfredia Nevins, MD in 5-7 days  Please get a complete blood count and chemistry panel checked by your Primary MD at your next visit, and again as instructed by your Primary MD. Please get your medications reviewed and adjusted by your Primary MD.  Please request your Primary MD to go over all Hospital Tests and Procedure/Radiological results at the follow up, please get all Hospital records sent to your Prim MD by signing hospital release before you go home.  In some cases, there will be blood work, cultures and biopsy results pending at the time of your discharge. Please request that your primary care M.D. goes through all the records of your hospital data and follows up on these results.  If you had Pneumonia of Lung problems at the Hospital: Please get a 2 view Chest X ray done in 6-8 weeks after hospital discharge or sooner if instructed by your Primary MD.  If you have Congestive Heart Failure: Please call your Cardiologist or Primary MD anytime you have any of the following symptoms:  1) 3 pound weight gain in 24 hours or 5 pounds in 1 week  2) shortness of breath, with or without a dry hacking cough  3) swelling in the hands, feet or stomach  4) if you have to sleep on extra pillows at night in order to breathe  Follow cardiac low salt diet and 1.5 lit/day fluid restriction.  If you have diabetes Accuchecks 4 times/day, Once in AM empty stomach and then before each meal. Log in all results and show them to your primary doctor at your next visit. If any glucose reading is under 80 or above 300 call your primary MD immediately.  If you have Seizure/Convulsions/Epilepsy: Please do not drive, operate heavy machinery, participate in activities at heights or participate in high speed sports until you have seen by Primary MD or a Neurologist and advised to do so again. Per [facility] statutes, patients with seizures are not allowed to drive until they have been  seizure-free for six months.  Use caution when using heavy equipment or power tools. Avoid working on ladders or at heights. Take showers instead of baths. Ensure the water temperature is not too high on the home water heater. Do not go swimming alone. Do not lock yourself in a room alone (i.e. bathroom). When caring for infants or small children, sit down when holding, feeding, or changing them to minimize risk of injury to the child in the event you have a seizure. Maintain good sleep hygiene. Avoid alcohol.   If you had Gastrointestinal Bleeding: Please ask your Primary MD to check a complete blood count within one week of discharge or at your next visit. Your endoscopic/colonoscopic biopsies that are pending at the time of discharge, will also need to followed by your Primary MD.  Get Medicines reviewed and adjusted. Please take all your medications with you for your next visit with your Primary MD  Please request your Primary MD to go over all hospital tests and procedure/radiological results at the follow up, please ask your Primary MD to get all Hospital records sent to his/her office.  If you experience worsening of your admission symptoms, develop shortness of breath, life threatening emergency, suicidal or homicidal thoughts you must seek medical attention immediately by calling 911 or calling your MD immediately  if symptoms less severe.  You must read complete instructions/literature along with all the possible adverse reactions/side effects for all the Medicines you take  and that have been prescribed to you. Take any new Medicines after you have completely understood and accpet all the possible adverse reactions/side effects.   Do not drive or operate heavy machinery when taking Pain medications.   Do not take more than prescribed Pain, Sleep and Anxiety Medications  Special Instructions: If you have smoked or chewed Tobacco  in the last 2 yrs please stop smoking, stop any regular  Alcohol  and or any Recreational drug use.  Wear Seat belts while driving.  Please note You were cared for by a hospitalist during your hospital stay. If you have any questions about your discharge medications or the care you received while you were in the hospital after you are discharged, you can call the unit and asked to speak with the hospitalist on call if the hospitalist that took care of you is not available. Once you are discharged, your primary care physician will handle any further medical issues. Please note that NO REFILLS for any discharge medications will be authorized once you are discharged, as it is imperative that you return to your primary care physician (or establish a relationship with a primary care physician if you do not have one) for your aftercare needs so that they can reassess your need for medications and monitor your lab values.  You can reach the hospitalist office at phone [phone] or fax [phone]   If you do not have a primary care physician, you can call [phone] for a physician referral.  Activity: As tolerated with Full fall precautions use walker/cane & assistance as needed    Diet: heart healthy  Disposition Home

## 2023-03-10 LAB — HEMOGLOBIN A1C
Hgb A1c MFr Bld: 5.7 % — ABNORMAL HIGH (ref 4.8–5.6)
Mean Plasma Glucose: 117 mg/dL

## 2023-03-12 DIAGNOSIS — G459 Transient cerebral ischemic attack, unspecified: Secondary | ICD-10-CM | POA: Diagnosis not present

## 2023-03-12 DIAGNOSIS — I6930 Unspecified sequelae of cerebral infarction: Secondary | ICD-10-CM | POA: Diagnosis not present

## 2023-03-12 DIAGNOSIS — M47816 Spondylosis without myelopathy or radiculopathy, lumbar region: Secondary | ICD-10-CM | POA: Diagnosis not present

## 2023-03-12 DIAGNOSIS — I700 Atherosclerosis of aorta: Secondary | ICD-10-CM | POA: Diagnosis not present

## 2023-03-12 DIAGNOSIS — F1120 Opioid dependence, uncomplicated: Secondary | ICD-10-CM | POA: Diagnosis not present

## 2023-03-12 DIAGNOSIS — Z681 Body mass index (BMI) 19 or less, adult: Secondary | ICD-10-CM | POA: Diagnosis not present

## 2023-03-12 DIAGNOSIS — I10 Essential (primary) hypertension: Secondary | ICD-10-CM | POA: Diagnosis not present

## 2023-03-12 DIAGNOSIS — G894 Chronic pain syndrome: Secondary | ICD-10-CM | POA: Diagnosis not present

## 2023-03-26 DIAGNOSIS — F1411 Cocaine abuse, in remission: Secondary | ICD-10-CM | POA: Diagnosis not present

## 2023-06-21 DIAGNOSIS — Z681 Body mass index (BMI) 19 or less, adult: Secondary | ICD-10-CM | POA: Diagnosis not present

## 2023-06-21 DIAGNOSIS — F1120 Opioid dependence, uncomplicated: Secondary | ICD-10-CM | POA: Diagnosis not present

## 2023-06-21 DIAGNOSIS — G894 Chronic pain syndrome: Secondary | ICD-10-CM | POA: Diagnosis not present

## 2023-06-21 DIAGNOSIS — M47816 Spondylosis without myelopathy or radiculopathy, lumbar region: Secondary | ICD-10-CM | POA: Diagnosis not present

## 2023-06-21 DIAGNOSIS — N10 Acute tubulo-interstitial nephritis: Secondary | ICD-10-CM | POA: Diagnosis not present

## 2023-06-21 DIAGNOSIS — F332 Major depressive disorder, recurrent severe without psychotic features: Secondary | ICD-10-CM | POA: Diagnosis not present

## 2023-06-21 DIAGNOSIS — I10 Essential (primary) hypertension: Secondary | ICD-10-CM | POA: Diagnosis not present

## 2023-07-09 DIAGNOSIS — Z7902 Long term (current) use of antithrombotics/antiplatelets: Secondary | ICD-10-CM | POA: Diagnosis not present

## 2023-07-09 DIAGNOSIS — M1990 Unspecified osteoarthritis, unspecified site: Secondary | ICD-10-CM | POA: Diagnosis not present

## 2023-07-09 DIAGNOSIS — F325 Major depressive disorder, single episode, in full remission: Secondary | ICD-10-CM | POA: Diagnosis not present

## 2023-07-09 DIAGNOSIS — Z809 Family history of malignant neoplasm, unspecified: Secondary | ICD-10-CM | POA: Diagnosis not present

## 2023-07-09 DIAGNOSIS — I2510 Atherosclerotic heart disease of native coronary artery without angina pectoris: Secondary | ICD-10-CM | POA: Diagnosis not present

## 2023-07-09 DIAGNOSIS — R32 Unspecified urinary incontinence: Secondary | ICD-10-CM | POA: Diagnosis not present

## 2023-07-09 DIAGNOSIS — F1421 Cocaine dependence, in remission: Secondary | ICD-10-CM | POA: Diagnosis not present

## 2023-07-09 DIAGNOSIS — F17210 Nicotine dependence, cigarettes, uncomplicated: Secondary | ICD-10-CM | POA: Diagnosis not present

## 2023-07-09 DIAGNOSIS — Z8673 Personal history of transient ischemic attack (TIA), and cerebral infarction without residual deficits: Secondary | ICD-10-CM | POA: Diagnosis not present

## 2023-07-09 DIAGNOSIS — Z8249 Family history of ischemic heart disease and other diseases of the circulatory system: Secondary | ICD-10-CM | POA: Diagnosis not present

## 2023-07-09 DIAGNOSIS — E785 Hyperlipidemia, unspecified: Secondary | ICD-10-CM | POA: Diagnosis not present

## 2023-07-09 DIAGNOSIS — Z7982 Long term (current) use of aspirin: Secondary | ICD-10-CM | POA: Diagnosis not present

## 2023-07-22 DIAGNOSIS — G894 Chronic pain syndrome: Secondary | ICD-10-CM | POA: Diagnosis not present

## 2023-07-22 DIAGNOSIS — F332 Major depressive disorder, recurrent severe without psychotic features: Secondary | ICD-10-CM | POA: Diagnosis not present

## 2023-07-22 DIAGNOSIS — I10 Essential (primary) hypertension: Secondary | ICD-10-CM | POA: Diagnosis not present

## 2023-07-22 DIAGNOSIS — B3731 Acute candidiasis of vulva and vagina: Secondary | ICD-10-CM | POA: Diagnosis not present

## 2024-06-07 ENCOUNTER — Encounter (HOSPITAL_COMMUNITY): Payer: Self-pay

## 2024-06-07 ENCOUNTER — Emergency Department (HOSPITAL_COMMUNITY)
Admission: EM | Admit: 2024-06-07 | Discharge: 2024-06-07 | Disposition: A | Discharge status: Home / Self Care | Attending: Emergency Medicine | Admitting: Emergency Medicine

## 2024-06-07 ENCOUNTER — Other Ambulatory Visit: Payer: Self-pay

## 2024-06-07 DIAGNOSIS — Z7982 Long term (current) use of aspirin: Secondary | ICD-10-CM | POA: Diagnosis not present

## 2024-06-07 DIAGNOSIS — R197 Diarrhea, unspecified: Secondary | ICD-10-CM | POA: Insufficient documentation

## 2024-06-07 DIAGNOSIS — R112 Nausea with vomiting, unspecified: Secondary | ICD-10-CM | POA: Diagnosis present

## 2024-06-07 LAB — CBC
HCT: 42.7 % (ref 36.0–46.0)
Hemoglobin: 14.2 g/dL (ref 12.0–15.0)
MCH: 32.9 pg (ref 26.0–34.0)
MCHC: 33.3 g/dL (ref 30.0–36.0)
MCV: 98.8 fL (ref 80.0–100.0)
Platelets: 257 K/uL (ref 150–400)
RBC: 4.32 MIL/uL (ref 3.87–5.11)
RDW: 12.1 % (ref 11.5–15.5)
WBC: 6.1 K/uL (ref 4.0–10.5)
nRBC: 0 % (ref 0.0–0.2)

## 2024-06-07 LAB — COMPREHENSIVE METABOLIC PANEL WITH GFR
ALT: 12 U/L (ref 0–44)
AST: 16 U/L (ref 15–41)
Albumin: 4.4 g/dL (ref 3.5–5.0)
Alkaline Phosphatase: 86 U/L (ref 38–126)
Anion gap: 13 (ref 5–15)
BUN: 12 mg/dL (ref 6–20)
CO2: 25 mmol/L (ref 22–32)
Calcium: 9.4 mg/dL (ref 8.9–10.3)
Chloride: 106 mmol/L (ref 98–111)
Creatinine, Ser: 0.62 mg/dL (ref 0.44–1.00)
GFR, Estimated: >60 mL/min (ref >60)
Glucose, Bld: 90 mg/dL (ref 70–99)
Potassium: 3.8 mmol/L (ref 3.5–5.1)
Sodium: 144 mmol/L (ref 135–145)
Total Bilirubin: 0.3 mg/dL (ref 0.0–1.2)
Total Protein: 7 g/dL (ref 6.5–8.1)

## 2024-06-07 LAB — LIPASE, BLOOD: Lipase: 20 U/L (ref 11–51)

## 2024-06-07 MED ORDER — ONDANSETRON HCL 4 MG/2ML IJ SOLN: 4.0000 mg | Freq: Once | INTRAMUSCULAR | Status: DC

## 2024-06-07 MED ORDER — SODIUM CHLORIDE 0.9 % IV BOLUS: 1000.0000 mL | Freq: Once | INTRAVENOUS | Status: DC

## 2024-06-07 MED ORDER — ONDANSETRON 4 MG PO TBDP: 4.0000 mg | ORAL_TABLET | Freq: Three times a day (TID) | ORAL | 0 refills | Status: AC | PRN

## 2024-06-07 NOTE — ED Triage Notes (Signed)
 Patient come in Pov for complaint of Vomiting and diarrhea, that has been going on since 4 am this morning.

## 2024-06-07 NOTE — Discharge Instructions (Signed)

## 2024-06-07 NOTE — ED Provider Notes (Signed)
 [place] EMERGENCY DEPARTMENT AT [facility] Provider Note   CSN: [id number] Arrival date & time: 06/07/24  9076     Patient presents with: Emesis and Diarrhea   Katelyn Smith is a 58 y.o. female.   Patient is a 58 year old female with a chief complaint of nausea, vomiting, diarrhea since early this morning.  Patient notes that she did go to a party on Sunday and is unsure if she may have been exposed to someone who was sick.  She notes that she has had no associate abdominal pain.  She denies any fever or chills.  She has had no chest pain or shortness of breath.  She denies any associated dysuria or hematuria.   Emesis Associated symptoms: diarrhea   Diarrhea Associated symptoms: vomiting        Prior to Admission medications   Medication Sig Start Date End Date Taking? Authorizing Provider  aspirin  EC 81 MG tablet Take 1 tablet (81 mg total) by mouth daily. Swallow whole. 03/10/23   Gherghe, Costin M, MD  atorvastatin  (LIPITOR) 40 MG tablet Take 1 tablet (40 mg total) by mouth daily at 6 PM. 06/27/19   Metzger-Cihelka, Verla, NP  clopidogrel  (PLAVIX ) 75 MG tablet Take 1 tablet (75 mg total) by mouth daily. 06/27/19   Metzger-Cihelka, Desiree, NP  melatonin 5 MG TABS Take 1 tablet (5 mg total) by mouth at bedtime. 08/30/21   McQuilla, Jai B, MD  methocarbamol  (ROBAXIN ) 500 MG tablet Take 1 tablet (500 mg total) by mouth every 8 (eight) hours as needed for muscle spasms. 08/30/21   McQuilla, Jai B, MD  mirtazapine  (REMERON ) 15 MG tablet Take 1 tablet (15 mg total) by mouth at bedtime. 08/30/21   McQuilla, Jai B, MD  naproxen  (NAPROSYN ) 500 MG tablet Take 1 tablet (500 mg total) by mouth 2 (two) times daily as needed (aching, pain, or discomfort). 08/30/21   McQuilla, Jai B, MD  nicotine  (NICODERM CQ  - DOSED IN MG/24 HOURS) 14 mg/24hr patch Place 1 patch (14 mg total) onto the skin daily. 08/31/21   McQuilla, Jai B, MD  traZODone  (DESYREL ) 50 MG tablet Take 1 tablet (50  mg total) by mouth at bedtime. 08/30/21   McQuilla, Jai B, MD  Vitamin D , Ergocalciferol , (DRISDOL ) 1.25 MG (50000 UNIT) CAPS capsule Take 1 capsule (50,000 Units total) by mouth every 7 (seven) days. 09/04/21   McQuilla, Jai B, MD    Allergies: Mirabegron     Review of Systems  Gastrointestinal:  Positive for diarrhea and vomiting.  All other systems reviewed and are negative.   Updated Vital Signs BP (!) 159/94 (BP Location: Left Arm)   Pulse 81   Temp 98.2 F (36.8 C) (Oral)   Resp 17   Ht 5' 3 (1.6 m)   Wt 50.8 kg   SpO2 97%   BMI 19.84 kg/m   Physical Exam Vitals and nursing note reviewed.  Constitutional:      General: She is not in acute distress.    Appearance: Normal appearance. She is not ill-appearing.  HENT:     Head: Normocephalic and atraumatic.     Nose: Nose normal.     Mouth/Throat:     Mouth: Mucous membranes are moist.  Eyes:     Extraocular Movements: Extraocular movements intact.     Conjunctiva/sclera: Conjunctivae normal.     Pupils: Pupils are equal, round, and reactive to light.  Cardiovascular:     Rate and Rhythm: Normal rate and regular rhythm.  Pulses: Normal pulses.     Heart sounds: Normal heart sounds. No murmur heard.    No gallop.  Pulmonary:     Effort: Pulmonary effort is normal. No respiratory distress.     Breath sounds: Normal breath sounds. No stridor. No wheezing, rhonchi or rales.  Abdominal:     General: Abdomen is flat. Bowel sounds are normal. There is no distension.     Palpations: Abdomen is soft.     Tenderness: There is no abdominal tenderness. There is no guarding.  Musculoskeletal:        General: Normal range of motion.     Cervical back: Normal range of motion and neck supple. No rigidity or tenderness.     Right lower leg: No edema.     Left lower leg: No edema.  Skin:    General: Skin is warm and dry.     Findings: No bruising or rash.  Neurological:     General: No focal deficit present.     Mental  Status: She is alert and oriented to person, place, and time. Mental status is at baseline.     Cranial Nerves: No cranial nerve deficit.     Sensory: No sensory deficit.     Motor: No weakness.     Coordination: Coordination normal.     Gait: Gait normal.  Psychiatric:        Mood and Affect: Mood normal.        Behavior: Behavior normal.        Thought Content: Thought content normal.        Judgment: Judgment normal.     (all labs ordered are listed, but only abnormal results are displayed) Labs Reviewed  CBC  LIPASE, BLOOD  COMPREHENSIVE METABOLIC PANEL WITH GFR  URINALYSIS, ROUTINE W REFLEX MICROSCOPIC    EKG: None  Radiology: No results found.   Procedures   Medications Ordered in the ED  ondansetron  (ZOFRAN ) injection 4 mg (has no administration in time range)  sodium chloride  0.9 % bolus 1,000 mL (has no administration in time range)                                    Medical Decision Making Amount and/or Complexity of Data Reviewed Labs: ordered.  Risk Prescription drug management.   This patient presents to the ED for concern of nausea, vomiting, diarrhea differential diagnosis includes gastroenteritis, acute appendicitis, cholecystitis, spinal infection, diverticulitis, pyelonephritis, kidney stone, pancreatitis, mesenteric ischemia    Additional history obtained:  Additional history obtained from none External records from outside source obtained and reviewed including none   Lab Tests:  I Ordered, and personally interpreted labs.  The pertinent results include: No leukocytosis, no anemia, normal kidney function liver function, normal electrolytes, negative lipase    Problem List / ED Course:  Patient is feeling back to her baseline at this time and would like to be discharged home.  Did offer treatment in the emergency department that she has declined at this point stating that she feels completely better.  Abdominal exam is benign with no  focal tenderness throughout do not suspect any acute surgical process.  Suspect that symptoms are secondary to viral gastroenteritis at this point.  Will call in antiemetics as needed.  Close follow-up with PCP was discussed as well as strict turn precautions for any new or worsening symptoms.  Patient voiced understanding and had no additional  questions.   Social Determinants of Health:  None        Final diagnoses:  None    ED Discharge Orders     None          Daralene Lonni JONETTA DEVONNA 06/07/24 1138    Suzette Pac, MD 06/08/24 1057

## 2024-06-22 ENCOUNTER — Encounter (HOSPITAL_COMMUNITY): Payer: Self-pay

## 2024-06-22 ENCOUNTER — Other Ambulatory Visit: Payer: Self-pay

## 2024-06-22 ENCOUNTER — Emergency Department (HOSPITAL_COMMUNITY)
Admission: EM | Admit: 2024-06-22 | Discharge: 2024-06-22 | Disposition: A | Discharge status: Home / Self Care | Attending: Emergency Medicine | Admitting: Emergency Medicine

## 2024-06-22 DIAGNOSIS — F17200 Nicotine dependence, unspecified, uncomplicated: Secondary | ICD-10-CM | POA: Insufficient documentation

## 2024-06-22 DIAGNOSIS — Z7902 Long term (current) use of antithrombotics/antiplatelets: Secondary | ICD-10-CM | POA: Diagnosis not present

## 2024-06-22 DIAGNOSIS — Z7982 Long term (current) use of aspirin: Secondary | ICD-10-CM | POA: Diagnosis not present

## 2024-06-22 DIAGNOSIS — Z0279 Encounter for issue of other medical certificate: Secondary | ICD-10-CM | POA: Diagnosis present

## 2024-06-22 DIAGNOSIS — Z008 Encounter for other general examination: Secondary | ICD-10-CM

## 2024-06-22 MED ORDER — ATORVASTATIN CALCIUM 40 MG PO TABS: 40.0000 mg | ORAL_TABLET | Freq: Every day | ORAL | 3 refills | Status: AC

## 2024-06-22 MED ORDER — CLOPIDOGREL BISULFATE 75 MG PO TABS: 75.0000 mg | ORAL_TABLET | Freq: Every day | ORAL | 3 refills | Status: AC

## 2024-06-22 NOTE — Discharge Instructions (Signed)
 You are medically cleared for placement at substance abuse treatment center if needed.

## 2024-06-22 NOTE — ED Provider Notes (Signed)
 [place] EMERGENCY DEPARTMENT AT [facility] Provider Note   CSN: [id number] Arrival date & time: 06/22/24  9351     Patient presents with: Medical Clearance   Katelyn Smith is a 58 y.o. female.   HPI Patient presents with medical clearance.  States she needs medical clearance to go to [place].  States that she recently had a positive drug screen for cocaine.  States that she had touched some that had been in her drawer but she did not use any.  States that a week line came up so she had to get more placement.  Denies using since June.  Denies suicidal or homicidal thoughts.  States she also needs her cholesterol medicine and her blood thinner refilled.   Past Medical History:  Diagnosis Date   Anxiety    Chest pain    a. 07/2016: Stress test showing no evidence of ischemia. b. cath 08/2016: 40% Ost RCA stenosis, 70% Lateral 3rd Mrg, and 80% Acute Mrg (too small for intervention, therefore medical therapy recommended).    Chronic back pain    Chronic neck pain    Depression    H/O degenerative disc disease    High cholesterol    Lumbar radiculopathy, right    Stroke (HCC) 10/2019   Tobacco abuse    Past Surgical History:  Procedure Laterality Date   BACK SURGERY     CARDIAC CATHETERIZATION N/A 09/09/2016   Procedure: Left Heart Cath and Coronary Angiography;  Surgeon: Peter M [place], MD;  Location: [facility] INVASIVE CV LAB;  Service: Cardiovascular;  Laterality: N/A;   CARDIAC CATHETERIZATION  09/09/2016   TUBAL LIGATION       Prior to Admission medications   Medication Sig Start Date End Date Taking? Authorizing Provider  aspirin  EC 81 MG tablet Take 1 tablet (81 mg total) by mouth daily. Swallow whole. 03/10/23   Gherghe, Costin M, MD  atorvastatin  (LIPITOR) 40 MG tablet Take 1 tablet (40 mg total) by mouth daily at 6 PM. 06/22/24   Patsey Lot, MD  clopidogrel  (PLAVIX ) 75 MG tablet Take 1 tablet (75 mg total) by mouth daily. 06/22/24   Patsey Lot,  MD  melatonin 5 MG TABS Take 1 tablet (5 mg total) by mouth at bedtime. 08/30/21   McQuilla, Jai B, MD  methocarbamol  (ROBAXIN ) 500 MG tablet Take 1 tablet (500 mg total) by mouth every 8 (eight) hours as needed for muscle spasms. 08/30/21   McQuilla, Jai B, MD  mirtazapine  (REMERON ) 15 MG tablet Take 1 tablet (15 mg total) by mouth at bedtime. 08/30/21   McQuilla, Jai B, MD  naproxen  (NAPROSYN ) 500 MG tablet Take 1 tablet (500 mg total) by mouth 2 (two) times daily as needed (aching, pain, or discomfort). 08/30/21   McQuilla, Jai B, MD  nicotine  (NICODERM CQ  - DOSED IN MG/24 HOURS) 14 mg/24hr patch Place 1 patch (14 mg total) onto the skin daily. 08/31/21   McQuilla, Jai B, MD  ondansetron  (ZOFRAN -ODT) 4 MG disintegrating tablet Take 1 tablet (4 mg total) by mouth every 8 (eight) hours as needed for nausea or vomiting. 06/07/24   Daralene Lonni BIRCH, PA-C  traZODone  (DESYREL ) 50 MG tablet Take 1 tablet (50 mg total) by mouth at bedtime. 08/30/21   McQuilla, Jai B, MD  Vitamin D , Ergocalciferol , (DRISDOL ) 1.25 MG (50000 UNIT) CAPS capsule Take 1 capsule (50,000 Units total) by mouth every 7 (seven) days. 09/04/21   McQuilla, Jai B, MD    Allergies: Mirabegron   Review of Systems  Updated Vital Signs BP (!) 146/83 (BP Location: Left Arm)   Pulse 82   Temp 98 F (36.7 C) (Oral)   Resp 16   Ht 5' 3 (1.6 m)   Wt 54.4 kg   SpO2 96%   BMI 21.26 kg/m   Physical Exam Vitals and nursing note reviewed.  Cardiovascular:     Rate and Rhythm: Regular rhythm.  Pulmonary:     Effort: Pulmonary effort is normal.  Neurological:     Mental Status: She is alert. Mental status is at baseline.     (all labs ordered are listed, but only abnormal results are displayed) Labs Reviewed - No data to display  EKG: None  Radiology: No results found.   Procedures   Medications Ordered in the ED - No data to display                                  Medical Decision Making Risk Prescription  drug management.   Patient presents for reported medical clearance.  States that she has not used cocaine since June but did touch some and it went through her skin.  States she needs medical clearance so she can go to [place].  Reviewing notes it appears that 3 days ago she was seen with a similar story at [facility].  At that point it appears that she or someone else admitted that she had used $20 of cocaine after the positive drug screen because she was upset.  However reviewing blood work from there it appears there is no significant abnormality besides a positive urine drug screen for cocaine.  At this point patient is medically cleared.  Do not think we need further workup at this time.  Have refilled her cholesterol medicine and her Plavix .     Final diagnoses:  Medical clearance for psychiatric admission    ED Discharge Orders          Ordered    atorvastatin  (LIPITOR) 40 MG tablet  Daily-1800        06/22/24 0748    clopidogrel  (PLAVIX ) 75 MG tablet  Daily        06/22/24 0748               Patsey Lot, MD 06/22/24 [phone]

## 2024-06-22 NOTE — ED Triage Notes (Signed)
 Pt came in POV stating she needs a medical clearance to go to rehab. She states her doctor retired so she was told to come here. Pt states that she takes a blood thinner and a cholesterol med that needs to be refilled.

## 2024-06-23 ENCOUNTER — Encounter (HOSPITAL_COMMUNITY): Payer: Self-pay | Admitting: Emergency Medicine

## 2024-06-23 ENCOUNTER — Emergency Department (HOSPITAL_COMMUNITY)
Admission: EM | Admit: 2024-06-23 | Discharge: 2024-06-23 | Discharge status: Against Medical Advice | Attending: Emergency Medicine | Admitting: Emergency Medicine

## 2024-06-23 ENCOUNTER — Other Ambulatory Visit: Payer: Self-pay

## 2024-06-23 DIAGNOSIS — Z5321 Procedure and treatment not carried out due to patient leaving prior to being seen by health care provider: Secondary | ICD-10-CM | POA: Insufficient documentation

## 2024-06-23 DIAGNOSIS — I10 Essential (primary) hypertension: Secondary | ICD-10-CM | POA: Diagnosis present

## 2024-06-23 NOTE — ED Provider Notes (Signed)
 LWBS after triage. I did not see or participate in the care of this patient.   Franklyn Sid SAILOR, MD 06/23/24 2226

## 2024-06-23 NOTE — ED Triage Notes (Signed)
 Pt arrived POV c/o hypertension for the past few days. Pt states she was seen in [facility] on Monday and her BP was elevated, and then pt was seen here yesterday and she noted her BP was increased again.
